# Patient Record
Sex: Male | Born: 1955
Health system: Southern US, Community
[De-identification: ages and names within clinical notes are randomized; demographics above are authoritative.]

## PROBLEM LIST (undated history)

## (undated) DIAGNOSIS — I10 Essential (primary) hypertension: Secondary | ICD-10-CM

## (undated) DIAGNOSIS — R739 Hyperglycemia, unspecified: Secondary | ICD-10-CM

## (undated) DIAGNOSIS — K589 Irritable bowel syndrome without diarrhea: Secondary | ICD-10-CM

## (undated) DIAGNOSIS — N400 Enlarged prostate without lower urinary tract symptoms: Secondary | ICD-10-CM

## (undated) DIAGNOSIS — K219 Gastro-esophageal reflux disease without esophagitis: Secondary | ICD-10-CM

## (undated) DIAGNOSIS — M1990 Unspecified osteoarthritis, unspecified site: Secondary | ICD-10-CM

## (undated) DIAGNOSIS — E785 Hyperlipidemia, unspecified: Secondary | ICD-10-CM

## (undated) DIAGNOSIS — Z87442 Personal history of urinary calculi: Secondary | ICD-10-CM

## (undated) DIAGNOSIS — M549 Dorsalgia, unspecified: Secondary | ICD-10-CM

## (undated) DIAGNOSIS — K5792 Diverticulitis of intestine, part unspecified, without perforation or abscess without bleeding: Secondary | ICD-10-CM

## (undated) DIAGNOSIS — G8929 Other chronic pain: Secondary | ICD-10-CM

## (undated) HISTORY — DX: Benign prostatic hyperplasia without lower urinary tract symptoms: N40.0

## (undated) HISTORY — DX: Other chronic pain: G89.29

## (undated) HISTORY — PX: CHOLECYSTECTOMY: SHX55

## (undated) HISTORY — DX: Hyperglycemia, unspecified: R73.9

## (undated) HISTORY — DX: Essential (primary) hypertension: I10

## (undated) HISTORY — DX: Personal history of urinary calculi: Z87.442

## (undated) HISTORY — DX: Dorsalgia, unspecified: M54.9

## (undated) HISTORY — DX: Irritable bowel syndrome, unspecified: K58.9

## (undated) HISTORY — DX: Diverticulitis of intestine, part unspecified, without perforation or abscess without bleeding: K57.92

## (undated) HISTORY — DX: Hyperlipidemia, unspecified: E78.5

---

## 1988-05-07 HISTORY — PX: VASECTOMY: SHX75

## 1997-05-24 ENCOUNTER — Encounter: Payer: Self-pay | Admitting: Family Medicine

## 1997-05-24 LAB — CONVERTED CEMR LAB: Blood Glucose, Fasting: 100 mg/dL

## 2002-07-13 ENCOUNTER — Encounter: Payer: Self-pay | Admitting: Family Medicine

## 2002-07-13 LAB — CONVERTED CEMR LAB
PSA: 0.6 ng/mL
RBC count: 4.73 10*6/uL
TSH: 1.5 microintl units/mL
WBC, blood: 4.6 10*3/uL

## 2004-08-07 ENCOUNTER — Ambulatory Visit: Payer: Self-pay | Admitting: Family Medicine

## 2004-08-07 LAB — CONVERTED CEMR LAB: TSH: 1.75 microintl units/mL

## 2004-08-09 ENCOUNTER — Ambulatory Visit: Payer: Self-pay | Admitting: Family Medicine

## 2004-08-23 ENCOUNTER — Ambulatory Visit: Payer: Self-pay | Admitting: Gastroenterology

## 2004-09-01 ENCOUNTER — Ambulatory Visit: Payer: Self-pay | Admitting: Gastroenterology

## 2005-02-07 ENCOUNTER — Emergency Department: Payer: Self-pay | Admitting: Emergency Medicine

## 2005-02-07 ENCOUNTER — Encounter: Payer: Self-pay | Admitting: Family Medicine

## 2005-02-07 DIAGNOSIS — Z87442 Personal history of urinary calculi: Secondary | ICD-10-CM

## 2005-02-07 HISTORY — DX: Personal history of urinary calculi: Z87.442

## 2005-02-07 LAB — CONVERTED CEMR LAB: RBC count: 4.64 10*6/uL

## 2005-02-13 ENCOUNTER — Ambulatory Visit: Payer: Self-pay | Admitting: Family Medicine

## 2005-07-09 ENCOUNTER — Ambulatory Visit: Payer: Self-pay | Admitting: Family Medicine

## 2005-07-18 ENCOUNTER — Ambulatory Visit: Payer: Self-pay | Admitting: Family Medicine

## 2005-10-29 ENCOUNTER — Ambulatory Visit: Payer: Self-pay | Admitting: Family Medicine

## 2005-12-20 ENCOUNTER — Ambulatory Visit: Payer: Self-pay | Admitting: Family Medicine

## 2006-04-02 ENCOUNTER — Ambulatory Visit: Payer: Self-pay | Admitting: Family Medicine

## 2006-04-16 ENCOUNTER — Ambulatory Visit: Payer: Self-pay | Admitting: Family Medicine

## 2006-04-16 LAB — CONVERTED CEMR LAB: Blood Glucose, Fasting: 102 mg/dL

## 2006-04-19 ENCOUNTER — Ambulatory Visit: Payer: Self-pay | Admitting: Family Medicine

## 2006-05-09 ENCOUNTER — Ambulatory Visit: Payer: Self-pay | Admitting: Family Medicine

## 2006-12-19 ENCOUNTER — Telehealth (INDEPENDENT_AMBULATORY_CARE_PROVIDER_SITE_OTHER): Payer: Self-pay | Admitting: *Deleted

## 2007-01-01 ENCOUNTER — Ambulatory Visit: Payer: Self-pay | Admitting: Family Medicine

## 2007-01-01 DIAGNOSIS — M545 Low back pain, unspecified: Secondary | ICD-10-CM | POA: Insufficient documentation

## 2007-04-16 ENCOUNTER — Telehealth: Payer: Self-pay | Admitting: Family Medicine

## 2007-04-17 ENCOUNTER — Ambulatory Visit: Payer: Self-pay | Admitting: Family Medicine

## 2007-07-15 ENCOUNTER — Telehealth: Payer: Self-pay | Admitting: Internal Medicine

## 2007-09-09 ENCOUNTER — Telehealth: Payer: Self-pay | Admitting: Family Medicine

## 2007-10-07 ENCOUNTER — Ambulatory Visit: Payer: Self-pay | Admitting: Family Medicine

## 2007-10-07 LAB — CONVERTED CEMR LAB
Bilirubin, Direct: 0.1 mg/dL (ref 0.0–0.3)
Calcium: 9.3 mg/dL (ref 8.4–10.5)
GFR calc Af Amer: 100 mL/min
GFR calc non Af Amer: 83 mL/min
HDL: 35.2 mg/dL — ABNORMAL LOW (ref >39.0)
PSA: 1.02 ng/mL (ref 0.10–4.00)
Sodium: 140 meq/L (ref 135–145)
Total Bilirubin: 0.9 mg/dL (ref 0.3–1.2)
Total CHOL/HDL Ratio: 5.7
Total Protein: 6.8 g/dL (ref 6.0–8.3)
Triglycerides: 64 mg/dL (ref 0–149)
VLDL: 13 mg/dL (ref 0–40)

## 2007-10-08 ENCOUNTER — Encounter: Payer: Self-pay | Admitting: Family Medicine

## 2007-10-08 ENCOUNTER — Ambulatory Visit: Payer: Self-pay | Admitting: Family Medicine

## 2007-10-08 DIAGNOSIS — K5730 Diverticulosis of large intestine without perforation or abscess without bleeding: Secondary | ICD-10-CM | POA: Insufficient documentation

## 2007-10-08 DIAGNOSIS — D126 Benign neoplasm of colon, unspecified: Secondary | ICD-10-CM | POA: Insufficient documentation

## 2007-10-08 DIAGNOSIS — R739 Hyperglycemia, unspecified: Secondary | ICD-10-CM | POA: Insufficient documentation

## 2007-10-24 ENCOUNTER — Ambulatory Visit: Payer: Self-pay | Admitting: Family Medicine

## 2007-11-03 LAB — FECAL OCCULT BLOOD, GUAIAC: Fecal Occult Blood: NEGATIVE

## 2007-11-04 ENCOUNTER — Encounter (INDEPENDENT_AMBULATORY_CARE_PROVIDER_SITE_OTHER): Payer: Self-pay | Admitting: *Deleted

## 2007-11-05 ENCOUNTER — Telehealth: Payer: Self-pay | Admitting: Family Medicine

## 2007-12-17 ENCOUNTER — Telehealth: Payer: Self-pay | Admitting: Family Medicine

## 2008-01-30 ENCOUNTER — Telehealth: Payer: Self-pay | Admitting: Family Medicine

## 2008-02-10 ENCOUNTER — Ambulatory Visit: Payer: Self-pay | Admitting: Family Medicine

## 2008-03-16 ENCOUNTER — Telehealth: Payer: Self-pay | Admitting: Family Medicine

## 2008-05-26 ENCOUNTER — Telehealth: Payer: Self-pay | Admitting: Family Medicine

## 2008-07-28 ENCOUNTER — Telehealth: Payer: Self-pay | Admitting: Family Medicine

## 2008-09-03 ENCOUNTER — Telehealth: Payer: Self-pay | Admitting: Family Medicine

## 2008-10-06 ENCOUNTER — Ambulatory Visit: Payer: Self-pay | Admitting: Family Medicine

## 2008-10-06 DIAGNOSIS — E785 Hyperlipidemia, unspecified: Secondary | ICD-10-CM | POA: Insufficient documentation

## 2008-10-07 ENCOUNTER — Encounter: Payer: Self-pay | Admitting: Family Medicine

## 2008-10-07 LAB — CONVERTED CEMR LAB
ALT: 17 units/L (ref 0–53)
AST: 17 units/L (ref 0–37)
Alkaline Phosphatase: 39 units/L (ref 39–117)
BUN: 16 mg/dL (ref 6–23)
Bilirubin, Direct: 0 mg/dL (ref 0.0–0.3)
Cholesterol: 185 mg/dL (ref 0–200)
Creatinine, Ser: 0.8 mg/dL (ref 0.4–1.5)
Eosinophils Relative: 2 % (ref 0.0–5.0)
GFR calc non Af Amer: 106.64 mL/min (ref >60)
HCT: 42.4 % (ref 39.0–52.0)
LDL Cholesterol: 131 mg/dL — ABNORMAL HIGH (ref 0–99)
Monocytes Relative: 8.2 % (ref 3.0–12.0)
Neutrophils Relative %: 51.2 % (ref 43.0–77.0)
Platelets: 213 10*3/uL (ref 150.0–400.0)
Potassium: 4.1 meq/L (ref 3.5–5.1)
Total Bilirubin: 0.9 mg/dL (ref 0.3–1.2)
VLDL: 14.2 mg/dL (ref 0.0–40.0)
WBC: 5.7 10*3/uL (ref 4.5–10.5)

## 2008-10-14 ENCOUNTER — Ambulatory Visit: Payer: Self-pay | Admitting: Family Medicine

## 2008-10-14 DIAGNOSIS — Z87898 Personal history of other specified conditions: Secondary | ICD-10-CM | POA: Insufficient documentation

## 2008-11-22 ENCOUNTER — Emergency Department: Payer: Self-pay | Admitting: Emergency Medicine

## 2008-11-23 ENCOUNTER — Telehealth: Payer: Self-pay | Admitting: Family Medicine

## 2008-11-23 DIAGNOSIS — N200 Calculus of kidney: Secondary | ICD-10-CM | POA: Insufficient documentation

## 2008-11-24 ENCOUNTER — Encounter: Payer: Self-pay | Admitting: Family Medicine

## 2008-12-01 ENCOUNTER — Ambulatory Visit: Payer: Self-pay | Admitting: Family Medicine

## 2008-12-17 ENCOUNTER — Encounter: Payer: Self-pay | Admitting: Family Medicine

## 2008-12-27 ENCOUNTER — Ambulatory Visit: Payer: Self-pay | Admitting: General Surgery

## 2008-12-27 ENCOUNTER — Encounter: Payer: Self-pay | Admitting: Family Medicine

## 2008-12-27 HISTORY — PX: LAPAROSCOPIC CHOLECYSTECTOMY W/ CHOLANGIOGRAPHY: SUR757

## 2009-01-11 ENCOUNTER — Encounter: Payer: Self-pay | Admitting: Family Medicine

## 2009-02-24 ENCOUNTER — Telehealth: Payer: Self-pay | Admitting: Family Medicine

## 2009-03-28 ENCOUNTER — Ambulatory Visit: Payer: Self-pay | Admitting: Family Medicine

## 2009-04-07 ENCOUNTER — Telehealth: Payer: Self-pay | Admitting: Family Medicine

## 2009-04-27 ENCOUNTER — Telehealth: Payer: Self-pay | Admitting: Family Medicine

## 2009-04-27 ENCOUNTER — Ambulatory Visit: Payer: Self-pay | Admitting: Family Medicine

## 2009-05-30 ENCOUNTER — Telehealth: Payer: Self-pay | Admitting: Family Medicine

## 2009-06-21 ENCOUNTER — Telehealth: Payer: Self-pay | Admitting: Family Medicine

## 2009-07-26 ENCOUNTER — Telehealth: Payer: Self-pay | Admitting: Internal Medicine

## 2009-08-16 ENCOUNTER — Encounter (INDEPENDENT_AMBULATORY_CARE_PROVIDER_SITE_OTHER): Payer: Self-pay | Admitting: *Deleted

## 2009-08-31 ENCOUNTER — Telehealth: Payer: Self-pay | Admitting: Family Medicine

## 2009-09-02 ENCOUNTER — Encounter (INDEPENDENT_AMBULATORY_CARE_PROVIDER_SITE_OTHER): Payer: Self-pay | Admitting: *Deleted

## 2009-09-26 ENCOUNTER — Telehealth: Payer: Self-pay | Admitting: Family Medicine

## 2009-09-28 ENCOUNTER — Encounter: Payer: Self-pay | Admitting: Family Medicine

## 2009-10-10 ENCOUNTER — Ambulatory Visit: Payer: Self-pay | Admitting: Family Medicine

## 2009-10-11 LAB — CONVERTED CEMR LAB
Alkaline Phosphatase: 43 units/L (ref 39–117)
Basophils Relative: 0.9 % (ref 0.0–3.0)
Bilirubin, Direct: 0.1 mg/dL (ref 0.0–0.3)
CO2: 30 meq/L (ref 19–32)
Calcium: 9 mg/dL (ref 8.4–10.5)
Cholesterol: 201 mg/dL — ABNORMAL HIGH (ref 0–200)
Creatinine, Ser: 0.8 mg/dL (ref 0.4–1.5)
Direct LDL: 141.3 mg/dL
Eosinophils Absolute: 0.1 10*3/uL (ref 0.0–0.7)
Lymphocytes Relative: 33.7 % (ref 12.0–46.0)
MCHC: 34.4 g/dL (ref 30.0–36.0)
Neutrophils Relative %: 56.3 % (ref 43.0–77.0)
Platelets: 240 10*3/uL (ref 150.0–400.0)
RBC: 4.76 M/uL (ref 4.22–5.81)
Total Bilirubin: 0.6 mg/dL (ref 0.3–1.2)
Total CHOL/HDL Ratio: 5
Total Protein: 6.7 g/dL (ref 6.0–8.3)
Triglycerides: 73 mg/dL (ref 0.0–149.0)
VLDL: 14.6 mg/dL (ref 0.0–40.0)
WBC: 6.1 10*3/uL (ref 4.5–10.5)

## 2009-10-13 ENCOUNTER — Encounter (INDEPENDENT_AMBULATORY_CARE_PROVIDER_SITE_OTHER): Payer: Self-pay | Admitting: *Deleted

## 2009-10-17 ENCOUNTER — Ambulatory Visit: Payer: Self-pay | Admitting: Family Medicine

## 2009-10-17 ENCOUNTER — Ambulatory Visit: Payer: Self-pay | Admitting: Gastroenterology

## 2009-10-17 LAB — CONVERTED CEMR LAB
Creatinine,U: 88.2 mg/dL
Microalb Creat Ratio: 1.1 mg/g (ref 0.0–30.0)
Microalb, Ur: 1 mg/dL (ref 0.0–1.9)

## 2009-10-24 LAB — HM COLONOSCOPY

## 2009-10-28 ENCOUNTER — Telehealth: Payer: Self-pay | Admitting: Family Medicine

## 2009-10-31 ENCOUNTER — Ambulatory Visit: Payer: Self-pay | Admitting: Gastroenterology

## 2009-11-01 ENCOUNTER — Encounter: Payer: Self-pay | Admitting: Gastroenterology

## 2009-11-28 ENCOUNTER — Telehealth: Payer: Self-pay | Admitting: Family Medicine

## 2009-12-12 ENCOUNTER — Encounter (INDEPENDENT_AMBULATORY_CARE_PROVIDER_SITE_OTHER): Payer: Self-pay | Admitting: *Deleted

## 2009-12-30 ENCOUNTER — Telehealth: Payer: Self-pay | Admitting: Family Medicine

## 2010-01-26 ENCOUNTER — Telehealth: Payer: Self-pay | Admitting: Family Medicine

## 2010-02-27 ENCOUNTER — Telehealth: Payer: Self-pay | Admitting: Family Medicine

## 2010-03-27 ENCOUNTER — Telehealth: Payer: Self-pay | Admitting: Family Medicine

## 2010-04-24 ENCOUNTER — Telehealth: Payer: Self-pay | Admitting: Family Medicine

## 2010-05-24 ENCOUNTER — Ambulatory Visit
Admission: RE | Admit: 2010-05-24 | Discharge: 2010-05-24 | Payer: Self-pay | Source: Home / Self Care | Attending: Family Medicine | Admitting: Family Medicine

## 2010-05-24 DIAGNOSIS — K5732 Diverticulitis of large intestine without perforation or abscess without bleeding: Secondary | ICD-10-CM | POA: Insufficient documentation

## 2010-06-06 NOTE — Progress Notes (Signed)
Summary: OXYCODONE-ACETAMINOPHEN  Phone Note Call from Patient   Caller: Patient Summary of Call: Patient needs his written RX for oxycodone pain med he gets this every month from Dr Hetty Ely, he has 3 pills left right now. Patients cell number #[phone]. Please call him when ready to pick up or questions.  Initial call taken by: Carlton Adam,  July 26, 2009 8:57 AM  Follow-up for Phone Call        Rx written Follow-up by: Cindee Salt MD,  July 26, 2009 9:31 AM  Additional Follow-up for Phone Call Additional follow up Details #1::        Spoke with patient and advised rx ready for pick-up  Additional Follow-up by: Mervin Hack CMA [name]),  July 26, 2009 9:43 AM    New/Updated Medications: OXYCODONE-ACETAMINOPHEN 5-325 MG TABS (OXYCODONE-ACETAMINOPHEN) 1 by mouth every 4-6 hours as needed pain Prescriptions: OXYCODONE-ACETAMINOPHEN 5-325 MG TABS (OXYCODONE-ACETAMINOPHEN) 1 by mouth every 4-6 hours as needed pain  #30 x 0   Entered and Authorized by:   Cindee Salt MD   Signed by:   Cindee Salt MD on 07/26/2009   Method used:   Print then Give to Patient   RxID:   [id number]

## 2010-06-06 NOTE — Progress Notes (Signed)
Summary: oxycodone and soma  Phone Note Refill Request Message from:  Patient on Sep 26, 2009 10:54 AM  Refills Requested: Medication #1:  OXYCODONE-ACETAMINOPHEN 5-325 MG TABS 1 by mouth every 4-6 hours as needed pain  Medication #2:  SOMA 350 MG TABS one tab by mouth three times a day for 1-2 weeks then as needed.   Dosage confirmed as above?Dosage Confirmed  Method Requested: Pick up at Office Initial call taken by: Melody Comas,  Sep 26, 2009 10:54 AM  Follow-up for Phone Call        Patient advised that rx are in front office ready for pick up.  Follow-up by: Melody Comas,  Sep 26, 2009 11:05 AM    Prescriptions: SOMA 350 MG TABS (CARISOPRODOL) one tab by mouth three times a day for 1-2 weeks then as needed  #50 x 0   Entered and Authorized by:   Shaune Leeks MD   Signed by:   Shaune Leeks MD on 09/26/2009   Method used:   Print then Give to Patient   RxID:   [id number] OXYCODONE-ACETAMINOPHEN 5-325 MG TABS (OXYCODONE-ACETAMINOPHEN) 1 by mouth every 4-6 hours as needed pain  #30 x 0   Entered and Authorized by:   Shaune Leeks MD   Signed by:   Shaune Leeks MD on 09/26/2009   Method used:   Print then Give to Patient   RxID:   [id number]

## 2010-06-06 NOTE — Progress Notes (Signed)
Summary: refill request for oxycodone  Phone Note Refill Request Call back at [phone] Message from:  Patient  Refills Requested: Medication #1:  OXYCODONE-ACETAMINOPHEN 5-325 MG TABS 1 by mouth every 4-6 hours as needed pain Please call pt when ready.  Initial call taken by: Lowella Petties CMA,  January 26, 2010 10:04 AM  Follow-up for Phone Call        have patient pick up.  Follow-up by: Crawford Givens MD,  January 26, 2010 10:18 AM  Additional Follow-up for Phone Call Additional follow up Details #1::        Patient Advised. Prescription left at front desk.  Additional Follow-up by: Delilah Shan CMA [name]),  January 26, 2010 10:29 AM    Prescriptions: OXYCODONE-ACETAMINOPHEN 5-325 MG TABS (OXYCODONE-ACETAMINOPHEN) 1 by mouth every 4-6 hours as needed pain  #30 x 0   Entered and Authorized by:   Crawford Givens MD   Signed by:   Crawford Givens MD on 01/26/2010   Method used:   Print then Give to Patient   RxID:   [id number]

## 2010-06-06 NOTE — Letter (Signed)
Summary: Dr.Sigmund Tannenbaum,[facility],Note  Dr.Sigmund Tannenbaum,[facility],Note   Imported By: Beau Fanny 09/29/2009 10:24:26  _____________________________________________________________________  External Attachment:    Type:   Image     Comment:   External Document

## 2010-06-06 NOTE — Letter (Signed)
Summary: Previsit letter  [facility]  [address] [place], [place] [address]   Phone: [phone]  Fax: [phone]       09/02/2009 MRN: [id number]  Stephen Hanna [address] [place], [place]  [address]  Dear [name],  Welcome to the Gastroenterology Division at [facility].    You are scheduled to see a nurse for your pre-procedure visit on 10-17-09 at 11:00a.m. on the 3rd floor at [facility], [address]  We ask that you try to arrive at our office 15 minutes prior to your appointment time to allow for check-in.  Your nurse visit will consist of discussing your medical and surgical history, your immediate family medical history, and your medications.    Please bring a complete list of all your medications or, if you prefer, bring the medication bottles and we will list them.  We will need to be aware of both prescribed and over the counter drugs.  We will need to know exact dosage information as well.  If you are on blood thinners (Coumadin, Plavix, Aggrenox, Ticlid, etc.) please call our office today/prior to your appointment, as we need to consult with your physician about holding your medication.   Please be prepared to read and sign documents such as consent forms, a financial agreement, and acknowledgement forms.  If necessary, and with your consent, a friend or relative is welcome to sit-in on the nurse visit with you.  Please bring your insurance card so that we may make a copy of it.  If your insurance requires a referral to see a specialist, please bring your referral form from your primary care physician.  No co-pay is required for this nurse visit.     If you cannot keep your appointment, please call [phone] to cancel or reschedule prior to your appointment date.  This allows [place] the opportunity to schedule an appointment for another patient in need of care.    Thank you for choosing  Gastroenterology for your medical  needs.  We appreciate the opportunity to care for you.  Please visit [place] at our website  to learn more about our practice.                     Sincerely.                                                                                                                   The Gastroenterology Division

## 2010-06-06 NOTE — Letter (Signed)
Summary: Patient Notice-Hyperplastic Polyps  [facility]  [address] [place], [place] [address]   Phone: [phone]  Fax: [phone]        November 01, 2009 MRN: [id number]    Stephen Hanna [address] [place], [place]  [address]    Dear [name],  I am pleased to inform you that the colon polyp(s) removed during your recent colonoscopy was (were) found to be hyperplastic. These types of polyps are NOT pre-cancerous.  It is my recommendation that you have a repeat colonoscopy examination in 5 years.  Should you develop new or worsening symptoms of abdominal pain, bowel habit changes or bleeding from the rectum or bowels, please schedule an evaluation with either your primary care physician or with me.  Continue treatment plan as outlined the day of your exam.  Please call us if you are having persistent problems or have questions about your condition that have not been fully answered at this time.  Sincerely,  Meryl Dare MD [facility]  This letter has been electronically signed by your physician.  Appended Document: Patient Notice-Hyperplastic Polyps letter mailed 7.6.11

## 2010-06-06 NOTE — Letter (Signed)
Summary: Nadara Eaton letter  [place] [facility]  [address] [place], [place] [address]   Phone: [phone]  Fax: [phone]       12/12/2009 MRN: [id number]  KATO WIECZOREK [address] [place], [place]  [address]  Dear Mr. [facility] [place] [place] announce the retirement of Arta Silence, M.D., from full-time practice at the [facility] office effective November 03, 2009 and his plans of returning part-time.  It is important to Dr. Hetty Ely and to our practice that you understand that [facility] has seven physicians in our office for your health care needs.  We will continue to offer the same exceptional care that you have today.    Dr. Hetty Ely has spoken to many of you about his plans for retirement and returning part-time in the fall.   We will continue to work with you through the transition to schedule appointments for you in the office and meet the high standards that [facility] is committed to.   Again, it is with great pleasure that we share the news that Dr. Hetty Ely will return to [facility] in October of 2011 with a reduced schedule.    If you have any questions, or would like to request an appointment with one of our physicians, please call us at [phone] and press the option for Scheduling an appointment.  We take pleasure in providing you with excellent patient care and look forward to seeing you at your next office visit.  Our [facility] Physicians are:  Tillman Abide, M.D. Laurita Quint, M.D. Roxy Manns, M.D. Kerby Nora, M.D. Hannah Beat, M.D. Ruthe Mannan, M.D. We proudly welcomed Raechel Ache, M.D. and Eustaquio Boyden, M.D. to the practice in July/August 2011.  Sincerely,  [facility]

## 2010-06-06 NOTE — Progress Notes (Signed)
Summary: oxycodone  Phone Note Refill Request Message from:  Patient on May 30, 2009 12:52 PM  Refills Requested: Medication #1:  OXYCODONE-ACETAMINOPHEN 5-325 MG TABS 1 by mouth q 4-6 hours as needed pain   Supply Requested: 1 month patient will pick up when ready [phone]   Method Requested: Pick up at Office Initial call taken by: Benny Lennert CMA [name]),  May 30, 2009 12:53 PM  Follow-up for Phone Call        Patient notified that rx is up front and ready for pickup. Follow-up by: Sydell Axon LPN,  May 30, 2009 4:04 PM    New/Updated Medications: OXYCODONE-ACETAMINOPHEN 5-325 MG TABS (OXYCODONE-ACETAMINOPHEN) 1 by mouth every 4-6 hours as needed pain Prescriptions: OXYCODONE-ACETAMINOPHEN 5-325 MG TABS (OXYCODONE-ACETAMINOPHEN) 1 by mouth q 4-6 hours as needed pain  #30 x 0   Entered and Authorized by:   Shaune Leeks MD   Signed by:   Shaune Leeks MD on 05/30/2009   Method used:   Print then Give to Patient   RxID:   [id number]

## 2010-06-06 NOTE — Progress Notes (Signed)
Summary: refill request for oxycodone  Phone Note Refill Request Call back at [phone] Message from:  Patient  Refills Requested: Medication #1:  OXYCODONE-ACETAMINOPHEN 5-325 MG TABS 1 by mouth every 4-6 hours as needed pain Please call pt when ready.  Initial call taken by: Lowella Petties CMA, AAMA,  February 27, 2010 12:07 PM  Follow-up for Phone Call        signed.  Follow-up by: Crawford Givens MD,  February 27, 2010 1:31 PM  Additional Follow-up for Phone Call Additional follow up Details #1::        Patient Advised. Prescription left at front desk.  Additional Follow-up by: Delilah Shan CMA [name]),  February 27, 2010 2:27 PM    Prescriptions: OXYCODONE-ACETAMINOPHEN 5-325 MG TABS (OXYCODONE-ACETAMINOPHEN) 1 by mouth every 4-6 hours as needed pain  #30 x 0   Entered and Authorized by:   Crawford Givens MD   Signed by:   Crawford Givens MD on 02/27/2010   Method used:   Print then Give to Patient   RxID:   [id number]

## 2010-06-06 NOTE — Assessment & Plan Note (Signed)
Summary: CPX/CLE   Vital Signs:  Patient profile:   55 year old male Weight:      203.25 pounds Temp:     97.9 degrees F oral Pulse rate:   64 / minute Pulse rhythm:   regular BP sitting:   110 / 74  (left arm) Cuff size:   large  Vitals Entered By: Sydell Axon LPN (03-Nov-2009 9:12 AM) CC: 30 Minute checkup, had a colonoscopy 04/06 by Dr. Russella Dar, scheduled for another colonoscopy on 10/24/09    History of Present Illness: Pt here for Comp Exam. He has colonoscopy scheduled for next week.  He has trouble with needing to have BM frequently...he often goes and then needs to repeat, sometimes in , sometimes w/o warning.  He saw Dr Patsi Sears recently and PSA and prostate test nml. Try 4 oz of water with tsp or Citrucel for the diarrhea.  His right knee tends to pop a lot.  He wakes up occas with cramps in the legs. Discussed Jogging in a jug or mustard for cramping.  Preventive Screening-Counseling & Management  Alcohol-Tobacco     Alcohol drinks/day: <1     Alcohol type: beers 2 per week.     Smoking Status: current     Smoking Cessation Counseling: yes     Packs/Day: pack a week     Cans of tobacco/week: no     Passive Smoke Exposure: no  Caffeine-Diet-Exercise     Caffeine use/day: 2     Does Patient Exercise: no     Type of exercise: works hard daily, works on Paediatric nurse.  Problems Prior to Update: 1)  Other Malaise and Fatigue  (ICD-780.79) 2)  Abdominal Pain Right Upper Quadrant  (ICD-789.01) 3)  Renal Calculus, Recurrent  (ICD-592.0) 4)  Benign Prostatic Hypertrophy, Mild, Hx of  (ICD-V13.8) 5)  Other and Unspecified Hyperlipidemia  (ICD-272.4) 6)  Special Screening Malig Neoplasms Other Sites  (ICD-V76.49) 7)  Trochanteric Bursitis, Left  (ICD-726.5) 8)  Hyperglycemia  (ICD-790.29) 9)  Diverticulosis of Colon  (ICD-562.10) 10)  Colonic Polyps  (ICD-211.3) 11)  Special Screening Malignant Neoplasm of Prostate  (ICD-V76.44) 12)  Health Maintenance Exam   (ICD-V70.0) 13)  Back Pain, Lumbar  (ICD-724.2)  Medications Prior to Update: 1)  Proair Hfa 108 (90 Base) Mcg/act  Aers (Albuterol Sulfate) .... 2 Inh Q4h As Needed Shortness of Breath 2)  Oxycodone-Acetaminophen 5-325 Mg Tabs (Oxycodone-Acetaminophen) .Marland Kitchen.. 1 By Mouth Every 4-6 Hours As Needed Pain 3)  Soma 350 Mg Tabs (Carisoprodol) .... One Tab By Mouth Three Times A Day For 1-2 Weeks Then As Needed  Allergies: 1)  ! [name]  Past History:  Past Surgical History: Last updated: 12/27/2008 VASECTOMY 1990 CARDIOLITE NORMAL EF 53%:(08/20/2002) CT ABD  W/O  NEGATIVE STONES +MULTIPLE  AORTIC L.N.:(07/24/2004) COLONOSCOPY POLYPS B-9 DIVERTICUS --REPEAT IN 2011:(09/01/2004) CT  ABD/PELVIS --DIVERTICULITIS SIG LEFT RENAL STONE:(02/07/2005) CT ABD/PELVIS W AND W/O NO GALLSTONES  MILD DILATION ENTIRE RIGHT URETER LAP CHOLEY (DR BYRNETT) (12/27/2008)  Family History: Last updated: 11/03/09 Father: DECEASED AT 38 WITH MI  Mother: dec 4/11 Kyphosis   BIOLOGIC MOTHER STOMACH ANEURSYM LOST WEIGHT //DOESN'T SEE DOCTOR BROTHER 1/2 A  60s Brother 1/2 A 61  SISTER 1/2 A 62 SISTER  A 60 CV: + MI, FATHER DECEASED  HBP: NEGATIVE DM: NEGATIVE GOUT/ARTHRITIS: PROSTATE CANCER: BREAST/OVARIAN/UTERINE CANCER: NEGATIVE COLON CANCER: DEPRESSION: + GM ETOH/DRUG ABUSE: NEGATIVE OTHER: NEGATIVE STROKE  Social History: Last updated: 10/08/2007 Marital Status: MarriedLIVES WITH WIFE  Children:  2 SONS Occupation: RETIRED FROM [employer]// PAINTS HOUSES NOW  Risk Factors: Alcohol Use: <1 (10/17/2009) Caffeine Use: 2 (10/17/2009) Exercise: no (10/17/2009)  Risk Factors: Smoking Status: current (10/17/2009) Packs/Day: pack a week (10/17/2009) Cans of tobacco/wk: no (10/17/2009) Passive Smoke Exposure: no (10/17/2009)  Family History: Father: DECEASED AT 6 WITH MI  Mother: dec 4/11 Kyphosis   BIOLOGIC MOTHER STOMACH ANEURSYM LOST WEIGHT //DOESN'T SEE DOCTOR BROTHER 1/2 A  60s Brother 1/2  A 61  SISTER 1/2 A 62 SISTER  A 60 CV: + MI, FATHER DECEASED  HBP: NEGATIVE DM: NEGATIVE GOUT/ARTHRITIS: PROSTATE CANCER: BREAST/OVARIAN/UTERINE CANCER: NEGATIVE COLON CANCER: DEPRESSION: + GM ETOH/DRUG ABUSE: NEGATIVE OTHER: NEGATIVE STROKE  Review of Systems General:  Complains of sweats; denies chills, fatigue, fever, weakness, and weight loss; occas night . Eyes:  Denies blurring, discharge, and eye pain. ENT:  Denies decreased hearing, ear discharge, earache, and ringing in ears. CV:  Denies chest pain or discomfort, fainting, fatigue, palpitations, shortness of breath with exertion, swelling of feet, and swelling of hands. Resp:  Denies cough, shortness of breath, and wheezing. GI:  Complains of abdominal pain and diarrhea; denies bloody stools, change in bowel habits, constipation, dark tarry stools, indigestion, loss of appetite, nausea, vomiting, vomiting blood, and yellowish skin color; occas abd pain, frequent diarrhea. GU:  Complains of urinary frequency; denies discharge, dysuria, and nocturia; occas with increased urgency. MS:  Complains of joint pain, low back pain, and cramps; see HPI. Derm:  Complains of dryness; denies itching and rash. Neuro:  Denies numbness, poor balance, tingling, and tremors.  Physical Exam  General:  Well-developed,well-nourished,in no acute distress; alert,appropriate and cooperative throughout examination. Head:  Normocephalic and atraumatic without obvious abnormalities. No apparent alopecia or balding. Sinuses NT. Eyes:  Conjunctiva clear bilaterally.  Ears:  External ear exam shows no significant lesions or deformities.  Otoscopic examination reveals clear canals, tympanic membranes are intact bilaterally without bulging, retraction, inflammation or discharge. Hearing is grossly normal bilaterally. Nose:  External nasal examination shows no deformity or inflammation. Nasal mucosa are pink and moist without lesions or exudates. Mouth:   Oral mucosa and oropharynx without lesions or exudates.  Teeth in good repair. Neck:  No deformities, masses, or tenderness noted. Chest Wall:  No deformities, masses, tenderness or gynecomastia noted. Breasts:  No masses or gynecomastia noted Lungs:  Normal respiratory effort, chest expands symmetrically. Lungs are clear to auscultation, no crackles or wheezes. Heart:  Normal rate and regular rhythm. S1 and S2 normal without gallop, murmur, click, rub or other extra sounds. Abdomen:  Bowel sounds positive,abdomen soft and non-tender without masses, organomegaly or hernias noted except for linearity and mild tenderness deep to Cullenn's point. Liver percusses nml size. Rectal:  Not done, due colonoscopy in one week. Genitalia:  Not done, just saw Dr Patsi Sears last week. Prostate:  Not done, just saw Dr Patsi Sears last week. Msk:  No deformity or scoliosis noted of thoracic or lumbar spine.  Chronic pain of R lower back not as bad today as it is often. Minimally tender top palp just above R PSIS. Pulses:  R and L carotid,radial,femoral,dorsalis pedis and posterior tibial pulses are full and equal bilaterally Extremities:  No clubbing, cyanosis, edema, or deformity noted with normal full range of motion of all joints.   Neurologic:  No cranial nerve deficits noted. Station and gait are normal. Sensory, motor and coordinative functions appear intact. Skin:  Intact without suspicious lesions or rashes Cervical Nodes:  No lymphadenopathy noted Inguinal Nodes:  No significant adenopathy Psych:  Cognition and judgment appear intact. Alert and cooperative with normal attention span and concentration. No apparent delusions, illusions, hallucinations   Impression & Recommendations:  Problem # 1:  HEALTH MAINTENANCE EXAM (ICD-V70.0)  Reviewed preventive care protocols, scheduled due services, and updated immunizations. Due colonoscopy next week.  Problem # 2:  RENAL CALCULUS, RECURRENT  (ICD-592.0) Assessment: Unchanged Sxs come and go. Recently passed another stone, seen by Dr Patsi Sears.  Problem # 3:  OTHER AND UNSPECIFIED HYPERLIPIDEMIA (ICD-272.4) Assessment: Unchanged LDL slightly elevated for him. Warned to watch fatty foods. Labs Reviewed: SGOT: 15 (10/10/2009)   SGPT: 17 (10/10/2009)   HDL:42.50 (10/10/2009), 40.00 (10/06/2008)  LDL:131 (10/06/2008), DEL (10/07/2007)  Chol:201 (10/10/2009), 185 (10/06/2008)  Trig:73.0 (10/10/2009), 71.0 (10/06/2008)  Problem # 4:  BENIGN PROSTATIC HYPERTROPHY, MILD, HX OF (ICD-V13.8) Assessment: Unchanged Per Dr Patsi Sears. He is having occas urgency. Allow Dr T to treat.  Problem # 5:  HYPERGLYCEMIA (ICD-790.29) Assessment: Unchanged  107 this year, 100 last. Encouraged to be very careful wiith sweets and carbs. Give up the [facility]!!! Orders: TLB-Microalbumin/Creat Ratio, Urine (82043-MALB)  Labs Reviewed: Creat: 0.8 (10/10/2009)     Problem # 6:  DIVERTICULOSIS OF COLON (ICD-562.10) Assessment: Unchanged  Come in for prolonged LLQ abd pain.  Colonoscopy:  Labs Reviewed: Hgb: 14.4 (10/10/2009)   Hct: 41.8 (10/10/2009)   WBC: 6.1 (10/10/2009)  Problem # 7:  COLONIC POLYPS (ICD-211.3) Assessment: Unchanged To be rechecked next week.  Problem # 8:  SPECIAL SCREENING MALIGNANT NEOPLASM OF PROSTATE (ICD-V76.44) Assessment: Unchanged Stable PSA and exam per Dr Patsi Sears.  Problem # 9:  BACK PAIN, LUMBAR (ICD-724.2) Assessment: Unchanged  Seems stable. Uses Oxycodone with acceleration. Will write script next week when due. His updated medication list for this problem includes:    Oxycodone-acetaminophen 5-325 Mg Tabs (Oxycodone-acetaminophen) .Marland Kitchen... 1 by mouth every 4-6 hours as needed pain    Soma 350 Mg Tabs (Carisoprodol) ..... One tab by mouth three times a day for 1-2 weeks then as needed  Complete Medication List: 1)  Oxycodone-acetaminophen 5-325 Mg Tabs (Oxycodone-acetaminophen) .Marland Kitchen.. 1 by mouth  every 4-6 hours as needed pain 2)  Soma 350 Mg Tabs (Carisoprodol) .... One tab by mouth three times a day for 1-2 weeks then as needed  Patient Instructions: 1)  RTC 6 mos, either Dr Para March or Sharen Hones.   Current Allergies (reviewed today): ! [name]

## 2010-06-06 NOTE — Miscellaneous (Signed)
Summary: [facility]  Clinical Lists Changes  Medications: Added new medication of MOVIPREP 100 GM  SOLR (PEG-KCL-NACL-NASULF-NA ASC-C) As per prep instructions. - Signed Rx of MOVIPREP 100 GM  SOLR (PEG-KCL-NACL-NASULF-NA ASC-C) As per prep instructions.;  #1 x 0;  Signed;  Entered by: Ezra Sites RN;  Authorized by: Meryl Dare MD [facility];  Method used: [facility]  [place] [place], [place]  [address], Ph: [phone] or [phone], Fax: [phone] Observations: Added new observation of ALLERGY REV: Done (10/17/2009 10:38)    Prescriptions: MOVIPREP 100 GM  SOLR (PEG-KCL-NACL-NASULF-NA ASC-C) As per prep instructions.  #1 x 0   Entered by:   Ezra Sites RN   Authorized by:   Meryl Dare MD [facility]   Signed by:   Ezra Sites RN on 10/17/2009   Method used:   [facility]        [facility]  [place] (retail)       [address]       [place], [place]  [address]       Ph: [phone] or [phone]       Fax: [phone]   RxID:   [id number]

## 2010-06-06 NOTE — Procedures (Signed)
Summary: Colonoscopy  Patient: Stephen Hanna Note: All result statuses are Final unless otherwise noted.  Tests: (1) Colonoscopy (COL)   COL Colonoscopy           DONE     [facility]     [address]     [place], [place]  [address]           COLONOSCOPY PROCEDURE REPORT           PATIENT:  Stephen Hanna, Stephen Hanna  MR#:  [id number]     BIRTHDATE:  13-Jun-1955, 54 yrs. old  GENDER:  male     ENDOSCOPIST:  Judie Petit T. Russella Dar, MD, [facility]           PROCEDURE DATE:  10/31/2009     PROCEDURE:  Colonoscopy with snare polypectomy     ASA CLASS:  Class II     INDICATIONS:  1) follow-up of polyp, surveillance and high-risk     screening, adenomatous polyp, 08/2004.     MEDICATIONS:   Fentanyl 100 mcg IV, Versed 10 mg IV     DESCRIPTION OF PROCEDURE:   After the risks benefits and     alternatives of the procedure were thoroughly explained, informed     consent was obtained.  Digital rectal exam was performed and     revealed no abnormalities.   The LB [id number]-[id number] endoscope     was introduced through the anus and advanced to the cecum, which     was identified by both the appendix and ileocecal valve, without     limitations.  The quality of the prep was good, using MoviPrep.     The instrument was then slowly withdrawn as the colon was fully     examined.     <<PROCEDUREIMAGES>>     FINDINGS:  A sessile polyp was found in the ascending colon. It     was 5 mm in size. Polyp was snared without cautery. Retrieval was     successful.   Mild diverticulosis was found at the hepatic     flexure.  Moderate diverticulosis was found in the sigmoid to     descending colon.  This was otherwise a normal examination of the     colon.   Retroflexed views in the rectum revealed no     abnormalities.  The time to cecum =  4  minutes. The scope was     then withdrawn (time =  12.33  min) from the patient and the     procedure completed.           COMPLICATIONS:  None           ENDOSCOPIC  IMPRESSION:     1) 5 mm sessile polyp in the ascending colon     2) Mild diverticulosis at the hepatic flexure     3) Moderate diverticulosis in the sigmoid to descending colon           RECOMMENDATIONS:     1) Await pathology results     2) High fiber diet with liberal fluid intake.     3) Repeat Colonoscopy in 5 years pending pathology review           Mandeep Ferch T. Russella Dar, MD, [facility]           n.     eSIGNED:   Venita Lick. Tim Corriher at 10/31/2009 09:55 AM           Thurmon Fair, [id number]  Note: An exclamation  mark (!) indicates a result that was not dispersed into the flowsheet. Document Creation Date: 10/31/2009 9:57 AM _______________________________________________________________________  (1) Order result status: Final Collection or observation date-time: 10/31/2009 09:51 Requested date-time:  Receipt date-time:  Reported date-time:  Referring Physician:   Ordering Physician: Claudette Head [phone]) Specimen Source:  Source: Launa Grill Order Number: [id number] Lab site:   Appended Document: Colonoscopy     Procedures Next Due Date:    Colonoscopy: 10/2014

## 2010-06-06 NOTE — Progress Notes (Signed)
Summary: oxycodone  Phone Note Refill Request Call back at [phone] Message from:  Patient on December 30, 2009 10:19 AM  Refills Requested: Medication #1:  OXYCODONE-ACETAMINOPHEN 5-325 MG TABS 1 by mouth every 4-6 hours as needed pain  Method Requested: Pick up at Office Initial call taken by: Melody Comas,  December 30, 2009 10:19 AM  Follow-up for Phone Call        Rx signed.  thanks.  Follow-up by: Crawford Givens MD,  December 30, 2009 11:54 AM  Additional Follow-up for Phone Call Additional follow up Details #1::        Patient Advised. Prescription left at front desk.  Additional Follow-up by: Delilah Shan CMA [name]),  December 30, 2009 12:03 PM    Prescriptions: OXYCODONE-ACETAMINOPHEN 5-325 MG TABS (OXYCODONE-ACETAMINOPHEN) 1 by mouth every 4-6 hours as needed pain  #30 x 0   Entered and Authorized by:   Crawford Givens MD   Signed by:   Crawford Givens MD on 12/30/2009   Method used:   Print then Give to Patient   RxID:   [id number]

## 2010-06-06 NOTE — Progress Notes (Signed)
Summary: wants refil on oxycodone  Phone Note Refill Request Call back at [phone] Message from:  Patient  Refills Requested: Medication #1:  OXYCODONE-ACETAMINOPHEN 5-325 MG TABS 1 by mouth every 4-6 hours as needed pain Pt wants to pick this up around 2 today.  Initial call taken by: Lowella Petties CMA,  June 21, 2009 8:18 AM  Follow-up for Phone Call        Script placed up front for pick up. Follow-up by: Lowella Petties CMA,  June 21, 2009 10:45 AM    Prescriptions: OXYCODONE-ACETAMINOPHEN 5-325 MG TABS (OXYCODONE-ACETAMINOPHEN) 1 by mouth every 4-6 hours as needed pain  #30 x 0   Entered and Authorized by:   Shaune Leeks MD   Signed by:   Shaune Leeks MD on 06/21/2009   Method used:   Print then Give to Patient   RxID:   [id number]

## 2010-06-06 NOTE — Progress Notes (Signed)
Summary: refill request for oxycodone  Phone Note Refill Request Call back at [phone] Message from:  Patient  Refills Requested: Medication #1:  OXYCODONE-ACETAMINOPHEN 5-325 MG TABS 1 by mouth every 4-6 hours as needed pain Please call pt when ready.  Initial call taken by: Lowella Petties CMA,  November 28, 2009 8:29 AM  Follow-up for Phone Call        okay to fill, same sig, no rf.  Follow-up by: Crawford Givens MD,  November 28, 2009 1:10 PM  Additional Follow-up for Phone Call Additional follow up Details #1::        Medication phoned to pharmacy.  Additional Follow-up by: Delilah Shan CMA Crecencio Kwiatek Dull),  November 28, 2009 2:40 PM    Prescriptions: OXYCODONE-ACETAMINOPHEN 5-325 MG TABS (OXYCODONE-ACETAMINOPHEN) 1 by mouth every 4-6 hours as needed pain  #30 x 0   Entered by:   Delilah Shan CMA (AAMA)   Authorized by:   Crawford Givens MD   Signed by:   Delilah Shan CMA (AAMA) on 11/28/2009   Method used:   Telephoned to ...       [facility]  [address] [facility]       [address]       [place], [place]  [address]       Ph: [phone] or [phone]       Fax: [phone]   RxID:   [id number]   Appended Document: refill request for oxycodone CORRECTION:  Patient normally picks up this prescription.  Please print to sign.  Appended Document: refill request for oxycodone        Complete Medication List: 1)  Oxycodone-acetaminophen 5-325 Mg Tabs (Oxycodone-acetaminophen) .Marland Kitchen.. 1 by mouth every 4-6 hours as needed pain 2)  Soma 350 Mg Tabs (Carisoprodol) .... One tab by mouth three times a day for 1-2 weeks then as needed  Prescriptions: OXYCODONE-ACETAMINOPHEN 5-325 MG TABS (OXYCODONE-ACETAMINOPHEN) 1 by mouth every 4-6 hours as needed pain  #30 x 0   Entered and Authorized by:   Crawford Givens MD   Signed by:   Crawford Givens MD on 11/28/2009   Method used:   Print then Give to Patient   RxID:   [id number]  Patient Advised. Prescription left at front desk.  Lugene Fuquay CMA Isadore Palecek Dull)  November 28, 2009 3:03 PM

## 2010-06-06 NOTE — Progress Notes (Signed)
Summary: oxycodone  Phone Note Refill Request Call back at [phone] Message from:  Patient on March 27, 2010 1:42 PM  Refills Requested: Medication #1:  OXYCODONE-ACETAMINOPHEN 5-325 MG TABS 1 by mouth every 4-6 hours as needed pain  Method Requested: Pick up at Office Initial call taken by: Melody Comas,  March 27, 2010 1:42 PM  Follow-up for Phone Call        signed.  Follow-up by: Crawford Givens MD,  March 28, 2010 1:41 PM  Additional Follow-up for Phone Call Additional follow up Details #1::        Patient Advised. Prescription left at front desk.  Additional Follow-up by: Delilah Shan CMA [name]),  March 28, 2010 2:21 PM    Prescriptions: OXYCODONE-ACETAMINOPHEN 5-325 MG TABS (OXYCODONE-ACETAMINOPHEN) 1 by mouth every 4-6 hours as needed pain  #30 x 0   Entered and Authorized by:   Crawford Givens MD   Signed by:   Crawford Givens MD on 03/28/2010   Method used:   Print then Give to Patient   RxID:   [id number]

## 2010-06-06 NOTE — Letter (Signed)
Summary: [facility] Instructions  [facility]  [address] [place], [place] [address]   Phone: [phone]  Fax: [phone]       Stephen Hanna    [birth year]    MRN: [id number]        Procedure Day /Date:  Monday 10/31/2009     Arrival Time: 8:30 am      Procedure Time: 9:30 am     Location of Procedure:                    _ x_  [facility] (4th Floor)                        PREPARATION FOR COLONOSCOPY WITH MOVIPREP   Starting 5 days prior to your procedure Wednesday 6/22 do not eat nuts, seeds, popcorn, corn, beans, peas,  salads, or any raw vegetables.  Do not take any fiber supplements (e.g. Metamucil, Citrucel, and Benefiber).  THE DAY BEFORE YOUR PROCEDURE         DATE: Sunday  6/26  1.  Drink clear liquids the entire day-NO SOLID FOOD  2.  Do not drink anything colored red or purple.  Avoid juices with pulp.  No orange juice.  3.  Drink at least 64 oz. (8 glasses) of fluid/clear liquids during the day to prevent dehydration and help the prep work efficiently.  CLEAR LIQUIDS INCLUDE: Water Jello Ice Popsicles Tea (sugar ok, no milk/cream) Powdered fruit flavored drinks Coffee (sugar ok, no milk/cream) Gatorade Juice: apple, white grape, white cranberry  Lemonade Clear bullion, consomm, broth Carbonated beverages (any kind) Strained chicken noodle soup Hard Candy                             4.  In the morning, mix first dose of MoviPrep solution:    Empty 1 Pouch A and 1 Pouch B into the disposable container    Add lukewarm drinking water to the top line of the container. Mix to dissolve    Refrigerate (mixed solution should be used within 24 hrs)  5.  Begin drinking the prep at 5:00 p.m. The MoviPrep container is divided by 4 marks.   Every 15 minutes drink the solution down to the next mark (approximately 8 oz) until the full liter is complete.   6.  Follow completed prep with 16 oz of clear liquid of your choice  (Nothing red or purple).  Continue to drink clear liquids until bedtime.  7.  Before going to bed, mix second dose of MoviPrep solution:    Empty 1 Pouch A and 1 Pouch B into the disposable container    Add lukewarm drinking water to the top line of the container. Mix to dissolve    Refrigerate  THE DAY OF YOUR PROCEDURE      DATE: Monday 6/27  Beginning at 4:30 a.m. (5 hours before procedure):         1. Every 15 minutes, drink the solution down to the next mark (approx 8 oz) until the full liter is complete.  2. Follow completed prep with 16 oz. of clear liquid of your choice.    3. You may drink clear liquids until 7:30 am (2 HOURS BEFORE PROCEDURE).   MEDICATION INSTRUCTIONS  Unless otherwise instructed, you should take regular prescription medications with a small sip of water   as early as possible the morning  of your procedure.           OTHER INSTRUCTIONS  You will need a responsible adult at least 57 years of age to accompany you and drive you home.   This person must remain in the waiting room during your procedure.  Wear loose fitting clothing that is easily removed.  Leave jewelry and other valuables at home.  However, you may wish to bring a book to read or  an iPod/MP3 player to listen to music as you wait for your procedure to start.  Remove all body piercing jewelry and leave at home.  Total time from sign-in until discharge is approximately 2-3 hours.  You should go home directly after your procedure and rest.  You can resume normal activities the  day after your procedure.  The day of your procedure you should not:   Drive   Make legal decisions   Operate machinery   Drink alcohol   Return to work  You will receive specific instructions about eating, activities and medications before you leave.    The above instructions have been reviewed and explained to me by   Ezra Sites RN  October 17, 2009 11:10 AM     I fully understand and  can verbalize these instructions _____________________________ Date _________

## 2010-06-06 NOTE — Progress Notes (Signed)
Summary: refill request for oxycodone  Phone Note Refill Request Call back at [phone] Message from:  Patient  Refills Requested: Medication #1:  OXYCODONE-ACETAMINOPHEN 5-325 MG TABS 1 by mouth every 4-6 hours as needed pain Please call pt when ready.  Initial call taken by: Lowella Petties CMA,  August 31, 2009 2:51 PM  Follow-up for Phone Call        Done. Follow-up by: Shaune Leeks MD,  August 31, 2009 4:20 PM  Additional Follow-up for Phone Call Additional follow up Details #1::        Advised pt ok to pick up script. Additional Follow-up by: Lowella Petties CMA,  August 31, 2009 4:22 PM    Prescriptions: OXYCODONE-ACETAMINOPHEN 5-325 MG TABS (OXYCODONE-ACETAMINOPHEN) 1 by mouth every 4-6 hours as needed pain  #30 x 0   Entered and Authorized by:   Shaune Leeks MD   Signed by:   Shaune Leeks MD on 08/31/2009   Method used:   Print then Give to Patient   RxID:   [id number]

## 2010-06-06 NOTE — Progress Notes (Signed)
Summary: refill request for percocet  Phone Note Refill Request Call back at [phone] Message from:  Patient  Refills Requested: Medication #1:  OXYCODONE-ACETAMINOPHEN 5-325 MG TABS 1 by mouth every 4-6 hours as needed pain Pt will pick up after 10/31/09.  Initial call taken by: Lowella Petties CMA,  October 28, 2009 12:43 PM  Follow-up for Phone Call        Patient notified that rx is up front and ready for pickup. Follow-up by: Sydell Axon LPN,  October 31, 2009 9:47 AM    Prescriptions: OXYCODONE-ACETAMINOPHEN 5-325 MG TABS (OXYCODONE-ACETAMINOPHEN) 1 by mouth every 4-6 hours as needed pain  #30 x 0   Entered and Authorized by:   Shaune Leeks MD   Signed by:   Shaune Leeks MD on 10/31/2009   Method used:   Print then Give to Patient   RxID:   [id number]

## 2010-06-06 NOTE — Letter (Signed)
Summary: Colonoscopy Letter  [facility]  [address] [place], [place] [address]   Phone: [phone]  Fax: [phone]      August 16, 2009 MRN: [id number]   DEACON GADBOIS [address] [place], [place]  [address]   Dear Mr. Devall,   According to your medical record, it is time for you to schedule a Colonoscopy. The American Cancer Society recommends this procedure as a method to detect early colon cancer. Patients with a family history of colon cancer, or a personal history of colon polyps or inflammatory bowel disease are at increased risk.  This letter has beeen generated based on the recommendations made at the time of your procedure. If you feel that in your particular situation this may no longer apply, please contact our office.  Please call our office at [phone] to schedule this appointment or to update your records at your earliest convenience.  Thank you for cooperating with [place] to provide you with the very best care possible.   Sincerely,  Judie Petit T. Russella Dar, M.D.  [facility] [phone]

## 2010-06-08 NOTE — Assessment & Plan Note (Signed)
Summary: [name]   Vital Signs:  Patient profile:   55 year old male Height:      74 inches Weight:      212.50 pounds BMI:     27.38 Temp:     98.2 degrees F oral Pulse rate:   72 / minute Pulse rhythm:   regular BP sitting:   110 / 80  (left arm) Cuff size:   large  Vitals Entered By: Benny Lennert CMA [name]) (May 24, 2010 3:25 PM)  History of Present Illness: Chief complaint ? diverticulitis  LLQ pain also complains of some back pain.  A couple of days.  h/o diverticulitis.  Ate a little soup and pumpkin bread.  Some nausea, no v  no bloody diarrhea.   ate 3 peanuts over the weekend.   h/o gallbladder removal in the past, no other abd surgeries.  Allergies: 1)  ! Stephen Hanna  Past History:  Past medical, surgical, family and social histories (including risk factors) reviewed, and no changes noted (except as noted below).  Past Surgical History: Reviewed history from 12/27/2008 and no changes required. VASECTOMY 1990 CARDIOLITE NORMAL EF 53%:(08/20/2002) CT ABD  W/O  NEGATIVE STONES +MULTIPLE  AORTIC L.N.:(07/24/2004) COLONOSCOPY POLYPS B-9 DIVERTICUS --REPEAT IN 2011:(09/01/2004) CT  ABD/PELVIS --DIVERTICULITIS SIG LEFT RENAL STONE:(02/07/2005) CT ABD/PELVIS W AND W/O NO GALLSTONES  MILD DILATION ENTIRE RIGHT URETER LAP CHOLEY (DR BYRNETT) (12/27/2008)  Family History: Reviewed history from 10/17/2009 and no changes required. Father: DECEASED AT 13 WITH MI  Mother: dec 4/11 Kyphosis   BIOLOGIC MOTHER STOMACH ANEURSYM LOST WEIGHT //DOESN'T SEE DOCTOR BROTHER 1/2 A  60s Brother 1/2 A 61  SISTER 1/2 A 62 SISTER  A 60 CV: + MI, FATHER DECEASED  HBP: NEGATIVE DM: NEGATIVE GOUT/ARTHRITIS: PROSTATE CANCER: BREAST/OVARIAN/UTERINE CANCER: NEGATIVE COLON CANCER: DEPRESSION: + GM ETOH/DRUG ABUSE: NEGATIVE OTHER: NEGATIVE STROKE  Social History: Reviewed history from 10/08/2007 and no changes required. Marital Status: MarriedLIVES WITH  WIFE  Children: 2 SONS Occupation: RETIRED FROM [employer]// PAINTS HOUSES NOW  Review of Systems      See HPI General:  Complains of fatigue and loss of appetite; denies fever. GI:  Complains of abdominal pain, loss of appetite, and nausea; denies bloody stools, change in bowel habits, constipation, dark tarry stools, excessive appetite, and vomiting.  Physical Exam  General:  GEN: Well-developed,well-nourished,in no acute distress; alert,appropriate and cooperative throughout examination HEENT: Normocephalic and atraumatic without obvious abnormalities. No apparent alopecia or balding. Ears, externally no deformities PULM: Breathing comfortably in no respiratory distress EXT: No clubbing, cyanosis, or edema PSYCH: Normally interactive. Cooperative during the interview. Pleasant. Friendly and conversant. Not anxious or depressed appearing. Normal, full affect.  Abdomen:  LLQ abd pain no rebound no HSM + BSno distention and no guarding.   Msk:  LLQ abd pain no rebound no HSM + BS   Impression & Recommendations:  Problem # 1:  DIVERTICULITIS, ACUTE (ICD-562.11) Assessment New Cipro Flagyl  diet modification reviewed.  confirmed ok to refill percocet and soma with Dr. Hetty Ely  Complete Medication List: 1)  Oxycodone-acetaminophen 5-325 Mg Tabs (Oxycodone-acetaminophen) .Marland Kitchen.. 1 by mouth every 4-6 hours as needed pain 2)  Soma 350 Mg Tabs (Carisoprodol) .... One tab by mouth three times a day for 1-2 weeks then as needed 3)  Ciprofloxacin Hcl 750 Mg Tabs (Ciprofloxacin hcl) .Marland Kitchen.. 1 by mouth two times a day 4)  Metronidazole 500 Mg Tabs (Metronidazole) .Marland Kitchen.. 1 by mouth three times a day Prescriptions: OXYCODONE-ACETAMINOPHEN 5-325 MG TABS (  OXYCODONE-ACETAMINOPHEN) 1 by mouth every 4-6 hours as needed pain  #30 x 0   Entered and Authorized by:   [name]   Signed by:   [name] on 05/24/2010   Method used:   Print then Give to Patient   RxID:    [id number] SOMA 350 MG TABS (CARISOPRODOL) one tab by mouth three times a day for 1-2 weeks then as needed  #50 x 0   Entered and Authorized by:   [name]   Signed by:   [name] on 05/24/2010   Method used:   Print then Give to Patient   RxID:   [id number] METRONIDAZOLE 500 MG  TABS (METRONIDAZOLE) 1 by mouth three times a day  #30 x 0   Entered and Authorized by:   [name]   Signed by:   [name] on 05/24/2010   Method used:   [facility]        [facility]  [address]       [address]       [place], [place]  [address]       Ph: [phone] or [phone]       Fax: [phone]   RxID:   [id number] CIPROFLOXACIN HCL 750 MG TABS (CIPROFLOXACIN HCL) 1 by mouth two times a day  #20 x 0   Entered and Authorized by:   [name]   Signed by:   [name] on 05/24/2010   Method used:   [facility]        [facility]  [address]       [address]       [place], [place]  [address]       Ph: [phone] or [phone]       Fax: [phone]   RxID:   [id number]    Orders Added: 1)  Est. Patient Level IV [[address]]    Current Allergies (reviewed today): ! * Stephen Hanna

## 2010-06-08 NOTE — Progress Notes (Signed)
Summary: refill request for oxycodone  Phone Note Refill Request Call back at [phone] Message from:  Patient  Refills Requested: Medication #1:  OXYCODONE-ACETAMINOPHEN 5-325 MG TABS 1 by mouth every 4-6 hours as needed pain Please call pt when ready.  Initial call taken by: Lowella Petties CMA, AAMA,  April 24, 2010 1:06 PM  Follow-up for Phone Call        please set patient up for appointment in 06/2010.  thanks. rx signed.  Follow-up by: Crawford Givens MD,  April 24, 2010 1:40 PM  Additional Follow-up for Phone Call Additional follow up Details #1::        Patient Advised. Prescription left at front desk.  Offered to schedule an appointment in February.  He declined saying he would need to call back after checking his schedule. Additional Follow-up by: Delilah Shan CMA [name]),  April 24, 2010 2:48 PM    Prescriptions: OXYCODONE-ACETAMINOPHEN 5-325 MG TABS (OXYCODONE-ACETAMINOPHEN) 1 by mouth every 4-6 hours as needed pain  #30 x 0   Entered and Authorized by:   Crawford Givens MD   Signed by:   Crawford Givens MD on 04/24/2010   Method used:   Print then Give to Patient   RxID:   [id number]

## 2010-06-22 ENCOUNTER — Encounter: Payer: Self-pay | Admitting: Family Medicine

## 2010-06-22 ENCOUNTER — Ambulatory Visit (INDEPENDENT_AMBULATORY_CARE_PROVIDER_SITE_OTHER): Payer: 59 | Admitting: Family Medicine

## 2010-06-22 DIAGNOSIS — M545 Low back pain, unspecified: Secondary | ICD-10-CM

## 2010-06-22 DIAGNOSIS — K589 Irritable bowel syndrome without diarrhea: Secondary | ICD-10-CM | POA: Insufficient documentation

## 2010-06-28 NOTE — Assessment & Plan Note (Signed)
Summary: FOLLOW UP/[facility]   Vital Signs:  Patient profile:   55 year old male Weight:      209.75 pounds Temp:     97.4 degrees F oral Pulse rate:   64 / minute Pulse rhythm:   regular BP sitting:   112 / 78  (left arm) Cuff size:   large  Vitals Entered By: Sydell Axon LPN (June 22, 2010 8:03 AM) CC: Follow-up on diverticulitis   History of Present Illness: Pt here since diverticulitis. He had 5 BMs yesterday. He ate at [employer] last night and then quickly had to go to [place].  He has had GI irritability since having his gallbladder taken out and has a very robust postprandial reflex. He has learned to try to avoid fried foods and other sources of fat.  He was again treated for divericulitis 2 weeks ago with Cipro and Flagyl. He tolerated that reasonably well.  He also needs a refill on his pain meds...he has a golf tournament this weekend.  Problems Prior to Update: 1)  Diverticulitis, Acute  (ICD-562.11) 2)  Renal Calculus, Recurrent  (ICD-592.0) 3)  Benign Prostatic Hypertrophy, Mild, Hx of  (ICD-V13.8) 4)  Other and Unspecified Hyperlipidemia  (ICD-272.4) 5)  Special Screening Malig Neoplasms Other Sites  (ICD-V76.49) 6)  Hyperglycemia  (ICD-790.29) 7)  Diverticulosis of Colon  (ICD-562.10) 8)  Colonic Polyps  (ICD-211.3) 9)  Special Screening Malignant Neoplasm of Prostate  (ICD-V76.44) 10)  Health Maintenance Exam  (ICD-V70.0) 11)  Back Pain, Lumbar  (ICD-724.2)  Medications Prior to Update: 1)  Oxycodone-Acetaminophen 5-325 Mg Tabs (Oxycodone-Acetaminophen) .Marland Kitchen.. 1 By Mouth Every 4-6 Hours As Needed Pain 2)  Soma 350 Mg Tabs (Carisoprodol) .... One Tab By Mouth Three Times A Day For 1-2 Weeks Then As Needed 3)  Ciprofloxacin Hcl 750 Mg Tabs (Ciprofloxacin Hcl) .Marland Kitchen.. 1 By Mouth Two Times A Day 4)  Metronidazole 500 Mg  Tabs (Metronidazole) .Marland Kitchen.. 1 By Mouth Three Times A Day  Current Medications (verified): 1)  Oxycodone-Acetaminophen 5-325 Mg Tabs  (Oxycodone-Acetaminophen) .Marland Kitchen.. 1 By Mouth Every 4-6 Hours As Needed Pain 2)  Soma 350 Mg Tabs (Carisoprodol) .... One Tab By Mouth Three Times A Day For 1-2 Weeks Then As Needed  Allergies: 1)  ! [name]  Past History:  Past Surgical History: VASECTOMY 1990 CARDIOLITE NORMAL EF 53%:(08/20/2002) CT ABD  W/O  NEGATIVE STONES +MULTIPLE  AORTIC L.N.:(07/24/2004) COLONOSCOPY POLYPS B-9 DIVERTICUS --REPEAT IN 2011:(09/01/2004) CT  ABD/PELVIS --DIVERTICULITIS SIG LEFT RENAL STONE:(02/07/2005) CT ABD/PELVIS W AND W/O NO GALLSTONES  MILD DILATION ENTIRE RIGHT URETER LAP CHOLEY (DR BYRNETT) (12/27/2008) COLONOSCOPY DIVERTICS ONE SM POLYP (DR STARK) (10/2010)      5 YRS  Physical Exam  General:  Well-developed,well-nourished,in no acute distress; alert,appropriate and cooperative throughout examination Head:  Normocephalic and atraumatic without obvious abnormalities. No apparent alopecia or balding. Sinuses NT. Eyes:  Conjunctiva clear bilaterally.  Ears:  External ear exam shows no significant lesions or deformities.  Otoscopic examination reveals clear canals, tympanic membranes are intact bilaterally without bulging, retraction, inflammation or discharge. Hearing is grossly normal bilaterally. Nose:  External nasal examination shows no deformity or inflammation. Nasal mucosa are pink and moist without lesions or exudates. Mouth:  Oral mucosa and oropharynx without lesions or exudates.  Teeth in good repair. Neck:  No deformities, masses, or tenderness noted. Lungs:  Normal respiratory effort, chest expands symmetrically. Lungs are clear to auscultation, no crackles or wheezes. Heart:  Normal rate and regular rhythm. S1 and  S2 normal without gallop, murmur, click, rub or other extra sounds. Abdomen:  Bowel sounds positive,abdomen soft and non-tender without masses, organomegaly or hernias noted. LLQ benign.   Impression & Recommendations:  Problem # 1:  IRRITABLE BOWEL SYNDROME  (ICD-564.1) Assessment New I believe his constellation of sxs constitutes IBS and we discussed trying to stabilize bowel function. Suggest starting with fiber agent like Citrucel and then possibly adding Probiotic in a few weeks.  Problem # 2:  BACK PAIN, LUMBAR (ICD-724.2) Assessment: Unchanged Script for Oxycodone written. His updated medication list for this problem includes:    Oxycodone-acetaminophen 5-325 Mg Tabs (Oxycodone-acetaminophen) .Marland Kitchen... 1 by mouth every 4-6 hours as needed pain    Soma 350 Mg Tabs (Carisoprodol) ..... One tab by mouth three times a day for 1-2 weeks then as needed  Complete Medication List: 1)  Oxycodone-acetaminophen 5-325 Mg Tabs (Oxycodone-acetaminophen) .Marland Kitchen.. 1 by mouth every 4-6 hours as needed pain 2)  Soma 350 Mg Tabs (Carisoprodol) .... One tab by mouth three times a day for 1-2 weeks then as needed  Patient Instructions: 1)  Try Citrucel 1 tsp in 8 oz of water and consume immed. Prescriptions: OXYCODONE-ACETAMINOPHEN 5-325 MG TABS (OXYCODONE-ACETAMINOPHEN) 1 by mouth every 4-6 hours as needed pain  #30 x 0   Entered and Authorized by:   Shaune Leeks MD   Signed by:   Shaune Leeks MD on 06/22/2010   Method used:   Print then Give to Patient   RxID:   [id number]    Orders Added: 1)  Est. Patient Level III [56213]    Current Allergies (reviewed today): ! [name]

## 2010-07-27 ENCOUNTER — Telehealth: Payer: Self-pay | Admitting: *Deleted

## 2010-07-27 MED ORDER — OXYCODONE-ACETAMINOPHEN 5-325 MG PO TABS: 1.0000 | ORAL_TABLET | ORAL | Status: AC | PRN

## 2010-07-28 NOTE — Telephone Encounter (Signed)
Advised pt script is ready to pick up.

## 2010-08-31 ENCOUNTER — Other Ambulatory Visit: Payer: Self-pay | Admitting: Family Medicine

## 2010-08-31 MED ORDER — OXYCODONE-ACETAMINOPHEN 5-325 MG PO TABS: ORAL_TABLET | ORAL | Status: DC

## 2010-08-31 NOTE — Telephone Encounter (Signed)
Please  give to patient

## 2010-08-31 NOTE — Telephone Encounter (Signed)
Patient advised.  Rx. Left at front desk for pick up. 

## 2010-09-29 ENCOUNTER — Other Ambulatory Visit: Payer: Self-pay | Admitting: *Deleted

## 2010-09-29 MED ORDER — OXYCODONE-ACETAMINOPHEN 5-325 MG PO TABS: ORAL_TABLET | ORAL | Status: DC

## 2010-09-29 NOTE — Telephone Encounter (Signed)
Please  give to patient

## 2010-09-29 NOTE — Telephone Encounter (Signed)
Please call pt when ready, he is ok to get this on Tuesday.

## 2010-09-30 NOTE — Telephone Encounter (Signed)
Patient notified that rx is up front and ready for pickup on Tuesday.

## 2010-10-06 DIAGNOSIS — K5792 Diverticulitis of intestine, part unspecified, without perforation or abscess without bleeding: Secondary | ICD-10-CM

## 2010-10-06 HISTORY — DX: Diverticulitis of intestine, part unspecified, without perforation or abscess without bleeding: K57.92

## 2010-10-12 ENCOUNTER — Other Ambulatory Visit: Payer: Self-pay | Admitting: Family Medicine

## 2010-10-12 DIAGNOSIS — E785 Hyperlipidemia, unspecified: Secondary | ICD-10-CM

## 2010-10-12 DIAGNOSIS — R7309 Other abnormal glucose: Secondary | ICD-10-CM

## 2010-10-12 DIAGNOSIS — K5730 Diverticulosis of large intestine without perforation or abscess without bleeding: Secondary | ICD-10-CM

## 2010-10-12 DIAGNOSIS — Z87898 Personal history of other specified conditions: Secondary | ICD-10-CM

## 2010-10-23 ENCOUNTER — Other Ambulatory Visit (INDEPENDENT_AMBULATORY_CARE_PROVIDER_SITE_OTHER): Payer: 59

## 2010-10-23 DIAGNOSIS — E785 Hyperlipidemia, unspecified: Secondary | ICD-10-CM

## 2010-10-23 DIAGNOSIS — R7309 Other abnormal glucose: Secondary | ICD-10-CM

## 2010-10-23 DIAGNOSIS — Z87898 Personal history of other specified conditions: Secondary | ICD-10-CM

## 2010-10-23 DIAGNOSIS — K5730 Diverticulosis of large intestine without perforation or abscess without bleeding: Secondary | ICD-10-CM

## 2010-10-23 LAB — HEPATIC FUNCTION PANEL
ALT: 15 U/L (ref 0–53)
AST: 13 U/L (ref 0–37)
Bilirubin, Direct: 0.1 mg/dL (ref 0.0–0.3)
Total Bilirubin: 0.6 mg/dL (ref 0.3–1.2)

## 2010-10-23 LAB — RENAL FUNCTION PANEL
Albumin: 4.4 g/dL (ref 3.5–5.2)
CO2: 26 mEq/L (ref 19–32)
Calcium: 8.9 mg/dL (ref 8.4–10.5)
Potassium: 4.9 mEq/L (ref 3.5–5.1)
Sodium: 141 mEq/L (ref 135–145)

## 2010-10-23 LAB — CBC WITH DIFFERENTIAL/PLATELET
Basophils Relative: 0.8 % (ref 0.0–3.0)
Eosinophils Absolute: 0.1 10*3/uL (ref 0.0–0.7)
Eosinophils Relative: 1.5 % (ref 0.0–5.0)
Lymphocytes Relative: 35.6 % (ref 12.0–46.0)
Monocytes Relative: 7.7 % (ref 3.0–12.0)
Neutrophils Relative %: 54.4 % (ref 43.0–77.0)
RBC: 4.91 Mil/uL (ref 4.22–5.81)
WBC: 5.9 10*3/uL (ref 4.5–10.5)

## 2010-10-23 LAB — MICROALBUMIN / CREATININE URINE RATIO
Creatinine,U: 84.4 mg/dL
Microalb Creat Ratio: 1.1 mg/g (ref 0.0–30.0)
Microalb, Ur: 0.9 mg/dL (ref 0.0–1.9)

## 2010-10-23 LAB — LIPID PANEL: Triglycerides: 72 mg/dL (ref 0.0–149.0)

## 2010-10-25 ENCOUNTER — Encounter: Payer: Self-pay | Admitting: Family Medicine

## 2010-11-02 ENCOUNTER — Other Ambulatory Visit: Payer: Self-pay | Admitting: *Deleted

## 2010-11-02 NOTE — Telephone Encounter (Signed)
Will print off at the office and then have staff notify pt.

## 2010-11-02 NOTE — Telephone Encounter (Signed)
Please call pt when ready.

## 2010-11-03 MED ORDER — OXYCODONE-ACETAMINOPHEN 5-325 MG PO TABS: ORAL_TABLET | ORAL | Status: DC

## 2010-11-03 NOTE — Telephone Encounter (Signed)
Left message on cell phone voicemail, Rx is ready for pick up will be left at front desk.

## 2010-11-03 NOTE — Telephone Encounter (Signed)
Please give to pt.  

## 2010-11-11 ENCOUNTER — Encounter: Payer: Self-pay | Admitting: Family Medicine

## 2010-11-22 ENCOUNTER — Ambulatory Visit (INDEPENDENT_AMBULATORY_CARE_PROVIDER_SITE_OTHER): Payer: 59 | Admitting: Family Medicine

## 2010-11-22 ENCOUNTER — Encounter: Payer: Self-pay | Admitting: Family Medicine

## 2010-11-22 DIAGNOSIS — E785 Hyperlipidemia, unspecified: Secondary | ICD-10-CM

## 2010-11-22 DIAGNOSIS — N200 Calculus of kidney: Secondary | ICD-10-CM

## 2010-11-22 DIAGNOSIS — K589 Irritable bowel syndrome without diarrhea: Secondary | ICD-10-CM

## 2010-11-22 DIAGNOSIS — D126 Benign neoplasm of colon, unspecified: Secondary | ICD-10-CM

## 2010-11-22 DIAGNOSIS — R7309 Other abnormal glucose: Secondary | ICD-10-CM

## 2010-11-22 DIAGNOSIS — K5730 Diverticulosis of large intestine without perforation or abscess without bleeding: Secondary | ICD-10-CM

## 2010-11-22 NOTE — Assessment & Plan Note (Signed)
Continues but hasn't accelerated. Encouraged to avoid sweets and carbs.

## 2010-11-22 NOTE — Patient Instructions (Signed)
RTC one year, sooner as needed. 

## 2010-11-22 NOTE — Progress Notes (Signed)
  Subjective:    Patient ID: Stephen Hanna, male    DOB: 06-07-1955, 55 y.o.   MRN: [id number]  HPI Pt here for Comp Exam. He has had IBS/Divertic sxs in the last year. His abd has calmed down but he still has sxs on various days. He has no complaints today except more BMs at times, some timea as many as five a day.    Review of Systems  Constitutional: Negative for fever, chills, diaphoresis, appetite change, fatigue and unexpected weight change.  HENT: Negative for hearing loss, ear pain, tinnitus and ear discharge.   Eyes: Negative for pain, discharge and visual disturbance.  Respiratory: Negative for cough, shortness of breath and wheezing.   Cardiovascular: Negative for chest pain and palpitations.       No SOB w/ exertion  Gastrointestinal: Negative for nausea, vomiting, abdominal pain, diarrhea, constipation and blood in stool.       No heartburn or swallowing problems.  Genitourinary: Negative for dysuria, frequency and difficulty urinating.       No nocturia He has some frequency at times.  Musculoskeletal: Negative for myalgias, back pain and arthralgias.  Skin: Negative for rash.       No itching or dryness.  Neurological: Negative for tremors and numbness.       No tingling or balance problems.  Hematological: Negative for adenopathy. Does not bruise/bleed easily.  Psychiatric/Behavioral: Negative for dysphoric mood and agitation.       Objective:   Physical Exam  Constitutional: He is oriented to person, place, and time. He appears well-developed and well-nourished. No distress.  HENT:  Head: Normocephalic and atraumatic.  Right Ear: External ear normal.  Left Ear: External ear normal.  Nose: Nose normal.  Mouth/Throat: Oropharynx is clear and moist.  Eyes: Conjunctivae and EOM are normal. Pupils are equal, round, and reactive to light. Right eye exhibits no discharge. Left eye exhibits no discharge. No scleral icterus.  Neck: Normal range of motion. Neck supple.  No thyromegaly present.  Cardiovascular: Normal rate, regular rhythm, normal heart sounds and intact distal pulses.   No murmur heard. Pulmonary/Chest: Effort normal and breath sounds normal. No respiratory distress. He has no wheezes.  Abdominal: Soft. Bowel sounds are normal. He exhibits no distension and no mass. There is no tenderness. There is no rebound and no guarding.  Genitourinary: Rectum normal, prostate normal and penis normal. Guaiac negative stool.       Prostate 40-60gms. Smooth and firm.  Musculoskeletal: Normal range of motion. He exhibits no edema.  Lymphadenopathy:    He has no cervical adenopathy.  Neurological: He is alert and oriented to person, place, and time. Coordination normal.  Skin: Skin is warm and dry. No rash noted. He is not diaphoretic.  Psychiatric: He has a normal mood and affect. His behavior is normal. Judgment and thought content normal.          Assessment & Plan:  HMPE

## 2010-11-22 NOTE — Assessment & Plan Note (Signed)
Knows to come in for LLQ discomfort of more than a few days.

## 2010-11-22 NOTE — Assessment & Plan Note (Signed)
Avoid dehydration. Drink plenty of fluids, esp in the heat.

## 2010-11-22 NOTE — Assessment & Plan Note (Signed)
IS learning to control with intake. Is getting more fiber.

## 2010-11-22 NOTE — Assessment & Plan Note (Signed)
LDL high. Discussed meds. HE would like to hold off. Avoid fatty foods.

## 2010-11-22 NOTE — Assessment & Plan Note (Signed)
Colonoscopy UTD. 

## 2010-12-01 ENCOUNTER — Other Ambulatory Visit: Payer: Self-pay | Admitting: *Deleted

## 2010-12-01 MED ORDER — OXYCODONE-ACETAMINOPHEN 5-325 MG PO TABS: ORAL_TABLET | ORAL | Status: DC

## 2010-12-01 NOTE — Telephone Encounter (Signed)
Rx left up front for pick up. Patient notified.

## 2011-01-01 ENCOUNTER — Telehealth: Payer: Self-pay | Admitting: *Deleted

## 2011-01-01 MED ORDER — OXYCODONE-ACETAMINOPHEN 5-325 MG PO TABS: ORAL_TABLET | ORAL | Status: DC

## 2011-01-01 NOTE — Telephone Encounter (Signed)
Patient advised.  Rx placed at front desk for pick up.

## 2011-01-01 NOTE — Telephone Encounter (Signed)
Needs a written rx for his Oyxcodone. Call when it is ready for pickup.

## 2011-01-01 NOTE — Telephone Encounter (Signed)
Please give to pt.  

## 2011-01-04 ENCOUNTER — Ambulatory Visit (INDEPENDENT_AMBULATORY_CARE_PROVIDER_SITE_OTHER): Payer: 59 | Admitting: Family Medicine

## 2011-01-04 ENCOUNTER — Encounter: Payer: Self-pay | Admitting: Family Medicine

## 2011-01-04 VITALS — BP 108/68 | HR 64 | Temp 97.5°F | Ht 74.0 in | Wt 202.0 lb

## 2011-01-04 DIAGNOSIS — R5381 Other malaise: Secondary | ICD-10-CM

## 2011-01-04 DIAGNOSIS — R5383 Other fatigue: Secondary | ICD-10-CM | POA: Insufficient documentation

## 2011-01-04 LAB — CBC WITH DIFFERENTIAL/PLATELET
Basophils Relative: 1.4 % (ref 0.0–3.0)
Eosinophils Absolute: 0 10*3/uL (ref 0.0–0.7)
Eosinophils Relative: 1 % (ref 0.0–5.0)
Hemoglobin: 14.7 g/dL (ref 13.0–17.0)
Lymphocytes Relative: 30.9 % (ref 12.0–46.0)
Monocytes Relative: 7.8 % (ref 3.0–12.0)
Neutro Abs: 2.5 10*3/uL (ref 1.4–7.7)
Neutrophils Relative %: 58.9 % (ref 43.0–77.0)
RBC: 4.98 Mil/uL (ref 4.22–5.81)
WBC: 4.3 10*3/uL — ABNORMAL LOW (ref 4.5–10.5)

## 2011-01-04 NOTE — Assessment & Plan Note (Signed)
Pt looks nontoxic and noncongested. Will do labwork to assess no anemia, unknown inflammatory process, tick bites and metabolic and thyroid function.

## 2011-01-04 NOTE — Progress Notes (Signed)
  Subjective:    Patient ID: Stephen Hanna, male    DOB: 1956-01-28, 55 y.o.   MRN: [id number]  HPIPt here as acute appt for headache last two days occas that comes and goes with sore spot in the right back part of his head, chills last night and no energy for the last few days. He was helping his son with bats in the attic of his sons house. He was not bitten but did find a dead bat he removed with pliers. He had put screen over the intakes but the bat had nested between the loovers of the vents. He has had hot flashes come and go.  He has no headache right now, feels ok otherwise except for no energy. He plays a lot of golf and went to the course two days ago but left and went home due to lack of energy. He has some muscle achiness that is transient.     Review of SystemsNo known bites other than one mosquito bite the other day, no known tick bites. 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 He has done no travelling     Objective:   Physical Exam  Constitutional: He is oriented to person, place, and time. He appears well-developed and well-nourished. No distress.       Looks nontoxic.  HENT:  Head: Normocephalic and atraumatic.  Right Ear: External ear normal.  Left Ear: External ear normal.  Nose: Nose normal.  Mouth/Throat: Oropharynx is clear and moist.  Eyes: Conjunctivae and EOM are normal. Pupils are equal, round, and reactive to light. Right eye exhibits no discharge. Left eye exhibits no discharge.  Neck: Normal range of motion. Neck supple.  Cardiovascular: Normal rate and regular rhythm.   Pulmonary/Chest: Effort normal and breath sounds normal. He has no wheezes.  Lymphadenopathy:    He has no cervical adenopathy.  Neurological: He is alert and oriented to person, place, and time.  Skin: Skin is warm and dry. No rash noted. He is not diaphoretic. No erythema.       Has sore spot on head with no visible lesion.  Psychiatric: He has a normal mood and affect. His behavior is  normal. Judgment and thought content normal.          Assessment & Plan:

## 2011-01-04 NOTE — Patient Instructions (Signed)
Will call with lab results. Stay well hydrated.

## 2011-01-05 LAB — B. BURGDORFI ANTIBODIES: B burgdorferi Ab IgG+IgM: 0.14 {ISR}

## 2011-01-05 LAB — ROCKY MTN SPOTTED FVR AB, IGG-BLOOD: RMSF IgG: 0.2 IV

## 2011-01-30 ENCOUNTER — Other Ambulatory Visit: Payer: Self-pay | Admitting: *Deleted

## 2011-01-30 NOTE — Telephone Encounter (Signed)
Patient called requesting a refill on his Oxycodone. Please call when ready.

## 2011-01-31 MED ORDER — OXYCODONE-ACETAMINOPHEN 5-325 MG PO TABS: ORAL_TABLET | ORAL | Status: DC

## 2011-01-31 NOTE — Telephone Encounter (Signed)
Patient notified that rx is up front and ready for pickup. 

## 2011-03-05 ENCOUNTER — Other Ambulatory Visit: Payer: Self-pay | Admitting: *Deleted

## 2011-03-05 MED ORDER — OXYCODONE-ACETAMINOPHEN 5-325 MG PO TABS: ORAL_TABLET | ORAL | Status: DC

## 2011-03-05 NOTE — Telephone Encounter (Signed)
Wife advised that Rx is ready for pick up.  Left at front desk.

## 2011-03-05 NOTE — Telephone Encounter (Signed)
Please give to pt.  Thanks.   

## 2011-03-05 NOTE — Telephone Encounter (Signed)
Pt requested this on Friday, please call him when ready.

## 2011-04-10 ENCOUNTER — Other Ambulatory Visit: Payer: Self-pay | Admitting: *Deleted

## 2011-04-10 NOTE — Telephone Encounter (Signed)
Patient called for a written prescription for his pain medication. Please call when ready for pickup.

## 2011-04-11 ENCOUNTER — Ambulatory Visit (INDEPENDENT_AMBULATORY_CARE_PROVIDER_SITE_OTHER): Payer: 59

## 2011-04-11 DIAGNOSIS — Z23 Encounter for immunization: Secondary | ICD-10-CM

## 2011-04-11 MED ORDER — OXYCODONE-ACETAMINOPHEN 5-325 MG PO TABS: ORAL_TABLET | ORAL | Status: DC

## 2011-04-11 NOTE — Telephone Encounter (Signed)
Patient notified that rx is up front and ready for pickup. 

## 2011-05-07 ENCOUNTER — Encounter: Payer: Self-pay | Admitting: Internal Medicine

## 2011-05-07 ENCOUNTER — Ambulatory Visit (INDEPENDENT_AMBULATORY_CARE_PROVIDER_SITE_OTHER): Payer: 59 | Admitting: Internal Medicine

## 2011-05-07 DIAGNOSIS — M545 Low back pain, unspecified: Secondary | ICD-10-CM

## 2011-05-07 DIAGNOSIS — J209 Acute bronchitis, unspecified: Secondary | ICD-10-CM

## 2011-05-07 MED ORDER — OXYCODONE-ACETAMINOPHEN 5-325 MG PO TABS: 1.0000 | ORAL_TABLET | Freq: Four times a day (QID) | ORAL | Status: DC | PRN

## 2011-05-07 MED ORDER — AMOXICILLIN 500 MG PO TABS: 1000.0000 mg | ORAL_TABLET | Freq: Two times a day (BID) | ORAL | Status: AC

## 2011-05-07 NOTE — Assessment & Plan Note (Signed)
Discussed stopping the few cigarettes he smokes  Sick for over a week with sig productive cough Will treat with amoxicillin analgesics

## 2011-05-07 NOTE — Progress Notes (Signed)
  Subjective:    Patient ID: Stephen Hanna, male    DOB: [birth year], 55 y.o.   MRN: [id number]  HPI Has had chest cold for over a week Chest congestion Cough with green mucus Lots of sneezing  No fever or maybe just low grade at night Slight SOB---hears some wheezing at times No sore throat Not much nasal congestion No ear pain  nyquil has helped him sleep  Only smokes 1 pack of cigarettes per week  Current Outpatient Prescriptions on File Prior to Visit  Medication Sig Dispense Refill  . carisoprodol (SOMA) 350 MG tablet Take 350 mg by mouth 3 (three) times daily as needed.        . psyllium (METAMUCIL) 58.6 % powder Take 1 packet by mouth daily.          Allergies  Allergen Reactions  . Hydrocodone-Acetaminophen     REACTION: edgey    Past Medical History  Diagnosis Date  . Diverticulitis 10/2010    divertics one small polyp (Dr. Russella Dar)  . Personal history of kidney stones 02/07/2005    CT ABD/pelvis --Diverticulitis sig left renal stone // CT ABD w/o negative stones + multiple aortic L.N. 07/24/2004//    Past Surgical History  Procedure Date  . Vasectomy 1990  . Laparoscopic cholecystectomy w/ cholangiography 12/27/2008    Ct abd/pelvis w/o no gallstones mild dilation entire right ureter lap choley (Dr. Lemar Livings)    Family History  Problem Relation Age of Onset  . Heart disease Father 7    MI  . Depression Maternal Grandmother   . Alcohol abuse Neg Hx   . Stroke Neg Hx     History   Social History  . Marital Status: Married    Spouse Name: N/A    Number of Children: N/A  . Years of Education: N/A   Occupational History  . Not on file.   Social History Main Topics  . Smoking status: Current Everyday Smoker -- 0.5 packs/day for 30 years    Types: Cigarettes  . Smokeless tobacco: Current User    Types: Chew  . Alcohol Use: 0.5 oz/week    1 drink(s) per week     2-3 beers a month  . Drug Use: No  . Sexually Active: Yes   Other Topics  Concern  . Not on file   Social History Narrative  . No narrative on file   Review of Systems No rash No vomiting or diarrhea Able to eat Vague aches and pains     Objective:   Physical Exam  Constitutional: He appears well-developed and well-nourished. No distress.  HENT:  Mouth/Throat: Oropharynx is clear and moist. No oropharyngeal exudate.       No sinus tenderness Mild nasal congestion  Neck: Normal range of motion. Neck supple.  Pulmonary/Chest: Effort normal and breath sounds normal. No respiratory distress. He has no wheezes. He has no rales.  Musculoskeletal: He exhibits no edema.  Lymphadenopathy:    He has no cervical adenopathy.          Assessment & Plan:

## 2011-05-07 NOTE — Assessment & Plan Note (Signed)
Will refill his percocet for Dr Hetty Ely

## 2011-06-06 ENCOUNTER — Other Ambulatory Visit: Payer: Self-pay | Admitting: Family Medicine

## 2011-06-06 MED ORDER — OXYCODONE-ACETAMINOPHEN 5-325 MG PO TABS: 1.0000 | ORAL_TABLET | Freq: Four times a day (QID) | ORAL | Status: DC | PRN

## 2011-06-06 NOTE — Telephone Encounter (Signed)
Pt needs refill on his oxycodon.

## 2011-06-06 NOTE — Telephone Encounter (Signed)
Please to patient, needs OV this spring to [facility] new MD.

## 2011-06-06 NOTE — Telephone Encounter (Signed)
Patient notified that Rx was ready and placed up front for pick up. Appointment scheduled for transfer in March as directed.

## 2011-07-05 ENCOUNTER — Other Ambulatory Visit: Payer: Self-pay | Admitting: *Deleted

## 2011-07-06 MED ORDER — OXYCODONE-ACETAMINOPHEN 5-325 MG PO TABS: 1.0000 | ORAL_TABLET | Freq: Four times a day (QID) | ORAL | Status: DC | PRN

## 2011-07-06 NOTE — Telephone Encounter (Signed)
Patient advised.  Rx left at front desk for pick up. 

## 2011-07-06 NOTE — Telephone Encounter (Signed)
Please give to pt.  

## 2011-07-12 ENCOUNTER — Ambulatory Visit: Payer: 59 | Admitting: Family Medicine

## 2011-07-30 ENCOUNTER — Other Ambulatory Visit: Payer: Self-pay

## 2011-07-30 MED ORDER — OXYCODONE-ACETAMINOPHEN 5-325 MG PO TABS: 1.0000 | ORAL_TABLET | Freq: Four times a day (QID) | ORAL | Status: DC | PRN

## 2011-07-30 NOTE — Telephone Encounter (Signed)
Pt request written rx for Percocet. Pt last saw Dr Alphonsus Sias 05/07/11. Pt request call back at [phone] when rx is ready for pick up.

## 2011-07-30 NOTE — Telephone Encounter (Signed)
Printed

## 2011-07-31 NOTE — Telephone Encounter (Signed)
Patient advised.  Rx left at front desk for pick up. 

## 2011-08-29 ENCOUNTER — Telehealth: Payer: Self-pay | Admitting: Family Medicine

## 2011-08-29 NOTE — Telephone Encounter (Signed)
Pt is wanting Oxycodon refilled and please call [phone].

## 2011-08-30 MED ORDER — OXYCODONE-ACETAMINOPHEN 5-325 MG PO TABS: 1.0000 | ORAL_TABLET | Freq: Four times a day (QID) | ORAL | Status: DC | PRN

## 2011-08-30 NOTE — Telephone Encounter (Signed)
It is not clear from Dr Lorenza Chick notes why he is on the percocet. Only possible indication would be kidney stones which would not require regular refills  See what he is taking them for. The regularity of refills is very concerning and there is heightened DEA surveillance of this type of med use so we have to be very careful

## 2011-08-30 NOTE — Telephone Encounter (Signed)
Patient advised.  States he will call back to schedule appt at a later time.

## 2011-08-30 NOTE — Telephone Encounter (Signed)
Discussed with patient when I gave him Rx

## 2011-08-30 NOTE — Telephone Encounter (Signed)
I will refill this but I think he should get an appt ASAP to discuss this. Diverticulitis is an episodic, often severe, infection and not an ongoing painful process.  I will send to Dr Para March for his review

## 2011-08-30 NOTE — Telephone Encounter (Signed)
My understanding of this (based on the chart) was that it was for back pain and had been filled per Dr. Hetty Ely w/o misuse.  Please get patient an OV with me in next 2 weeks to discuss.  Thanks.  30 min visit.

## 2011-08-30 NOTE — Telephone Encounter (Signed)
Dr. Para March pt asking for refill, please advise, pt has never seen Dr. Para March, but has been getting refills every month, had CPE sch in July. Please advise

## 2011-08-30 NOTE — Telephone Encounter (Signed)
Patient is in the lobby, pt states he doesn't have a problem getting this every month from Dr. Para March and would like a refill, pt takes this mostly for diverticulitis, shoulder and knee pain, but Dr. Hetty Ely gave it to him for diverticulitis. Please advise

## 2011-09-11 ENCOUNTER — Ambulatory Visit (INDEPENDENT_AMBULATORY_CARE_PROVIDER_SITE_OTHER)
Admission: RE | Admit: 2011-09-11 | Discharge: 2011-09-11 | Disposition: A | Discharge status: Home / Self Care | Payer: 59 | Source: Ambulatory Visit | Attending: Family Medicine | Admitting: Family Medicine

## 2011-09-11 ENCOUNTER — Ambulatory Visit (INDEPENDENT_AMBULATORY_CARE_PROVIDER_SITE_OTHER): Payer: 59 | Admitting: Family Medicine

## 2011-09-11 ENCOUNTER — Encounter: Payer: Self-pay | Admitting: Family Medicine

## 2011-09-11 VITALS — BP 122/78 | HR 59 | Temp 97.5°F | Wt 208.0 lb

## 2011-09-11 DIAGNOSIS — M549 Dorsalgia, unspecified: Secondary | ICD-10-CM

## 2011-09-11 DIAGNOSIS — M545 Low back pain, unspecified: Secondary | ICD-10-CM

## 2011-09-11 MED ORDER — IBUPROFEN 200 MG PO TABS: 400.0000 mg | ORAL_TABLET | Freq: Three times a day (TID) | ORAL | Status: AC | PRN

## 2011-09-11 MED ORDER — TRAMADOL HCL 50 MG PO TABS: 50.0000 mg | ORAL_TABLET | Freq: Three times a day (TID) | ORAL | Status: DC | PRN

## 2011-09-11 NOTE — Progress Notes (Signed)
Lower back injury in late 1980s. Since then with episodic flares.  Worse in last 6 months.  Sharp, sudden pain that will hit in lower back. Can radiate into the R hip.  Not radiation down the leg.  He doesn't have true weakness, but the pain can be severe enough to limit his strength.  He is s/p PT and continues with exercises.  Prev imaged, not recently.  He is a Surveyor, minerals, isn't sedated from the meds.  Intolerant of vicodin.  Rare use of soma for muscle strain, upper back.  He's has not been on nsaids or tramadol prev.    H/o diverticulosis/diverticulitis.  He can have some pain with dietary indiscretion.  He knows trigger foods, ie foods with seeds.  It can happen about once a month with some discomfort.  Advised not to use pain meds for this, would need f/u if pain persisted.    PMH and SH reviewed  ROS: See HPI, otherwise noncontributory.  Meds, vitals, and allergies reviewed.   nad ncat Mmm rrr ctab abd soft not ttp, normal BS R SI and R lower facet pain on testing SLR neg, distally nv intact No midline back pain on palpation.

## 2011-09-11 NOTE — Patient Instructions (Signed)
Take tramadol as needed for pain, sedation caution.  Take ibuprofen with food as needed for pain. If pain not relieved, then use the percocet.  We'll contact you with your xray report.

## 2011-09-13 ENCOUNTER — Encounter: Payer: Self-pay | Admitting: Family Medicine

## 2011-09-13 NOTE — Assessment & Plan Note (Signed)
Chronic with episodic flares. Continue stretching.  Will tramadol and low dose of ibuprofen.  If not relieved, then restart percocet (he'll often break it in half).  No ADE from meds, no sedation.  No diversion, no illicit use.  He'll notify me if not improved with tramadol.  >25 min spent with face to face with patient, >50% counseling and/or coordinating care .

## 2011-11-01 ENCOUNTER — Other Ambulatory Visit: Payer: Self-pay

## 2011-11-01 MED ORDER — TRAMADOL HCL 50 MG PO TABS: 50.0000 mg | ORAL_TABLET | Freq: Three times a day (TID) | ORAL | Status: AC | PRN

## 2011-11-01 NOTE — Telephone Encounter (Signed)
Pt request refill on Tramadol [facility]. Call back when refilled.

## 2011-11-01 NOTE — Progress Notes (Signed)
Opened in error, refill encounter opened already.

## 2011-11-01 NOTE — Telephone Encounter (Signed)
Sent!

## 2011-11-13 NOTE — Telephone Encounter (Signed)
Can this encounter be closed?

## 2011-11-26 ENCOUNTER — Other Ambulatory Visit: Payer: Self-pay | Admitting: Family Medicine

## 2011-11-26 DIAGNOSIS — Z125 Encounter for screening for malignant neoplasm of prostate: Secondary | ICD-10-CM

## 2011-11-26 DIAGNOSIS — R739 Hyperglycemia, unspecified: Secondary | ICD-10-CM

## 2011-11-29 ENCOUNTER — Other Ambulatory Visit (INDEPENDENT_AMBULATORY_CARE_PROVIDER_SITE_OTHER): Payer: 59

## 2011-11-29 DIAGNOSIS — R5383 Other fatigue: Secondary | ICD-10-CM

## 2011-11-29 DIAGNOSIS — R5381 Other malaise: Secondary | ICD-10-CM

## 2011-11-29 DIAGNOSIS — Z125 Encounter for screening for malignant neoplasm of prostate: Secondary | ICD-10-CM

## 2011-11-29 DIAGNOSIS — R739 Hyperglycemia, unspecified: Secondary | ICD-10-CM

## 2011-11-29 DIAGNOSIS — R7309 Other abnormal glucose: Secondary | ICD-10-CM

## 2011-11-29 LAB — COMPREHENSIVE METABOLIC PANEL
ALT: 16 U/L (ref 0–53)
Albumin: 4 g/dL (ref 3.5–5.2)
BUN: 17 mg/dL (ref 6–23)
CO2: 27 mEq/L (ref 19–32)
Calcium: 9.4 mg/dL (ref 8.4–10.5)
Chloride: 106 mEq/L (ref 96–112)
Creatinine, Ser: 1 mg/dL (ref 0.4–1.5)
GFR: 77.9 mL/min (ref >60.00)
Potassium: 5.2 mEq/L — ABNORMAL HIGH (ref 3.5–5.1)

## 2011-11-29 LAB — BASIC METABOLIC PANEL
Calcium: 9.4 mg/dL (ref 8.4–10.5)
Creatinine, Ser: 1 mg/dL (ref 0.4–1.5)

## 2011-11-29 LAB — LIPID PANEL
Cholesterol: 191 mg/dL (ref 0–200)
Total CHOL/HDL Ratio: 5
Triglycerides: 112 mg/dL (ref 0.0–149.0)

## 2011-11-30 ENCOUNTER — Ambulatory Visit (INDEPENDENT_AMBULATORY_CARE_PROVIDER_SITE_OTHER): Payer: 59 | Admitting: Family Medicine

## 2011-11-30 ENCOUNTER — Encounter: Payer: Self-pay | Admitting: Family Medicine

## 2011-11-30 VITALS — BP 122/76 | HR 54 | Temp 97.7°F | Wt 205.0 lb

## 2011-11-30 DIAGNOSIS — N200 Calculus of kidney: Secondary | ICD-10-CM

## 2011-11-30 DIAGNOSIS — M545 Low back pain, unspecified: Secondary | ICD-10-CM

## 2011-11-30 DIAGNOSIS — R7309 Other abnormal glucose: Secondary | ICD-10-CM

## 2011-11-30 DIAGNOSIS — E785 Hyperlipidemia, unspecified: Secondary | ICD-10-CM

## 2011-11-30 DIAGNOSIS — Z0000 Encounter for general adult medical examination without abnormal findings: Secondary | ICD-10-CM

## 2011-11-30 DIAGNOSIS — Z87898 Personal history of other specified conditions: Secondary | ICD-10-CM

## 2011-11-30 MED ORDER — MELOXICAM 7.5 MG PO TABS: 7.5000 mg | ORAL_TABLET | Freq: Every day | ORAL | Status: DC

## 2011-11-30 NOTE — Assessment & Plan Note (Signed)
Improved, continue as is with diet and exercise.

## 2011-11-30 NOTE — Assessment & Plan Note (Signed)
Mild and he'll continue to work on diet.

## 2011-11-30 NOTE — Patient Instructions (Addendum)
Try taking mobic in the morning with food and taper back on the tramadol dose.  See if that helps with the pain.  Take care.  Glad to see you.

## 2011-11-30 NOTE — Assessment & Plan Note (Signed)
Try to taper back tramadol and add on low dose meloxicam with GI caution.  He'll call back with update.

## 2011-11-30 NOTE — Progress Notes (Signed)
CPE- See plan.  Routine anticipatory guidance given to patient.  See health maintenance. Tetanus 2006 Flu shot done yearly Colonoscopy 2011, done q 5 years.   PSA wnl. We can consider recheck next year. Discussed guidelines.   Advance directive.  Has a living will.  Discussed.   Quit dipping, working on stopping smoking.    Back pain.  Intolerant of vicodin.  Prev was on oxycodone, transitioned to tramadol.  Drowsy with tramadol, worse in the afternoon.  It does help with the pain but not as much as oxycodone.  Taking 1 in AM and 1/2 in afternoon.  Sleeping okay, waking rested.  Not snoring.  Rarely taking ibuprofen.    Elevated Cholesterol: No meds Labs d/w pt, LDL improved.  Diet compliance: avoiding fatty food Exercise: at work, painting houses  L sided muscular chest pain.  Can be sore at rest.  reproduceable pain per patient, worse with certain movement.    PMH and SH reviewed  Meds, vitals, and allergies reviewed.   ROS: See HPI.  Otherwise negative.    GEN: nad, alert and oriented HEENT: mucous membranes moist NECK: supple w/o LA CV: rrr. PULM: ctab, no inc wob ABD: soft, +bs EXT: no edema SKIN: no acute rash L chest wall with reproducable pain on the lateral portion of the pec major w/o rash or bruising.

## 2011-11-30 NOTE — Assessment & Plan Note (Signed)
PSA wnl.  D/w pt about guidelines, consider recheck in the future.  DRE deferred.

## 2011-11-30 NOTE — Assessment & Plan Note (Signed)
Recently passed another stone but doing well now.

## 2012-05-13 ENCOUNTER — Ambulatory Visit (INDEPENDENT_AMBULATORY_CARE_PROVIDER_SITE_OTHER): Payer: 59 | Admitting: Family Medicine

## 2012-05-13 ENCOUNTER — Encounter: Payer: Self-pay | Admitting: Family Medicine

## 2012-05-13 VITALS — BP 130/80 | HR 61 | Temp 98.0°F | Wt 217.8 lb

## 2012-05-13 DIAGNOSIS — J069 Acute upper respiratory infection, unspecified: Secondary | ICD-10-CM

## 2012-05-13 MED ORDER — FLUTICASONE PROPIONATE 50 MCG/ACT NA SUSP: NASAL | Status: DC

## 2012-05-13 MED ORDER — AZITHROMYCIN 250 MG PO TABS: ORAL_TABLET | ORAL | Status: DC

## 2012-05-13 NOTE — Progress Notes (Signed)
URI.  Going on for about 3 days.  L ear pain, crackling and popping.  Stuffy, rhinorrhea.  No fevers.  Mild cough.  <1ppd.  Quit chewing tobacco in 2013.  Some green/yellow nasal discharge.  Not SOB.    Hadn't had a flu shot yet this year. He'll get one when he's well.    Meds, vitals, and allergies reviewed.   ROS: See HPI.  Otherwise, noncontributory.  GEN: nad, alert and oriented HEENT: mucous membranes moist, tm w/o erythema but L ETD on testing noted, nasal exam w/o erythema, clear discharge noted,  OP with cobblestoning Sinuses not ttp NECK: supple w/o LA CV: rrr.   PULM: ctab, no inc wob EXT: no edema SKIN: no acute rash

## 2012-05-13 NOTE — Patient Instructions (Signed)
Use the flonase for a few days.  If not improving, then take the zithromax.  Take care.

## 2012-05-14 DIAGNOSIS — J069 Acute upper respiratory infection, unspecified: Secondary | ICD-10-CM | POA: Insufficient documentation

## 2012-05-14 NOTE — Assessment & Plan Note (Signed)
With L ETD but sinuses not ttp.  Nontoxic.  Would use flonase for a few days and hold abx, start if sx continue ~1 week.  He agrees.  F/u prn.

## 2012-11-17 ENCOUNTER — Other Ambulatory Visit: Payer: Self-pay | Admitting: *Deleted

## 2012-11-17 MED ORDER — MELOXICAM 7.5 MG PO TABS: 7.5000 mg | ORAL_TABLET | Freq: Every day | ORAL | Status: DC

## 2012-11-17 NOTE — Telephone Encounter (Signed)
Sent, due for CPE.

## 2012-11-17 NOTE — Telephone Encounter (Signed)
Received faxed refill request from pharmacy. Please verify directions. Request from pharmacy reads one tablet daily. Last office visit 05/13/12. Is it okay to refill medication?

## 2012-11-26 NOTE — Telephone Encounter (Signed)
Appointments scheduled

## 2013-01-17 ENCOUNTER — Other Ambulatory Visit: Payer: Self-pay | Admitting: Family Medicine

## 2013-01-17 DIAGNOSIS — R7309 Other abnormal glucose: Secondary | ICD-10-CM

## 2013-01-21 ENCOUNTER — Other Ambulatory Visit (INDEPENDENT_AMBULATORY_CARE_PROVIDER_SITE_OTHER): Payer: 59

## 2013-01-21 DIAGNOSIS — R5381 Other malaise: Secondary | ICD-10-CM

## 2013-01-21 DIAGNOSIS — E785 Hyperlipidemia, unspecified: Secondary | ICD-10-CM

## 2013-01-21 DIAGNOSIS — R5383 Other fatigue: Secondary | ICD-10-CM

## 2013-01-21 DIAGNOSIS — R7309 Other abnormal glucose: Secondary | ICD-10-CM

## 2013-01-21 DIAGNOSIS — Z0000 Encounter for general adult medical examination without abnormal findings: Secondary | ICD-10-CM

## 2013-01-21 LAB — LIPID PANEL
HDL: 36.8 mg/dL — ABNORMAL LOW (ref >39.00)
LDL Cholesterol: 103 mg/dL — ABNORMAL HIGH (ref 0–99)
Total CHOL/HDL Ratio: 4
Triglycerides: 102 mg/dL (ref 0.0–149.0)

## 2013-01-21 LAB — BASIC METABOLIC PANEL
CO2: 27 mEq/L (ref 19–32)
Chloride: 109 mEq/L (ref 96–112)
Sodium: 140 mEq/L (ref 135–145)

## 2013-01-26 ENCOUNTER — Ambulatory Visit (INDEPENDENT_AMBULATORY_CARE_PROVIDER_SITE_OTHER): Payer: 59 | Admitting: Family Medicine

## 2013-01-26 ENCOUNTER — Encounter: Payer: Self-pay | Admitting: Family Medicine

## 2013-01-26 VITALS — BP 122/70 | HR 60 | Temp 97.8°F | Ht 75.0 in | Wt 208.8 lb

## 2013-01-26 DIAGNOSIS — Z23 Encounter for immunization: Secondary | ICD-10-CM

## 2013-01-26 DIAGNOSIS — M545 Low back pain, unspecified: Secondary | ICD-10-CM

## 2013-01-26 DIAGNOSIS — Z0000 Encounter for general adult medical examination without abnormal findings: Secondary | ICD-10-CM

## 2013-01-26 DIAGNOSIS — R234 Changes in skin texture: Secondary | ICD-10-CM

## 2013-01-26 MED ORDER — MELOXICAM 7.5 MG PO TABS: 7.5000 mg | ORAL_TABLET | Freq: Every day | ORAL | Status: DC

## 2013-01-26 NOTE — Assessment & Plan Note (Signed)
Routine anticipatory guidance given to patient.  See health maintenance. Tetanus 2006 Flu shot prev done.  colonoscopy 2011 Diet and exercise.  Both encouraged.  Physical job.  Trying to get a healthy diet.   Living will.  Wife designated if incapacitated.   Prostate cancer screening and PSA options (with potential risks and benefits of testing vs not testing) were discussed along with recent recs/guidelines.  He declined testing PSA at this point.

## 2013-01-26 NOTE — Patient Instructions (Addendum)
Check with your insurance to see if they will cover the shingles shot. Take care. Glad to see you.  Let me know if you have concerns.

## 2013-01-26 NOTE — Assessment & Plan Note (Signed)
Continue as is.  D/w pt about GI and renal cautions.  Doing well with med.

## 2013-01-26 NOTE — Assessment & Plan Note (Signed)
Declined topical steroid rx, consider in the future.  Likely a chronic issue from kneeling.

## 2013-01-26 NOTE — Progress Notes (Signed)
CPE- See plan.  Routine anticipatory guidance given to patient.  See health maintenance. Tetanus 2006 Flu shot prev done.  colonoscopy 2011 Diet and exercise.  Both encouraged.  Physical job.  Trying to get a healthy diet.   Living will.  Wife designated if incapacitated.   Prostate cancer screening and PSA options (with potential risks and benefits of testing vs not testing) were discussed along with recent recs/guidelines.  He declined testing PSA at this point.  Back pain controlled with current meds.  No ADE.  D/w pt about renal cautions.  Doing well with med and functional.    Skin changes on knee, from kneeling. Itches more in the summer. Can't tolerate pads.  Discussed.  Wants to avoid meds for now.   PMH and SH reviewed  Meds, vitals, and allergies reviewed.   ROS: See HPI.  Otherwise negative.    GEN: nad, alert and oriented HEENT: mucous membranes moist NECK: supple w/o LA CV: rrr. PULM: ctab, no inc wob ABD: soft, +bs EXT: no edema SKIN: no acute rash but chronic thickening on the R>L knee.

## 2013-01-27 ENCOUNTER — Telehealth: Payer: Self-pay

## 2013-01-27 NOTE — Telephone Encounter (Signed)
Pt saw Dx Hyperglycemia on lab report and wants to know what that means; advised pt Dx is abbreviation for diagnosis and hyperglycemia means elevated blood sugar. Pt said he knew he had elevated BS but did not understand the Dx. Pt voiced understanding.

## 2013-03-09 ENCOUNTER — Encounter: Payer: Self-pay | Admitting: Family Medicine

## 2013-03-09 ENCOUNTER — Ambulatory Visit (INDEPENDENT_AMBULATORY_CARE_PROVIDER_SITE_OTHER)
Admission: RE | Admit: 2013-03-09 | Discharge: 2013-03-09 | Disposition: A | Discharge status: Home / Self Care | Payer: 59 | Source: Ambulatory Visit | Attending: Family Medicine | Admitting: Family Medicine

## 2013-03-09 ENCOUNTER — Ambulatory Visit (INDEPENDENT_AMBULATORY_CARE_PROVIDER_SITE_OTHER): Payer: 59 | Admitting: Family Medicine

## 2013-03-09 VITALS — BP 132/80 | HR 81 | Temp 98.2°F | Wt 206.5 lb

## 2013-03-09 DIAGNOSIS — M25569 Pain in unspecified knee: Secondary | ICD-10-CM

## 2013-03-09 DIAGNOSIS — M25561 Pain in right knee: Secondary | ICD-10-CM | POA: Insufficient documentation

## 2013-03-09 MED ORDER — OXYCODONE HCL 5 MG PO TABS: 2.5000 mg | ORAL_TABLET | Freq: Two times a day (BID) | ORAL | Status: DC | PRN

## 2013-03-09 MED ORDER — NICOTINE POLACRILEX 2 MG MT GUM: 2.0000 mg | CHEWING_GUM | OROMUCOSAL | Status: DC | PRN

## 2013-03-09 NOTE — Progress Notes (Signed)
Knee pain continues, right knee pain.  No popping, clicking, or locking but pain with flexion. Still has full extension.  Aches sitting, more pain walking.  Not red; puffy medially.  Medial pain.  No trauma.  Mobic doesn't help enough.  Tramadol didn't help as much prev.  Was never injected prev.  Use sports cream and a brace, alternating.  No help with either.   Meds, vitals, and allergies reviewed.   ROS: See HPI.  Otherwise, noncontributory.  nad Chronic skin changes from kneeling noted B Crepitus with ROM of the R knee, pain with 50% of full flexion.  Normal extension.  Pain with compression of the medial joint line.  Pain on testing lateral meniscus.  ACL MCL LCL feel solid.  No locking.

## 2013-03-09 NOTE — Patient Instructions (Signed)
Go to the lab on the way out.  We'll contact you with your xray report. Shirlee Limerick will call about your referral. Use the oxycodone for pain.  It can make you drowsy.  Take care.

## 2013-03-09 NOTE — Assessment & Plan Note (Addendum)
Plain films pending, will review.  Concern for meniscal pathology.  Refer to ortho.  Intolerant/lack of effect with mult meds.  maxed out on nsaids.  Use plain oxycodone for now with sedation caution.  He agrees.  Continue meloxicam.

## 2013-11-08 IMAGING — CR DG KNEE COMPLETE 4+V*R*
5 series · 5 of 5 positions shown · non-contrast
Comparison: None

CLINICAL DATA: Right knee pain for 1.5 weeks, no known trauma

EXAM:
RIGHT KNEE - COMPLETE 4+ VIEW

[view not recorded (1 of 5)]
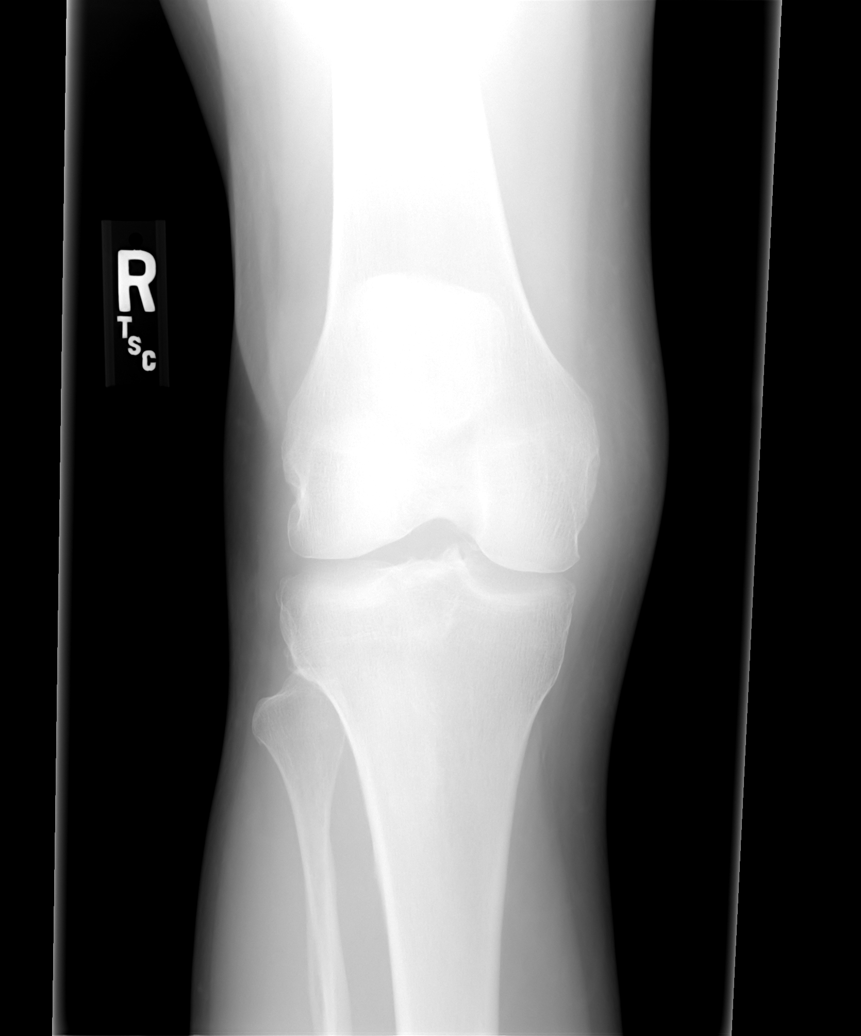

[view not recorded (2 of 5)]
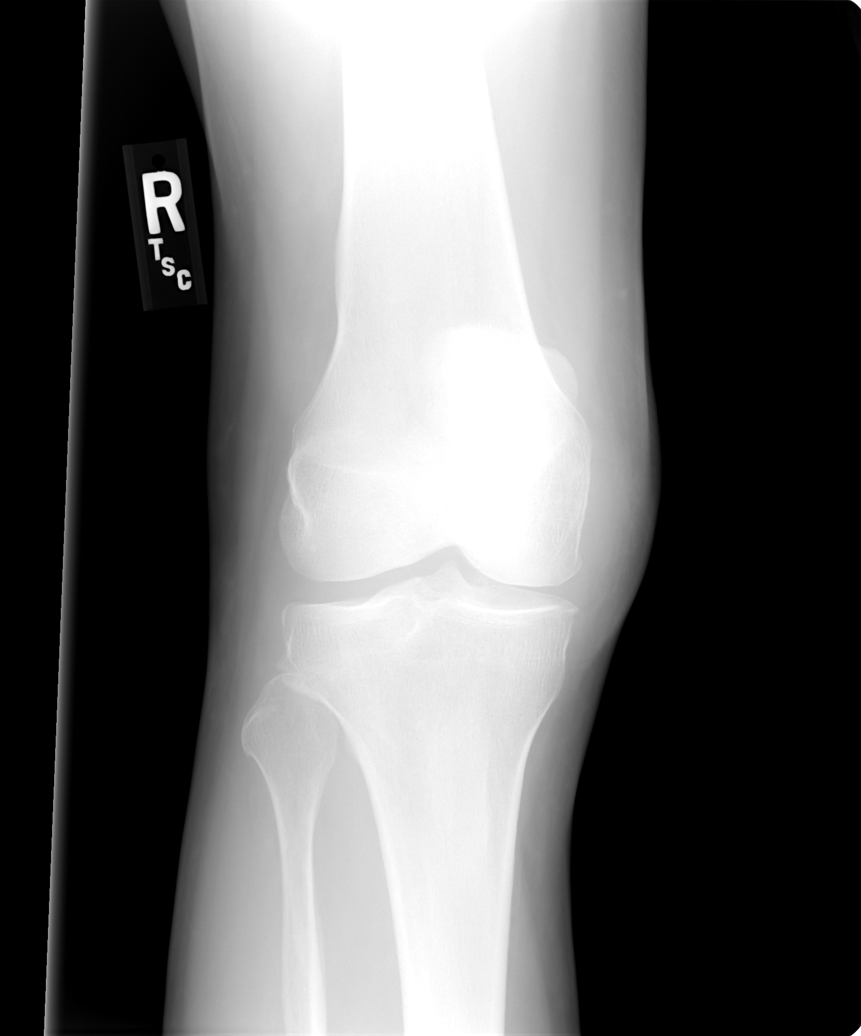

[view not recorded (3 of 5)]
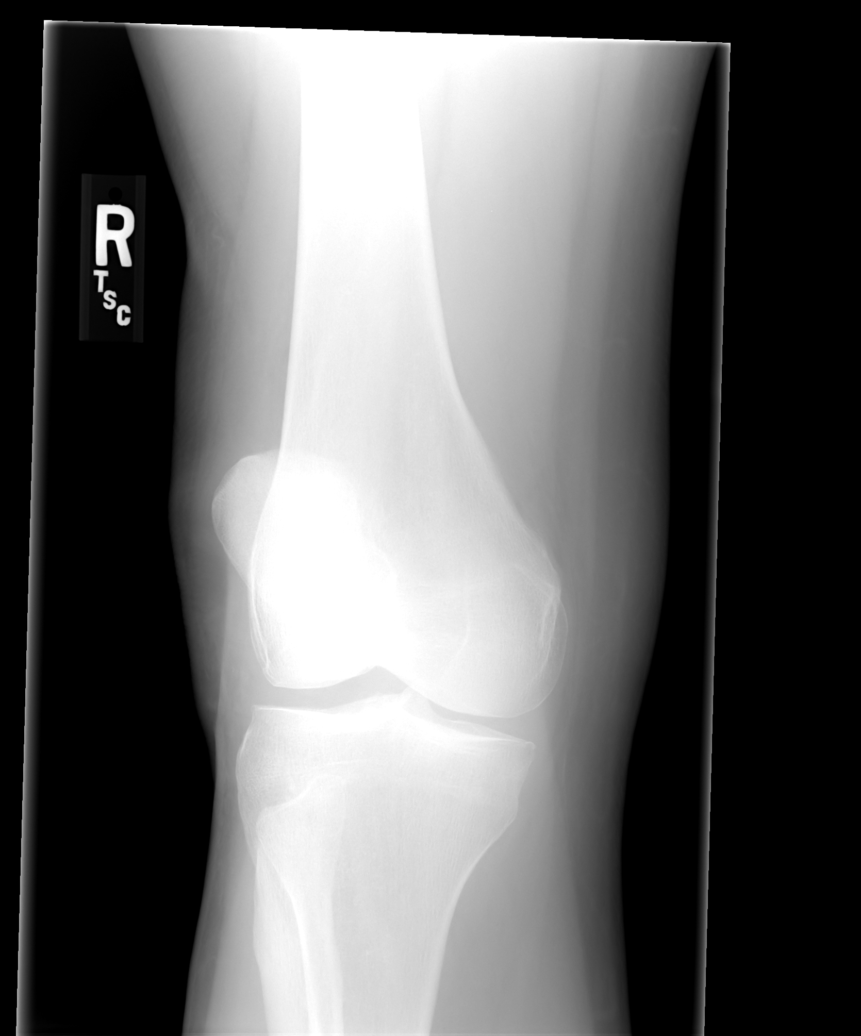

[view not recorded (4 of 5)]
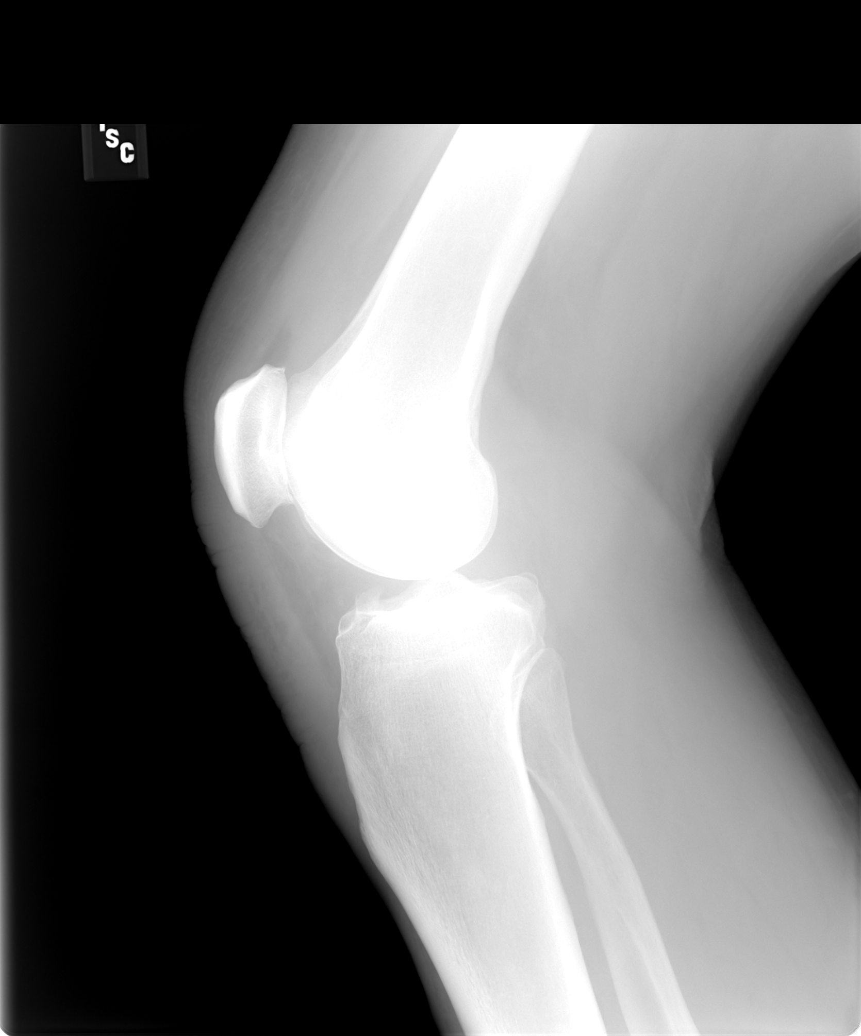

[view not recorded (5 of 5)]
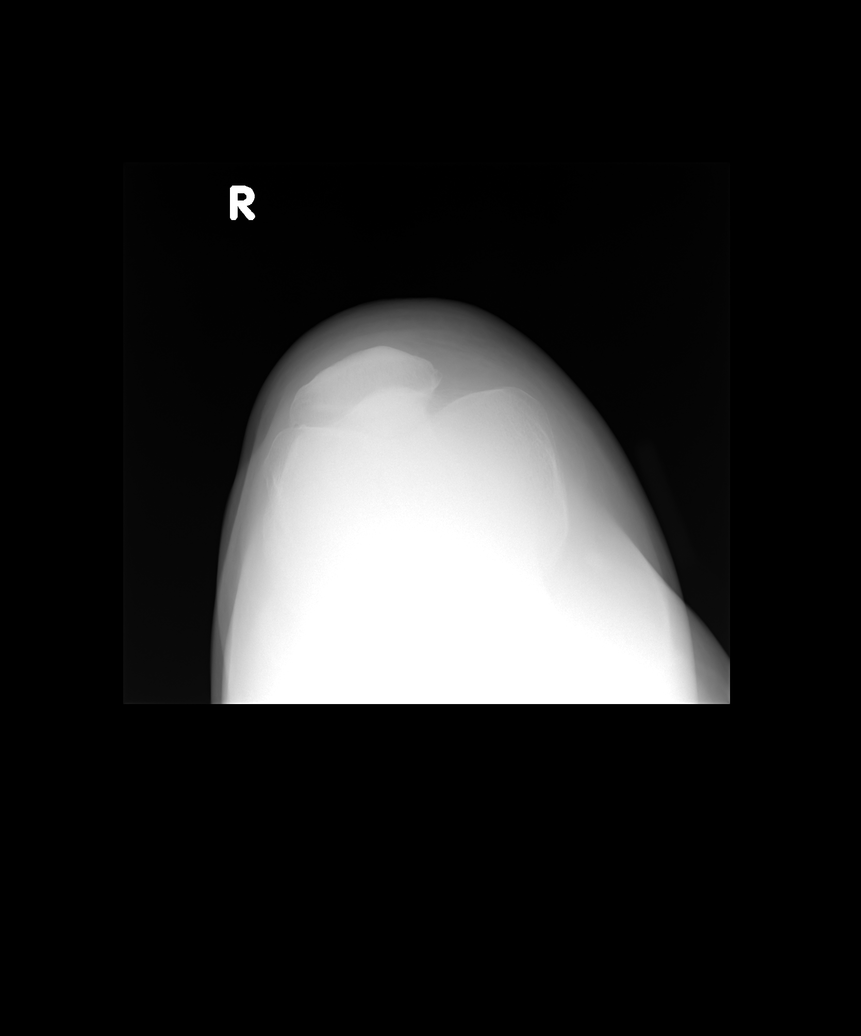

[5 of 5 positions shown; findings below may reference images not displayed]

FINDINGS: Osseous mineralization normal for technique.

Mild patellofemoral joint space narrowing.

Medial lateral compartment joint spaces appear preserved.

No acute fracture, dislocation or bone destruction.

Small knee joint effusion present.
IMPRESSION: Patellofemoral degenerative changes with knee joint effusion.

## 2013-11-19 ENCOUNTER — Other Ambulatory Visit: Payer: Self-pay | Admitting: Family Medicine

## 2013-11-19 NOTE — Telephone Encounter (Signed)
Sent. Thanks.   

## 2013-11-19 NOTE — Telephone Encounter (Signed)
Electronic refill request. Last Filled:   180 tablet 3 RF on   01/26/2013.  Please advise.

## 2014-01-24 ENCOUNTER — Other Ambulatory Visit: Payer: Self-pay | Admitting: Family Medicine

## 2014-01-24 DIAGNOSIS — R7309 Other abnormal glucose: Secondary | ICD-10-CM

## 2014-01-25 ENCOUNTER — Other Ambulatory Visit (INDEPENDENT_AMBULATORY_CARE_PROVIDER_SITE_OTHER): Payer: 59

## 2014-01-25 DIAGNOSIS — R7309 Other abnormal glucose: Secondary | ICD-10-CM

## 2014-01-25 LAB — LIPID PANEL
Cholesterol: 165 mg/dL (ref 0–200)
HDL: 35.6 mg/dL — AB (ref >39.00)
LDL CALC: 111 mg/dL — AB (ref 0–99)
NONHDL: 129.4
Total CHOL/HDL Ratio: 5
Triglycerides: 94 mg/dL (ref 0.0–149.0)
VLDL: 18.8 mg/dL (ref 0.0–40.0)

## 2014-01-25 LAB — BASIC METABOLIC PANEL
BUN: 16 mg/dL (ref 6–23)
CHLORIDE: 108 meq/L (ref 96–112)
CO2: 28 meq/L (ref 19–32)
Calcium: 9 mg/dL (ref 8.4–10.5)
Creatinine, Ser: 0.9 mg/dL (ref 0.4–1.5)
GFR: 93.76 mL/min (ref >60.00)
GLUCOSE: 100 mg/dL — AB (ref 70–99)
POTASSIUM: 4.4 meq/L (ref 3.5–5.1)
Sodium: 140 mEq/L (ref 135–145)

## 2014-01-28 ENCOUNTER — Encounter: Payer: Self-pay | Admitting: Family Medicine

## 2014-01-28 ENCOUNTER — Ambulatory Visit (INDEPENDENT_AMBULATORY_CARE_PROVIDER_SITE_OTHER): Payer: 59 | Admitting: Family Medicine

## 2014-01-28 VITALS — BP 136/82 | HR 69 | Temp 98.2°F | Ht 75.0 in | Wt 208.0 lb

## 2014-01-28 DIAGNOSIS — Z23 Encounter for immunization: Secondary | ICD-10-CM

## 2014-01-28 DIAGNOSIS — Z7189 Other specified counseling: Secondary | ICD-10-CM | POA: Insufficient documentation

## 2014-01-28 DIAGNOSIS — Z2911 Encounter for prophylactic immunotherapy for respiratory syncytial virus (RSV): Secondary | ICD-10-CM

## 2014-01-28 DIAGNOSIS — Z0000 Encounter for general adult medical examination without abnormal findings: Secondary | ICD-10-CM

## 2014-01-28 DIAGNOSIS — M25519 Pain in unspecified shoulder: Secondary | ICD-10-CM

## 2014-01-28 MED ORDER — NICOTINE POLACRILEX 4 MG MT GUM: 4.0000 mg | CHEWING_GUM | OROMUCOSAL | Status: DC | PRN

## 2014-01-28 MED ORDER — MELOXICAM 7.5 MG PO TABS: ORAL_TABLET | ORAL | Status: DC

## 2014-01-28 NOTE — Assessment & Plan Note (Addendum)
Routine anticipatory guidance given to patient. See health maintenance.  Tetanus 2006  Shingles shot d/w pt.  PNA at 63.  Flu shot at clinic today (2015)  Colonoscopy 2011 - h/o hyperplastic polyp.  Prostate cancer screening and PSA options (with potential risks and benefits of testing vs not testing) were discussed along with recent recs/guidelines. He has rare nocturia. H/o BPH noted. PSAs prev noted. He has urinary urgency in the day. He can have frequency with 3-4 voids in an hour, then go for a few hours w/o needing to void. Stream is good. Mult caffeine servings daily, d/w pt. That may be the issue for him. He declined PSA at this point and that is reasonable. We can always recheck it if urinary sx continue.  Living will d/w pt. Wife designated if patient were incapacitated.  Diet and exercise. Physical job, a lot of walking. Diet is fair.  He'll work on smoking cessation.

## 2014-01-28 NOTE — Patient Instructions (Addendum)
Taper off the caffeine and see if your urination improves. If not, then notify me.   Use the shoulder exercises and if not better the get set up with [facility].  Take care.  Glad to see you.

## 2014-01-28 NOTE — Assessment & Plan Note (Signed)
Likely cuff sx.  D/w pt about home exercises to do, d/w pt about anatomy and rationale.  Continue mobic.  If not improved, I want patient to see Dr. Lorelei Pont. He agrees.

## 2014-01-28 NOTE — Progress Notes (Signed)
Pre visit review using our clinic review tool, if applicable. No additional management support is needed unless otherwise documented below in the visit note.  CPE- See plan.  Routine anticipatory guidance given to patient.  See health maintenance. Tetanus 2006 Shingles shot d/w pt.   PNA at 63.  Flu shot at clinic today (2015) Colonoscopy 2011 - h/o hyperplastic polyp.  Prostate cancer screening and PSA options (with potential risks and benefits of testing vs not testing) were discussed along with recent recs/guidelines.  He has rare nocturia.  H/o BPH noted.  PSAs prev noted.  He has urinary urgency in the day.  He can have frequency with 3-4 voids in an hour, then go for a few hours w/o needing to void.  Stream is good.  Mult caffeine servings daily, d/w pt.  That may be the issue for him.  He declined PSA at this point and that is reasonable. We can always recheck it if urinary sx continue.  Living will d/w pt.  Wife designated if patient were incapacitated.   Diet and exercise.  Physical job, a lot of walking.  Diet is fair.    Back pain.  Controlled generally, occ flares. B upper arm lateral pain, worse in the afternoon, not worse with overhead motion. No trauma.  Pain laying on his side at night.  Meloxicam isn't helping the arm/shoulder pain a lot.  Worse over the last year.  Both sides equally affected.    Wants to quit smoking.   D/w pt.  Wants to use the gum.  rx sent.    PMH and SH reviewed  Meds, vitals, and allergies reviewed.   ROS: See HPI.  Otherwise negative.    GEN: nad, alert and oriented HEENT: mucous membranes moist NECK: supple w/o LA CV: rrr. PULM: ctab, no inc wob ABD: soft, +bs EXT: no edema SKIN: no acute rash B shoulder exam noted:  Normal inspection.  No bruising.  R>L pain with ext > int rotation.  + supraspinatus testing with normal AC testing.  R scap assist noted.  No arm drop.

## 2014-05-14 ENCOUNTER — Ambulatory Visit (INDEPENDENT_AMBULATORY_CARE_PROVIDER_SITE_OTHER): Payer: 59 | Admitting: Family Medicine

## 2014-05-14 ENCOUNTER — Encounter: Payer: Self-pay | Admitting: Family Medicine

## 2014-05-14 VITALS — BP 158/86 | HR 74 | Temp 98.5°F | Wt 212.0 lb

## 2014-05-14 DIAGNOSIS — IMO0001 Reserved for inherently not codable concepts without codable children: Secondary | ICD-10-CM

## 2014-05-14 DIAGNOSIS — R030 Elevated blood-pressure reading, without diagnosis of hypertension: Secondary | ICD-10-CM

## 2014-05-14 DIAGNOSIS — I10 Essential (primary) hypertension: Secondary | ICD-10-CM

## 2014-05-14 DIAGNOSIS — J0100 Acute maxillary sinusitis, unspecified: Secondary | ICD-10-CM

## 2014-05-14 LAB — BASIC METABOLIC PANEL
BUN: 14 mg/dL (ref 6–23)
CALCIUM: 9.4 mg/dL (ref 8.4–10.5)
CO2: 30 meq/L (ref 19–32)
Chloride: 107 mEq/L (ref 96–112)
Creatinine, Ser: 1.1 mg/dL (ref 0.4–1.5)
GFR: 72.4 mL/min (ref >60.00)
Glucose, Bld: 92 mg/dL (ref 70–99)
Potassium: 4.4 mEq/L (ref 3.5–5.1)
Sodium: 143 mEq/L (ref 135–145)

## 2014-05-14 MED ORDER — AMOXICILLIN-POT CLAVULANATE 875-125 MG PO TABS: 1.0000 | ORAL_TABLET | Freq: Two times a day (BID) | ORAL | Status: DC

## 2014-05-14 NOTE — Patient Instructions (Signed)
Go to the lab on the way out.  We'll contact you with your lab report. Start the antibiotics and gargle with salt water.  Try to get some rest.  Drink plenty of fluids.  Try to cut out salt (DASH diet).  If you BP stays >140/>90, then let me know.  Take care.

## 2014-05-14 NOTE — Progress Notes (Signed)
Pre visit review using our clinic review tool, if applicable. No additional management support is needed unless otherwise documented below in the visit note.  Sick for about 1 week.  ST initially, then sinus pain.  Not much cough now, more prev.  Recently with chills.  No vomiting. No sputum usually, occ sputum.   R ear pain.  Still with facial pain.  Stuffy.  Some rhinorrhea.  Mult sick contacts.  No fevers known, but felt hot.  Diffuse aches.    Abnormal BP out of clinic, similar to today, before he got sick.  Prev with gradually inc in BP over the years.  No CP, no SOB.  Is on nsaid, but Cr has been stable.  D/w pt that with or without nsaid use, people then to have inc in BP as they age.  No ADE on med.   Meds, vitals, and allergies reviewed.   ROS: See HPI.  Otherwise, noncontributory.  GEN: nad, alert and oriented HEENT: mucous membranes moist, tm w/o erythema, nasal exam w/o erythema, clear discharge noted,  OP with cobblestoning, max sinus ttp B NECK: supple w/o LA CV: rrr.   PULM: ctab, no inc wob EXT: no edema SKIN: no acute rash

## 2014-05-16 DIAGNOSIS — J0100 Acute maxillary sinusitis, unspecified: Secondary | ICD-10-CM | POA: Insufficient documentation

## 2014-05-16 DIAGNOSIS — Z8679 Personal history of other diseases of the circulatory system: Secondary | ICD-10-CM | POA: Insufficient documentation

## 2014-05-16 NOTE — Assessment & Plan Note (Signed)
Augmentin, supportive care, f/u pn.  Nontoxic.  D/w pt.  Agreed.

## 2014-05-16 NOTE — Assessment & Plan Note (Signed)
He'll check BP out of clinic when feeling well.  DASH diet d/w pt.  If persistently up, then he'll notify me.  See AVS. See notes on labs.

## 2014-05-17 ENCOUNTER — Telehealth: Payer: Self-pay | Admitting: Family Medicine

## 2014-05-17 NOTE — Telephone Encounter (Signed)
emmi mailed  °

## 2014-06-03 ENCOUNTER — Telehealth: Payer: Self-pay

## 2014-06-03 NOTE — Telephone Encounter (Signed)
Pt was seen 05/14/2014; pt BP is averaging between 159/94 to latest BP taken last night at pharmacy of 176/94. Pt does not have BP cuff to take BP at home. Today no CP, SOB, H/A or dizziness. Last night pt had some indigestion but after taking Tums indigestion went away. Pt does not take med for elevated BP. Pt request cb. [facility].

## 2014-06-04 MED ORDER — LISINOPRIL 10 MG PO TABS: 10.0000 mg | ORAL_TABLET | Freq: Every day | ORAL | Status: DC

## 2014-06-04 NOTE — Telephone Encounter (Signed)
Noted, if still up then start the med and proceed from that point with the same instructions.  I left the med on the list for now.  Thanks.

## 2014-06-04 NOTE — Telephone Encounter (Signed)
Noted.  Would start lisinopril. 10mg  a day.  Sent. Update [place] on BP in about 1 week.  If lightheaded then cut dose in half. Thanks.

## 2014-06-04 NOTE — Telephone Encounter (Signed)
Patient is resistent to starting medication and wants to try to limit his intake of salty items, nicotine gum, and fried foods for 1 week to see if that makes a difference.

## 2014-07-07 ENCOUNTER — Encounter: Payer: Self-pay | Admitting: Family Medicine

## 2014-07-07 ENCOUNTER — Ambulatory Visit (INDEPENDENT_AMBULATORY_CARE_PROVIDER_SITE_OTHER): Payer: 59 | Admitting: Family Medicine

## 2014-07-07 VITALS — BP 114/80 | HR 59 | Temp 97.6°F | Ht 75.0 in | Wt 210.5 lb

## 2014-07-07 DIAGNOSIS — M7580 Other shoulder lesions, unspecified shoulder: Secondary | ICD-10-CM

## 2014-07-07 DIAGNOSIS — M7550 Bursitis of unspecified shoulder: Secondary | ICD-10-CM

## 2014-07-07 DIAGNOSIS — G2589 Other specified extrapyramidal and movement disorders: Secondary | ICD-10-CM

## 2014-07-07 MED ORDER — METHYLPREDNISOLONE ACETATE 40 MG/ML IJ SUSP
80.0000 mg | Freq: Once | INTRAMUSCULAR | Status: AC
  Administered 2014-07-07: 80 mg via INTRA_ARTICULAR

## 2014-07-07 NOTE — Progress Notes (Signed)
Pre visit review using our clinic review tool, if applicable. No additional management support is needed unless otherwise documented below in the visit note. 

## 2014-07-07 NOTE — Patient Instructions (Signed)
Youtube: Scapular Stabilization Rehab

## 2014-07-07 NOTE — Progress Notes (Signed)
Dr. Frederico Hamman T. Maudean Hoffmann, MD, [facility] [place], [address] Phone: [phone] Fax: [phone]  07/07/2014  Patient: Stephen Hanna, MRN: [id number], DOB: 1955/08/20, 59 y.o.  Primary Physician:  Elsie Stain, MD  Chief Complaint: Shoulder Pain  Subjective:   Stephen Hanna is a 47 y.o. very pleasant male patient who presents with the following:  Both shoulder are hurting - off and on for a couple of months. More so with using his blower. 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  He has been having problems intermittently with his shoulders now for about 2-3 months. He has significant polyp problems with doing up MOTION and with doing some internal range of motion. He also has pain at nighttime in a T-shirt distribution. He has no significant prior history or prior fractures or dislocations of his shoulders.   He has tried some meloxicam without much significant success. He is not having any numbness, tingling, radiculopathy. No significant neck pain.  Meloxicam is different.     Past Medical History, Surgical History, Social History, Family History, Problem List, Medications, and Allergies have been reviewed and updated if relevant.  GEN: No fevers, chills. Nontoxic. Primarily MSK c/o today. MSK: Detailed in the HPI GI: tolerating PO intake without difficulty Neuro: No numbness, parasthesias, or tingling associated. Otherwise the pertinent positives of the ROS are noted above.   Objective:   BP 114/80 mmHg  Pulse 59  Temp(Src) 97.6 F (36.4 C) (Oral)  Ht 6\' 3"  (1.905 m)  Wt 210 lb 8 oz (95.482 kg)  BMI 26.31 kg/m2   GEN: Well-developed,well-nourished,in no acute distress; alert,appropriate and cooperative throughout examination HEENT: Normocephalic and atraumatic without obvious abnormalities. Ears, externally no deformities PULM: Breathing comfortably in no respiratory distress EXT: No clubbing, cyanosis,  or edema PSYCH: Normally interactive. Cooperative during the interview. Pleasant. Friendly and conversant. Not anxious or depressed appearing. Normal, full affect.  Shoulder: B Ecchymosis/edema: neg  AC joint, scapula, clavicle: NT Cervical spine: NT, full ROM Spurling's: neg Abduction: full, 5/5 Flexion: full, 5/5 IR, full, lift-off: 5/5 ER at neutral: full, 5/5 AC crossover: neg Neer: pos Hawkins: pos Drop Test: neg Empty Can: pos Supraspinatus insertion: mild-mod T Bicipital groove: NT Speed's: neg Yergason's: neg Sulcus sign: neg Scapular dyskinesis: MARKEDLY POOR SCAPULAR WINGING WITH PUSH-UPS C5-T1 intact  Neuro: Sensation intact Grip 5/5   Radiology: No results found.  Assessment and Plan:   Scapular dyskinesis - Plan: Ambulatory referral to Physical Therapy, methylPREDNISolone acetate (DEPO-MEDROL) injection 80 mg, methylPREDNISolone acetate (DEPO-MEDROL) injection 80 mg  Rotator cuff tendonitis, unspecified laterality - Plan: Ambulatory referral to Physical Therapy, methylPREDNISolone acetate (DEPO-MEDROL) injection 80 mg, methylPREDNISolone acetate (DEPO-MEDROL) injection 80 mg  Subacromial bursitis, unspecified laterality - Plan: Ambulatory referral to Physical Therapy, methylPREDNISolone acetate (DEPO-MEDROL) injection 80 mg, methylPREDNISolone acetate (DEPO-MEDROL) injection 80 mg  Rotator Cuff Tendinopathy  Shoulder anatomy was reviewed with the patient using and anatomical model.   Retraining shoulder mechanics and function was emphasized to the patient with rehab done at least 5-6 days a week. Markedly poor scapular mechanics. Scapular retraining needed.  The patient could benefit from formal PT to assist with scapular stabilization and RTC strengthening.   SubAC Injection, R Verbal consent was obtained from the patient. Risks (including rare infection), benefits, and alternatives were explained. Patient prepped with Chloraprep and Ethyl Chloride used  for anesthesia. The subacromial space was injected using the posterior approach. The patient tolerated the procedure  well and had decreased pain post injection. No complications. Injection: 8 cc of Lidocaine 1% and Depo-Medrol 80 mg. Needle: 22 gauge   SubAC Injection, L Verbal consent was obtained from the patient. Risks (including rare infection), benefits, and alternatives were explained. Patient prepped with Chloraprep and Ethyl Chloride used for anesthesia. The subacromial space was injected using the posterior approach. The patient tolerated the procedure well and had decreased pain post injection. No complications. Injection: 8 cc of Lidocaine 1% and Depo-Medrol 80 mg. Needle: 22 gauge   Follow-up: 8 weeks if not improved  New Prescriptions   No medications on file   Orders Placed This Encounter  Procedures  . Ambulatory referral to Physical Therapy    Signed,  Frederico Hamman T. Emmalea Treanor, MD   Patient's Medications  New Prescriptions   No medications on file  Previous Medications   LISINOPRIL (PRINIVIL,ZESTRIL) 10 MG TABLET    Take 1 tablet (10 mg total) by mouth daily.   MELOXICAM (MOBIC) 7.5 MG TABLET    TAKE 1-2 TABLETS BY MOUTH DAILY WITH FOOD   NICOTINE POLACRILEX ([facility] NICOTINE POLACRILEX) 4 MG GUM    Take 1 each (4 mg total) by mouth as needed for smoking cessation.   PSYLLIUM (METAMUCIL) 58.6 % POWDER    Take 1 packet by mouth daily.    Modified Medications   No medications on file  Discontinued Medications   AMOXICILLIN-CLAVULANATE (AUGMENTIN) 875-125 MG PER TABLET    Take 1 tablet by mouth 2 (two) times daily.

## 2014-09-10 ENCOUNTER — Encounter: Payer: Self-pay | Admitting: Gastroenterology

## 2014-09-24 ENCOUNTER — Encounter: Payer: Self-pay | Admitting: Gastroenterology

## 2014-10-22 ENCOUNTER — Other Ambulatory Visit: Payer: Self-pay | Admitting: Family Medicine

## 2014-10-22 NOTE — Telephone Encounter (Signed)
Electronic refill request. Last Filled:    100 tablet 2 RF on 01/28/2014  Please advise.

## 2014-10-24 NOTE — Telephone Encounter (Signed)
Sent. Thanks.   

## 2015-01-11 ENCOUNTER — Encounter: Payer: Self-pay | Admitting: Gastroenterology

## 2015-01-24 ENCOUNTER — Other Ambulatory Visit (INDEPENDENT_AMBULATORY_CARE_PROVIDER_SITE_OTHER): Payer: Commercial Managed Care - HMO

## 2015-01-24 ENCOUNTER — Other Ambulatory Visit: Payer: Self-pay | Admitting: Family Medicine

## 2015-01-24 DIAGNOSIS — R739 Hyperglycemia, unspecified: Secondary | ICD-10-CM

## 2015-01-24 LAB — BASIC METABOLIC PANEL
BUN: 15 mg/dL (ref 6–23)
CO2: 27 mEq/L (ref 19–32)
Calcium: 8.9 mg/dL (ref 8.4–10.5)
Chloride: 107 mEq/L (ref 96–112)
Creatinine, Ser: 0.87 mg/dL (ref 0.40–1.50)
GFR: 94.69 mL/min (ref >60.00)
Glucose, Bld: 108 mg/dL — ABNORMAL HIGH (ref 70–99)
Potassium: 4.3 mEq/L (ref 3.5–5.1)
Sodium: 140 mEq/L (ref 135–145)

## 2015-01-24 LAB — LIPID PANEL
Cholesterol: 164 mg/dL (ref 0–200)
HDL: 38 mg/dL — ABNORMAL LOW (ref >39.00)
LDL Cholesterol: 108 mg/dL — ABNORMAL HIGH (ref 0–99)
NonHDL: 126.25
Total CHOL/HDL Ratio: 4
Triglycerides: 89 mg/dL (ref 0.0–149.0)
VLDL: 17.8 mg/dL (ref 0.0–40.0)

## 2015-01-31 ENCOUNTER — Encounter: Payer: 59 | Admitting: Family Medicine

## 2015-02-01 ENCOUNTER — Encounter: Payer: Self-pay | Admitting: Family Medicine

## 2015-02-01 ENCOUNTER — Ambulatory Visit (INDEPENDENT_AMBULATORY_CARE_PROVIDER_SITE_OTHER): Payer: Commercial Managed Care - HMO | Admitting: Family Medicine

## 2015-02-01 ENCOUNTER — Other Ambulatory Visit: Payer: Self-pay | Admitting: Family Medicine

## 2015-02-01 VITALS — BP 102/67 | HR 97 | Temp 98.2°F | Ht 75.0 in | Wt 212.0 lb

## 2015-02-01 DIAGNOSIS — Z125 Encounter for screening for malignant neoplasm of prostate: Secondary | ICD-10-CM

## 2015-02-01 DIAGNOSIS — Z119 Encounter for screening for infectious and parasitic diseases, unspecified: Secondary | ICD-10-CM

## 2015-02-01 DIAGNOSIS — Z23 Encounter for immunization: Secondary | ICD-10-CM | POA: Diagnosis not present

## 2015-02-01 DIAGNOSIS — R030 Elevated blood-pressure reading, without diagnosis of hypertension: Secondary | ICD-10-CM

## 2015-02-01 DIAGNOSIS — IMO0001 Reserved for inherently not codable concepts without codable children: Secondary | ICD-10-CM

## 2015-02-01 DIAGNOSIS — Z0000 Encounter for general adult medical examination without abnormal findings: Secondary | ICD-10-CM

## 2015-02-01 LAB — PSA: PSA: 1.4 ng/mL (ref 0.10–4.00)

## 2015-02-01 MED ORDER — NICOTINE POLACRILEX 4 MG MT GUM: 4.0000 mg | CHEWING_GUM | OROMUCOSAL | Status: DC | PRN

## 2015-02-01 NOTE — Patient Instructions (Addendum)
Stop the lisinopril and see if the cough resolves and your BP stays okay (<140/<90). If troubles, then update me. Go to the lab on the way out.  We'll contact you with your lab report. Take care.  Glad to see you.  Thanks for your effort.

## 2015-02-01 NOTE — Progress Notes (Signed)
Pre visit review using our clinic review tool, if applicable. No additional management support is needed unless otherwise documented below in the visit note.  CPE- See plan.  Routine anticipatory guidance given to patient.  See health maintenance. Tetanus 2016 Shingles shot done 2015 PNA at 63.  Flu shot at clinic today (2016) Colonoscopy 2011 PSA screening d/w pt.  He wanted screening. Risk and benefits of screening d/w pt.   Pt opts in for HCV and HIV screening.  D/w pt re: routine screening.   Living will d/w pt. Wife designated if patient were incapacitated.  Diet and exercise d/w pt. Physical job, a lot of walking. Diet is improved.  D/w pt about CT screening for lung CA screening.  CXR not sufficient, he declined CT scanning.    Hypertension:    Using medication without problems or lightheadedness: rarely lightheaded.  Dry cough noted on the med, improved off ACE.  Chest pain with exertion:no Edema:no Short of breath:no He made diet changes.  His BP isn't high today.   He saw Dr. Lorelei Pont about his shoulder prev, with some improvement.     PMH and SH reviewed  Meds, vitals, and allergies reviewed.   ROS: See HPI.  Otherwise negative.    GEN: nad, alert and oriented HEENT: mucous membranes moist NECK: supple w/o LA CV: rrr. PULM: ctab, no inc wob ABD: soft, +bs EXT: no edema SKIN: no acute rash but he has a small cyst noted on the skin on the L lower side of the neck.  Isn't ttp, isn't draining.  Present for about 1 year per patient.

## 2015-02-02 LAB — HIV ANTIBODY (ROUTINE TESTING W REFLEX): HIV 1&2 Ab, 4th Generation: NONREACTIVE

## 2015-02-02 LAB — HEPATITIS C ANTIBODY: HCV Ab: NEGATIVE

## 2015-02-02 NOTE — Assessment & Plan Note (Signed)
At this point, stop ACE.  Likely causing cough.  He may not have elevated BP/HTN now with diet and lifestyle changes.  Update me if BP up or cough continues.

## 2015-02-02 NOTE — Assessment & Plan Note (Addendum)
Routine anticipatory guidance given to patient.  See health maintenance. Tetanus 2016 Shingles shot done 2015 PNA at 63.  Flu shot at clinic today (2016) Colonoscopy 2011 PSA screening d/w pt.  He wanted screening. Risk and benefits of screening d/w pt.   Pt opts in for HCV and HIV screening.  D/w pt re: routine screening.   Living will d/w pt. Wife designated if patient were incapacitated.  Diet and exercise d/w pt. Physical job, a lot of walking. Diet is improved.  D/w pt about CT screening for lung CA screening.  CXR not sufficient, he declined CT scanning.   I wouldn't intervene on the cyst on his skin now. Likely a benign seb cyst.  Can update me as needed.  He agrees.

## 2015-02-28 ENCOUNTER — Ambulatory Visit (INDEPENDENT_AMBULATORY_CARE_PROVIDER_SITE_OTHER): Payer: Commercial Managed Care - HMO | Admitting: Family Medicine

## 2015-02-28 ENCOUNTER — Encounter: Payer: Self-pay | Admitting: Family Medicine

## 2015-02-28 VITALS — BP 110/68 | HR 64 | Temp 98.4°F | Wt 207.2 lb

## 2015-02-28 DIAGNOSIS — B349 Viral infection, unspecified: Secondary | ICD-10-CM | POA: Diagnosis not present

## 2015-02-28 NOTE — Patient Instructions (Signed)
Likely a self resolving virus.  Plenty of fluids in the meantime and update me as needed.  Take care.  Glad to see you.

## 2015-02-28 NOTE — Progress Notes (Signed)
Pre visit review using our clinic review tool, if applicable. No additional management support is needed unless otherwise documented below in the visit note.  Sx started about 1 week ago.  Vomited, had dry heaves.  He took some ginger ale.  Had a posterior headache.  No true vertigo but no presyncope.  Sx off and on all week.  Could eat a 1/2 can of soup at a time, max, due to dec in appetite.  He stayed on clear liquids most of the week.  No fevers.  He took it easy most of the week.  "I don't feel 100% but some better today."  Still tapering off nicotine gum, but not a dramatic decrease.  Some feeling of water in the L ear.  He had some left over nausea meds that he used last week, unrecalled name.  No BRBPR.  No vomiting in the last ~5 days.    Meds, vitals, and allergies reviewed.   ROS: See HPI.  Otherwise, noncontributory  GEN: nad, alert and oriented HEENT: mucous membranes moist, tml wnl, nasal and OP exam with minimal irritation.  NECK: supple w/o LA CV: rrr.   PULM: ctab, no inc wob ABD: soft, +bs, no rebound, no masses.   EXT: no edema SKIN: no acute rash

## 2015-03-01 DIAGNOSIS — B349 Viral infection, unspecified: Secondary | ICD-10-CM | POA: Insufficient documentation

## 2015-03-01 NOTE — Assessment & Plan Note (Signed)
Improved now, was likely a self resolving virus. Plenty of fluids in the meantime and update me as needed.  He agrees.

## 2015-03-15 ENCOUNTER — Encounter: Payer: Self-pay | Admitting: Gastroenterology

## 2015-03-15 ENCOUNTER — Ambulatory Visit (AMBULATORY_SURGERY_CENTER): Payer: Self-pay

## 2015-03-15 VITALS — Ht 75.0 in | Wt 211.6 lb

## 2015-03-15 DIAGNOSIS — Z860100 Personal history of colon polyps, unspecified: Secondary | ICD-10-CM

## 2015-03-15 MED ORDER — SUPREP BOWEL PREP KIT 17.5-3.13-1.6 GM/177ML PO SOLN: 1.0000 | Freq: Once | ORAL | Status: DC

## 2015-03-15 NOTE — Progress Notes (Signed)
No allergies to eggs or soy No diet/weight loss meds No home oxygen No past problems with anesthesia  No internet 

## 2015-03-17 ENCOUNTER — Other Ambulatory Visit: Payer: Self-pay | Admitting: Family Medicine

## 2015-03-29 ENCOUNTER — Ambulatory Visit (AMBULATORY_SURGERY_CENTER): Payer: Commercial Managed Care - HMO | Admitting: Gastroenterology

## 2015-03-29 ENCOUNTER — Encounter: Payer: Self-pay | Admitting: Gastroenterology

## 2015-03-29 VITALS — BP 124/81 | HR 50 | Temp 98.4°F | Resp 14 | Ht 75.0 in | Wt 211.0 lb

## 2015-03-29 DIAGNOSIS — Z86010 Personal history of colonic polyps: Secondary | ICD-10-CM

## 2015-03-29 MED ORDER — SODIUM CHLORIDE 0.9 % IV SOLN: 500.0000 mL | INTRAVENOUS | Status: DC

## 2015-03-29 NOTE — Progress Notes (Signed)
Report to PACU, RN, vss, BBS= Clear.  

## 2015-03-29 NOTE — Patient Instructions (Signed)
Discharge instructions given. Handouts on diverticulosis and hemorrhoids. Resume previous medications. YOU HAD AN ENDOSCOPIC PROCEDURE TODAY AT THE [facility]:   Refer to the procedure report that was given to you for any specific questions about what was found during the examination.  If the procedure report does not answer your questions, please call your gastroenterologist to clarify.  If you requested that your care partner not be given the details of your procedure findings, then the procedure report has been included in a sealed envelope for you to review at your convenience later.  YOU SHOULD EXPECT: Some feelings of bloating in the abdomen. Passage of more gas than usual.  Walking can help get rid of the air that was put into your GI tract during the procedure and reduce the bloating. If you had a lower endoscopy (such as a colonoscopy or flexible sigmoidoscopy) you may notice spotting of blood in your stool or on the toilet paper. If you underwent a bowel prep for your procedure, you may not have a normal bowel movement for a few days.  Please Note:  You might notice some irritation and congestion in your nose or some drainage.  This is from the oxygen used during your procedure.  There is no need for concern and it should clear up in a day or so.  SYMPTOMS TO REPORT IMMEDIATELY:   Following lower endoscopy (colonoscopy or flexible sigmoidoscopy):  Excessive amounts of blood in the stool  Significant tenderness or worsening of abdominal pains  Swelling of the abdomen that is new, acute  Fever of 100F or higher   For urgent or emergent issues, a gastroenterologist can be reached at any hour by calling [phone].   DIET: Your first meal following the procedure should be a small meal and then it is ok to progress to your normal diet. Heavy or fried foods are harder to digest and may make you feel nauseous or bloated.  Likewise, meals heavy in dairy and vegetables can  increase bloating.  Drink plenty of fluids but you should avoid alcoholic beverages for 24 hours.  ACTIVITY:  You should plan to take it easy for the rest of today and you should NOT DRIVE or use heavy machinery until tomorrow (because of the sedation medicines used during the test).    FOLLOW UP: Our staff will call the number listed on your records the next business day following your procedure to check on you and address any questions or concerns that you may have regarding the information given to you following your procedure. If we do not reach you, we will leave a message.  However, if you are feeling well and you are not experiencing any problems, there is no need to return our call.  We will assume that you have returned to your regular daily activities without incident.  If any biopsies were taken you will be contacted by phone or by letter within the next 1-3 weeks.  Please call us at [phone] if you have not heard about the biopsies in 3 weeks.    SIGNATURES/CONFIDENTIALITY: You and/or your care partner have signed paperwork which will be entered into your electronic medical record.  These signatures attest to the fact that that the information above on your After Visit Summary has been reviewed and is understood.  Full responsibility of the confidentiality of this discharge information lies with you and/or your care-partner. 

## 2015-03-29 NOTE — Op Note (Signed)
[facility]  [address] [place], [id number]   COLONOSCOPY PROCEDURE REPORT  PATIENT: Stephen, Hanna  MR#: [id number] BIRTHDATE: [birth year] , 59  yrs. old GENDER: male ENDOSCOPIST: Ladene Artist, MD, [facility] PROCEDURE DATE:  03/29/2015 PROCEDURE:   Colonoscopy, surveillance First Screening Colonoscopy - Avg.  risk and is 50 yrs.  old or older - No.  Prior Negative Screening - Now for repeat screening. N/A  History of Adenoma - Now for follow-up colonoscopy & has been > or = to 3 yrs.  Yes hx of adenoma.  Has been 3 or more years since last colonoscopy.  Polyps removed today? No Recommend repeat exam, <10 yrs? No ASA CLASS:   Class II INDICATIONS:Surveillance due to prior colonic neoplasia and PH Colon Adenoma. MEDICATIONS: Monitored anesthesia care and Propofol 200 mg IV DESCRIPTION OF PROCEDURE:   After the risks benefits and alternatives of the procedure were thoroughly explained, informed consent was obtained.  The digital rectal exam revealed no abnormalities of the rectum.   The LB [id number]-H190 [id number]  endoscope was introduced through the anus and advanced to the cecum, which was identified by both the appendix and ileocecal valve. No adverse events experienced.   The quality of the prep was good.  (Suprep was used)  The instrument was then slowly withdrawn as the colon was fully examined. Estimated blood loss is zero unless otherwise noted in this procedure report.    COLON FINDINGS: There was moderate diverticulosis noted in the sigmoid colon and descending colon with associated muscular hypertrophy.   There was mild diverticulosis noted in the transverse colon.   The examination was otherwise normal. Retroflexed views revealed internal Grade I hemorrhoids. The time to cecum = 2.6 Withdrawal time = 9.5   The scope was withdrawn and the procedure completed. COMPLICATIONS: There were no immediate complications.  ENDOSCOPIC IMPRESSION: 1.   Moderate  diverticulosis in the sigmoid colon and descending colon 2.   Mild diverticulosis in the transverse colon 3.   Grade l internal hemorrhoids  RECOMMENDATIONS: 1.  High fiber diet with liberal fluid intake. 2.  Continue current colorectal screening with a repeat colonoscopy in 10 years.  eSigned:  Ladene Artist, MD, [facility] 03/29/2015 9:51 AM

## 2015-03-30 ENCOUNTER — Telehealth: Payer: Self-pay | Admitting: *Deleted

## 2015-03-30 NOTE — Telephone Encounter (Signed)
  Follow up Call-  Call back number 03/29/2015  Post procedure Call Back phone  # [phone]  Permission to leave phone message Yes     Patient questions:  Do you have a fever, pain , or abdominal swelling? No. Pain Score  0 *  Have you tolerated food without any problems? Yes.    Have you been able to return to your normal activities? Yes.    Do you have any questions about your discharge instructions: Diet   No. Medications  No. Follow up visit  No.  Do you have questions or concerns about your Care? No.  Actions: * If pain score is 4 or above: No action needed, pain <4.

## 2016-01-16 ENCOUNTER — Other Ambulatory Visit: Payer: Self-pay | Admitting: Family Medicine

## 2016-01-16 NOTE — Telephone Encounter (Signed)
Pt came off BP med sometime last year. Pt BP has continued to go up and pt started the medication back again on his own. He is now out of refills and request 5mg  refill to [facility]. Please advise  Pt request call back

## 2016-01-17 MED ORDER — LISINOPRIL 10 MG PO TABS: 5.0000 mg | ORAL_TABLET | Freq: Every day | ORAL | 1 refills | Status: DC

## 2016-01-17 NOTE — Telephone Encounter (Signed)
Patient notified as instructed by telephone and verbalized understanding. 

## 2016-01-17 NOTE — Telephone Encounter (Signed)
Sent. If blood pressure is not controlled, then needs follow-up.  I see that he has routine follow-up schedule. Thanks.

## 2016-01-29 ENCOUNTER — Other Ambulatory Visit: Payer: Self-pay | Admitting: Family Medicine

## 2016-01-29 DIAGNOSIS — R030 Elevated blood-pressure reading, without diagnosis of hypertension: Principal | ICD-10-CM

## 2016-01-29 DIAGNOSIS — IMO0001 Reserved for inherently not codable concepts without codable children: Secondary | ICD-10-CM

## 2016-02-02 ENCOUNTER — Other Ambulatory Visit: Payer: Self-pay | Admitting: Family Medicine

## 2016-02-02 ENCOUNTER — Other Ambulatory Visit (INDEPENDENT_AMBULATORY_CARE_PROVIDER_SITE_OTHER): Payer: Commercial Managed Care - HMO

## 2016-02-02 DIAGNOSIS — Z125 Encounter for screening for malignant neoplasm of prostate: Secondary | ICD-10-CM

## 2016-02-02 DIAGNOSIS — IMO0001 Reserved for inherently not codable concepts without codable children: Secondary | ICD-10-CM

## 2016-02-02 DIAGNOSIS — R030 Elevated blood-pressure reading, without diagnosis of hypertension: Secondary | ICD-10-CM | POA: Diagnosis not present

## 2016-02-02 LAB — PSA: PSA: 1.7 ng/mL (ref 0.10–4.00)

## 2016-02-02 LAB — COMPREHENSIVE METABOLIC PANEL
ALK PHOS: 44 U/L (ref 39–117)
ALT: 16 U/L (ref 0–53)
AST: 13 U/L (ref 0–37)
Albumin: 4 g/dL (ref 3.5–5.2)
BUN: 20 mg/dL (ref 6–23)
CO2: 29 meq/L (ref 19–32)
Calcium: 8.9 mg/dL (ref 8.4–10.5)
Chloride: 106 mEq/L (ref 96–112)
Creatinine, Ser: 0.92 mg/dL (ref 0.40–1.50)
GFR: 88.48 mL/min (ref >60.00)
GLUCOSE: 105 mg/dL — AB (ref 70–99)
POTASSIUM: 5.2 meq/L — AB (ref 3.5–5.1)
SODIUM: 141 meq/L (ref 135–145)
TOTAL PROTEIN: 6.3 g/dL (ref 6.0–8.3)
Total Bilirubin: 0.4 mg/dL (ref 0.2–1.2)

## 2016-02-02 LAB — LIPID PANEL
CHOL/HDL RATIO: 4
Cholesterol: 181 mg/dL (ref 0–200)
HDL: 44.2 mg/dL (ref >39.00)
LDL CALC: 121 mg/dL — AB (ref 0–99)
NONHDL: 137.28
Triglycerides: 82 mg/dL (ref 0.0–149.0)
VLDL: 16.4 mg/dL (ref 0.0–40.0)

## 2016-02-02 NOTE — Addendum Note (Signed)
Addended by: Marchia Bond on: 02/02/2016 09:10 AM   Modules accepted: Orders

## 2016-02-09 ENCOUNTER — Encounter: Payer: Self-pay | Admitting: Family Medicine

## 2016-02-09 ENCOUNTER — Ambulatory Visit (INDEPENDENT_AMBULATORY_CARE_PROVIDER_SITE_OTHER): Payer: Commercial Managed Care - HMO | Admitting: Family Medicine

## 2016-02-09 VITALS — BP 128/84 | HR 62 | Temp 97.6°F | Ht 75.0 in | Wt 215.0 lb

## 2016-02-09 DIAGNOSIS — M25579 Pain in unspecified ankle and joints of unspecified foot: Secondary | ICD-10-CM

## 2016-02-09 DIAGNOSIS — Z0000 Encounter for general adult medical examination without abnormal findings: Secondary | ICD-10-CM

## 2016-02-09 DIAGNOSIS — M545 Low back pain: Secondary | ICD-10-CM | POA: Diagnosis not present

## 2016-02-09 DIAGNOSIS — I10 Essential (primary) hypertension: Secondary | ICD-10-CM

## 2016-02-09 DIAGNOSIS — M25571 Pain in right ankle and joints of right foot: Secondary | ICD-10-CM | POA: Diagnosis not present

## 2016-02-09 DIAGNOSIS — Z87891 Personal history of nicotine dependence: Secondary | ICD-10-CM

## 2016-02-09 MED ORDER — LOSARTAN POTASSIUM 25 MG PO TABS: 12.5000 mg | ORAL_TABLET | Freq: Every day | ORAL | 3 refills | Status: DC

## 2016-02-09 MED ORDER — BETAMETHASONE DIPROPIONATE 0.05 % EX OINT: TOPICAL_OINTMENT | Freq: Every day | CUTANEOUS | Status: DC

## 2016-02-09 NOTE — Progress Notes (Signed)
CPE- See plan.  Routine anticipatory guidance given to patient.  See health maintenance. Tetanus 2016 Shingles shot done 2015 PNA at 63.  Flu shot at clinic today Colonoscopy 2016 PSA screening d/w pt.  He wanted screening. Risk and benefits of screening d/w pt.  PSA wnl.  HCV and HIV screening prev done.  Living will d/w pt. Wife designated if patient were incapacitated.  Diet and exercise d/w pt. Physical job, a lot of walking. Diet is fair, d/w pt.   D/w pt about CT screening for lung CA screening.  CXR not sufficient, d/w pt re: CT scanning. ~40 year hx smoking.  He has stopped smoking.    Hypertension:    Using medication without problems or lightheadedness: see below.   Chest pain with exertion:no Edema:no Short of breath:no He had a cough, while on ACE.  Cough better off ACE.  D/w pt.  See AVS and med list.   Back pain.  Taking aleve once a day.  No ADE on med.  He is getting by.    Occ bruising on the extensor side of arms, no other bleeding or bruising.  D/w pt, ie likely benign process.    R foot pain, plantar side near distal MT heads.  Intermittently painful, better if not weight bearing.  Had been using biofreeze.  No tingling.  No trauma.  No weakness.    PMH and SH reviewed  Meds, vitals, and allergies reviewed.   ROS: Per HPI.  Unless specifically indicated otherwise in HPI, the patient denies:  General: fever. Eyes: acute vision changes ENT: sore throat Cardiovascular: chest pain Respiratory: SOB GI: vomiting GU: dysuria Musculoskeletal: acute back pain Derm: acute rash Neuro: acute motor dysfunction Psych: worsening mood Endocrine: polydipsia Heme: bleeding Allergy: hayfever  GEN: nad, alert and oriented HEENT: mucous membranes moist NECK: supple w/o LA CV: rrr. PULM: ctab, no inc wob ABD: soft, +bs EXT: no edema SKIN: no acute rash, knee callous resolved in the meantime.   Small bruising on the R forearm, extensor side.  No other  bruising.  B foot exam with loss of transverse arch R>L foot w/o ulceration. Normal DP pulses B.

## 2016-02-09 NOTE — Progress Notes (Signed)
Pre visit review using our clinic review tool, if applicable. No additional management support is needed unless otherwise documented below in the visit note. 

## 2016-02-09 NOTE — Patient Instructions (Addendum)
Stop lisinopril, change to losartan 12.5mg  a day, up to 25mg  if needed.  Update me as needed.   Rosaria Ferries will call about your referral. Get soft full length arch support inserts and if not better, then see Dr. Lorelei Pont.  Take care.  Glad to see you.

## 2016-02-10 DIAGNOSIS — Z87891 Personal history of nicotine dependence: Secondary | ICD-10-CM | POA: Insufficient documentation

## 2016-02-10 DIAGNOSIS — M25579 Pain in unspecified ankle and joints of unspecified foot: Secondary | ICD-10-CM | POA: Insufficient documentation

## 2016-02-10 NOTE — Assessment & Plan Note (Signed)
Continue daily Aleve. He takes a low dose. He does have some relief. Creatinine is still normal.

## 2016-02-10 NOTE — Assessment & Plan Note (Signed)
Tetanus 2016 Shingles shot done 2015 PNA at 63.  Flu shot at clinic today Colonoscopy 2016 PSA screening d/w pt.  He wanted screening. Risk and benefits of screening d/w pt.  PSA wnl.  HCV and HIV screening prev done.  Living will d/w pt. Wife designated if patient were incapacitated.  Diet and exercise d/w pt. Physical job, a lot of walking. Diet is fair, d/w pt.   D/w pt about CT screening for lung CA screening.  CXR not sufficient, d/w pt re: CT scanning. ~40 year hx smoking.  He has stopped smoking.

## 2016-02-10 NOTE — Assessment & Plan Note (Signed)
Refer for lung cancer screening program. Discussed with patient.

## 2016-02-10 NOTE — Assessment & Plan Note (Signed)
Stop ACE inhibitor. Change to ARB. See after visit summary. He had cough on ACE inhibitor. Okay for outpatient follow-up. He'll update me as needed.

## 2016-02-10 NOTE — Assessment & Plan Note (Signed)
Likely has pain from dropped metatarsal heads causing distal metatarsalgia. Discussed with patient. I want him to get full-length soft or support inserts and use those. If not better I want him to see Dr. Edilia Bo. He agrees. No need for imaging.

## 2016-02-14 ENCOUNTER — Telehealth: Payer: Self-pay | Admitting: *Deleted

## 2016-02-14 NOTE — Telephone Encounter (Signed)
Received referral for initial lung cancer screening scan. Contacted patient and obtained smoking history,(former, quit 01/28/15, 30 pack year) as well as answering questions related to screening process. Patient denies signs of lung cancer such as weight loss or hemoptysis. Patient denies comorbidity that would prevent curative treatment if lung cancer were found. Patient is tentatively scheduled for shared decision making visit and CT scan on 02/21/16 at 1:30pm, pending insurance approval from business office.

## 2016-02-20 ENCOUNTER — Other Ambulatory Visit: Payer: Self-pay | Admitting: *Deleted

## 2016-02-20 DIAGNOSIS — Z87891 Personal history of nicotine dependence: Secondary | ICD-10-CM

## 2016-02-21 ENCOUNTER — Ambulatory Visit
Admission: RE | Admit: 2016-02-21 | Discharge: 2016-02-21 | Disposition: A | Discharge status: Home / Self Care | Payer: Commercial Managed Care - HMO | Source: Ambulatory Visit | Attending: Oncology | Admitting: Oncology

## 2016-02-21 ENCOUNTER — Inpatient Hospital Stay: Payer: Commercial Managed Care - HMO | Attending: Oncology | Admitting: Oncology

## 2016-02-21 DIAGNOSIS — Z87891 Personal history of nicotine dependence: Secondary | ICD-10-CM | POA: Diagnosis present

## 2016-02-21 DIAGNOSIS — Z122 Encounter for screening for malignant neoplasm of respiratory organs: Secondary | ICD-10-CM

## 2016-02-23 ENCOUNTER — Telehealth: Payer: Self-pay | Admitting: *Deleted

## 2016-02-23 NOTE — Telephone Encounter (Signed)
Noted. Thanks.

## 2016-02-23 NOTE — Telephone Encounter (Signed)
Voicemail left at # in EMR in attempt to notify patient of LDCT lung cancer screening results with recommendation for 12 month follow up imaging. This note will be forwarded to PCP via Epic.  IMPRESSION: No suspicious pulmonary nodules. Lung-RADS Category 1, negative. Continue annual screening with low-dose chest CT without contrast in 12 months.

## 2016-02-24 DIAGNOSIS — Z87891 Personal history of nicotine dependence: Secondary | ICD-10-CM | POA: Insufficient documentation

## 2016-02-24 NOTE — Progress Notes (Signed)
In accordance with CMS guidelines, patient has met eligibility criteria including age, absence of signs or symptoms of lung cancer.  Social History  Substance Use Topics  . Smoking status: Former Smoker    Packs/day: 0.50    Years: 30.00    Types: Cigarettes    Quit date: 01/28/2015  . Smokeless tobacco: Former Systems developer    Types: Chew  . Alcohol use 0.6 oz/week    1 Standard drinks or equivalent per week     Comment: 2-3 beers a month     A shared decision-making session was conducted prior to the performance of CT scan. This includes one or more decision aids, includes benefits and harms of screening, follow-up diagnostic testing, over-diagnosis, false positive rate, and total radiation exposure.  Counseling on the importance of adherence to annual lung cancer LDCT screening, impact of co-morbidities, and ability or willingness to undergo diagnosis and treatment is imperative for compliance of the program.  Counseling on the importance of continued smoking cessation for former smokers; the importance of smoking cessation for current smokers, and information about tobacco cessation interventions have been given to patient including [place] and [facility] quit [place] programs.  Written order for lung cancer screening with LDCT has been given to the patient and any and all questions have been answered to the best of my abilities.   Yearly follow up will be coordinated by Burgess Estelle, Thoracic Navigator.

## 2016-10-12 ENCOUNTER — Ambulatory Visit (INDEPENDENT_AMBULATORY_CARE_PROVIDER_SITE_OTHER): Payer: 59 | Admitting: Family Medicine

## 2016-10-12 ENCOUNTER — Encounter: Payer: Self-pay | Admitting: Family Medicine

## 2016-10-12 DIAGNOSIS — J069 Acute upper respiratory infection, unspecified: Secondary | ICD-10-CM

## 2016-10-12 MED ORDER — FLUTICASONE PROPIONATE 50 MCG/ACT NA SUSP: 2.0000 | Freq: Every day | NASAL | 1 refills | Status: DC

## 2016-10-12 MED ORDER — AMOXICILLIN-POT CLAVULANATE 875-125 MG PO TABS: 1.0000 | ORAL_TABLET | Freq: Two times a day (BID) | ORAL | 0 refills | Status: DC

## 2016-10-12 NOTE — Patient Instructions (Signed)
Start augmentin.  Use flonase and gently try to pop your ears.   Rest and fluids in the meantime.   Take care.  Glad to see you.

## 2016-10-12 NOTE — Progress Notes (Signed)
duration of symptoms: about 1 week. First noted congestion, then sx listed below Rhinorrhea: yes congestion:yes ear pain: yes sore throat:yes, esp at night.   Cough: yes, some sputum, discolored.   Myalgias: no No fevers.   other concerns: taking dayquil w/o relief.   At this point, he isn't getting any better.   Fatigued.   Voice is altered.  No vomiting, no diarrhea, no rash.   Mother in law and father in law recently hospitalized; mother in law recently died.  Condolences offered.    Per HPI unless specifically indicated in ROS section   Meds, vitals, and allergies reviewed.   GEN: nad, alert and oriented HEENT: mucous membranes moist, TM w/o erythema, nasal epithelium injected, OP with cobblestoning, sinuses not ttp NECK: supple w/o LA CV: rrr. PULM: ctab, no inc wob ABD: soft, +bs EXT: no edema Benign senile purpura noted on the B arms.

## 2016-10-13 NOTE — Assessment & Plan Note (Signed)
Given the duration would treat. Discussed with patient. Nontoxic. Okay for outpatient follow-up. Start augmentin.  Use flonase and gently try to pop ears with Valsalva maneuver.   Rest and fluids in the meantime.

## 2016-10-19 ENCOUNTER — Encounter: Payer: Self-pay | Admitting: Family Medicine

## 2016-10-19 ENCOUNTER — Ambulatory Visit (INDEPENDENT_AMBULATORY_CARE_PROVIDER_SITE_OTHER): Payer: 59 | Admitting: Family Medicine

## 2016-10-19 DIAGNOSIS — H6980 Other specified disorders of Eustachian tube, unspecified ear: Secondary | ICD-10-CM | POA: Diagnosis not present

## 2016-10-19 MED ORDER — PREDNISONE 20 MG PO TABS: ORAL_TABLET | ORAL | 0 refills | Status: DC

## 2016-10-19 NOTE — Progress Notes (Signed)
Since last week, started on augmentin.  He isn't as hoarse, some AM sputum but some better later in the day.  L ear isn't painful but he can't hear well from that side.  R ear isn't as stuffy.  No vomiting, no diarrhea.  No ADE on abx.  He can't get the L ear to pop.    Taking 1 naproxen a day, at baseline.  Already using flonase.    Meds, vitals, and allergies reviewed.   ROS: Per HPI unless specifically indicated in ROS section   GEN: nad, alert and oriented HEENT: mucous membranes moist, tm w/o erythema, nasal exam w/o erythema, scant clear discharge noted,  OP with cobblestoning, no movement of the left eardrum with Valsalva NECK: supple w/o LA CV: rrr.   PULM: ctab, no inc wob

## 2016-10-19 NOTE — Patient Instructions (Signed)
Prednisone with food, skip naproxen for now, keep trying to pop your ears gently.  Finish the antibiotics.  If not better by the end of next week, then call and we'll help set up an ENT appointment.  Take care.  Glad to see you.

## 2016-10-21 DIAGNOSIS — J3489 Other specified disorders of nose and nasal sinuses: Secondary | ICD-10-CM | POA: Insufficient documentation

## 2016-10-21 NOTE — Assessment & Plan Note (Signed)
Anatomy discussed with patient.Prednisone with food, skip naproxen for now, keep trying to pop ears gently.  Finish the antibiotics as planned.   If not better by the end of next week, then call and we'll help set up an ENT appointment.  He agrees.  Noted that Weber testing is louder on the left side, as expected, meaning he does not have true acute sensorineural hearing loss.

## 2016-10-29 ENCOUNTER — Ambulatory Visit (INDEPENDENT_AMBULATORY_CARE_PROVIDER_SITE_OTHER)
Admission: RE | Admit: 2016-10-29 | Discharge: 2016-10-29 | Disposition: A | Discharge status: Home / Self Care | Payer: 59 | Source: Ambulatory Visit | Attending: Family Medicine | Admitting: Family Medicine

## 2016-10-29 ENCOUNTER — Ambulatory Visit (INDEPENDENT_AMBULATORY_CARE_PROVIDER_SITE_OTHER): Payer: 59 | Admitting: Family Medicine

## 2016-10-29 ENCOUNTER — Encounter: Payer: Self-pay | Admitting: Family Medicine

## 2016-10-29 VITALS — BP 122/82 | HR 72 | Temp 97.9°F | Wt 210.2 lb

## 2016-10-29 DIAGNOSIS — R05 Cough: Secondary | ICD-10-CM | POA: Diagnosis not present

## 2016-10-29 DIAGNOSIS — R062 Wheezing: Secondary | ICD-10-CM | POA: Diagnosis not present

## 2016-10-29 MED ORDER — DOXYCYCLINE HYCLATE 100 MG PO TABS: 100.0000 mg | ORAL_TABLET | Freq: Two times a day (BID) | ORAL | 0 refills | Status: DC

## 2016-10-29 MED ORDER — ALBUTEROL SULFATE HFA 108 (90 BASE) MCG/ACT IN AERS: 1.0000 | INHALATION_SPRAY | Freq: Three times a day (TID) | RESPIRATORY_TRACT | 0 refills | Status: DC | PRN

## 2016-10-29 NOTE — Progress Notes (Signed)
He wasn't wheezing as much at the last OV, but more in the meantime and now, worse at night.  Some green sputum in the AM and late evening.  If he gets hot, then the cough gets worse.  No fevers.  Not SOB.  H/o known smoking in the past, but this is clearly different from the past and from recent OVs  L ear is some better.  He felt jittery on the prednisone, but is done with med now.    No vomiting, no diarrhea.  Still having some green rhinorrhea.  No ST.  Voice is a little hoarse.    Meds, vitals, and allergies reviewed.   ROS: Per HPI unless specifically indicated in ROS section   GEN: nad, alert and oriented HEENT: mucous membranes moist, tm w/o erythema, nasal exam w/o erythema, clear discharge noted,  OP with cobblestoning, sinuses not ttp NECK: supple w/o LA CV: rrr.   PULM: Exp wheeze noted but o/w ctab, no inc wob, some cough noted, speaking in complete sentences.  EXT: no edema SKIN: no acute rash Weber testing equal now.

## 2016-10-29 NOTE — Patient Instructions (Signed)
Change to doxycycline.  Sunburn caution.  Go to the lab on the way out.  We'll contact you with your xray report. Update me if getting worse as the week goes on.  Use the inhaler in the meantime.

## 2016-10-30 DIAGNOSIS — R062 Wheezing: Secondary | ICD-10-CM | POA: Insufficient documentation

## 2016-10-30 NOTE — Assessment & Plan Note (Addendum)
Start albuterol, routine cautions given. Discussed with patient. He agrees. Given the green sputum and the persistence of symptoms and the wheeze and his smoking history, start doxycycline. Routine cautions given. Check chest x-ray. See notes on imaging. Nontoxic. Okay for outpatient follow-up. He agrees.

## 2016-11-08 ENCOUNTER — Telehealth: Payer: Self-pay

## 2016-11-08 DIAGNOSIS — H6980 Other specified disorders of Eustachian tube, unspecified ear: Secondary | ICD-10-CM

## 2016-11-08 NOTE — Telephone Encounter (Signed)
Pt left v/m; pt was seen 10/12/16, 10/19/16 and 10/29/16; pt has taken 3 different meds, still has infection and blowing nose alot and request if needs different med or what to do next. Pt request cb.

## 2016-11-08 NOTE — Telephone Encounter (Signed)
Patient advised and asks if it can be scheduled in [place].

## 2016-11-08 NOTE — Telephone Encounter (Signed)
We need to get him to ENT.  I put in the referral.  I asked to get him in asap.  Thanks.

## 2016-11-09 DIAGNOSIS — J018 Other acute sinusitis: Secondary | ICD-10-CM | POA: Diagnosis not present

## 2016-11-16 DIAGNOSIS — L400 Psoriasis vulgaris: Secondary | ICD-10-CM | POA: Diagnosis not present

## 2016-11-16 DIAGNOSIS — L570 Actinic keratosis: Secondary | ICD-10-CM | POA: Diagnosis not present

## 2016-11-19 ENCOUNTER — Ambulatory Visit (INDEPENDENT_AMBULATORY_CARE_PROVIDER_SITE_OTHER): Payer: 59 | Admitting: Family Medicine

## 2016-11-19 ENCOUNTER — Encounter: Payer: Self-pay | Admitting: Family Medicine

## 2016-11-19 DIAGNOSIS — J3489 Other specified disorders of nose and nasal sinuses: Secondary | ICD-10-CM

## 2016-11-19 MED ORDER — LEVOFLOXACIN 500 MG PO TABS: 500.0000 mg | ORAL_TABLET | Freq: Every day | ORAL | 0 refills | Status: DC

## 2016-11-19 NOTE — Progress Notes (Signed)
Prev CXR unremarkable.  Seen by ENT 11/09/16, started on augmentin.  Done with that as of today.  Minimal improvement in the meantime.  Still with voice changes. Still coughing and wheezing but slightly less than prev.  Still with sig nasal green discharge.  No fevers.  No facial pain.  Prev on prednisone.    Meds, vitals, and allergies reviewed.   ROS: Per HPI unless specifically indicated in ROS section   GEN: nad, alert and oriented HEENT: mucous membranes moist, tm w/o erythema, nasal exam w/o erythema, clear discharge noted,  OP with cobblestoning NECK: supple w/o LA CV: rrr.   PULM: ctab, no inc wob EXT: no edema

## 2016-11-19 NOTE — Patient Instructions (Signed)
Start the levaquin in about two days and if not better then we'll either need to get you back to ENT or the allergy clinic.  Take care.  Glad to see you.

## 2016-11-20 NOTE — Assessment & Plan Note (Signed)
Continues, purulent, nontoxic. Discussed with patient about options. At this point reasonable to trial of Levaquin if he does not improve in another day or 2. Reasonable to start probiotic in the meantime given his recent antibiotic use. If not improved we need to get him back to ENT or over the allergy clinic. He agrees. Okay for outpatient follow-up.

## 2017-02-03 ENCOUNTER — Other Ambulatory Visit: Payer: Self-pay | Admitting: Family Medicine

## 2017-02-03 DIAGNOSIS — I10 Essential (primary) hypertension: Secondary | ICD-10-CM

## 2017-02-03 DIAGNOSIS — Z125 Encounter for screening for malignant neoplasm of prostate: Secondary | ICD-10-CM

## 2017-02-07 ENCOUNTER — Other Ambulatory Visit (INDEPENDENT_AMBULATORY_CARE_PROVIDER_SITE_OTHER): Payer: 59

## 2017-02-07 DIAGNOSIS — I10 Essential (primary) hypertension: Secondary | ICD-10-CM

## 2017-02-07 DIAGNOSIS — Z125 Encounter for screening for malignant neoplasm of prostate: Secondary | ICD-10-CM | POA: Diagnosis not present

## 2017-02-07 LAB — COMPREHENSIVE METABOLIC PANEL
ALT: 12 U/L (ref 0–53)
AST: 9 U/L (ref 0–37)
Albumin: 4.3 g/dL (ref 3.5–5.2)
Alkaline Phosphatase: 49 U/L (ref 39–117)
BILIRUBIN TOTAL: 0.4 mg/dL (ref 0.2–1.2)
BUN: 20 mg/dL (ref 6–23)
CO2: 28 meq/L (ref 19–32)
CREATININE: 0.89 mg/dL (ref 0.40–1.50)
Calcium: 9.5 mg/dL (ref 8.4–10.5)
Chloride: 106 mEq/L (ref 96–112)
GFR: 91.63 mL/min (ref >60.00)
GLUCOSE: 111 mg/dL — AB (ref 70–99)
Potassium: 4.8 mEq/L (ref 3.5–5.1)
SODIUM: 141 meq/L (ref 135–145)
Total Protein: 6.6 g/dL (ref 6.0–8.3)

## 2017-02-07 LAB — PSA: PSA: 1.89 ng/mL (ref 0.10–4.00)

## 2017-02-07 LAB — LIPID PANEL
CHOL/HDL RATIO: 4
Cholesterol: 175 mg/dL (ref 0–200)
HDL: 42.3 mg/dL (ref >39.00)
LDL Cholesterol: 115 mg/dL — ABNORMAL HIGH (ref 0–99)
NONHDL: 132.93
Triglycerides: 88 mg/dL (ref 0.0–149.0)
VLDL: 17.6 mg/dL (ref 0.0–40.0)

## 2017-02-11 ENCOUNTER — Ambulatory Visit (INDEPENDENT_AMBULATORY_CARE_PROVIDER_SITE_OTHER): Payer: 59 | Admitting: Family Medicine

## 2017-02-11 ENCOUNTER — Encounter: Payer: Self-pay | Admitting: Family Medicine

## 2017-02-11 VITALS — BP 136/90 | HR 63 | Temp 97.7°F | Ht 75.0 in | Wt 213.0 lb

## 2017-02-11 DIAGNOSIS — Z23 Encounter for immunization: Secondary | ICD-10-CM | POA: Diagnosis not present

## 2017-02-11 DIAGNOSIS — Z0000 Encounter for general adult medical examination without abnormal findings: Secondary | ICD-10-CM

## 2017-02-11 DIAGNOSIS — I10 Essential (primary) hypertension: Secondary | ICD-10-CM

## 2017-02-11 DIAGNOSIS — Z7189 Other specified counseling: Secondary | ICD-10-CM

## 2017-02-11 DIAGNOSIS — M545 Low back pain: Secondary | ICD-10-CM

## 2017-02-11 MED ORDER — LOSARTAN POTASSIUM 25 MG PO TABS: 12.5000 mg | ORAL_TABLET | Freq: Every day | ORAL | 3 refills | Status: DC

## 2017-02-11 NOTE — Progress Notes (Signed)
CPE- See plan.  Routine anticipatory guidance given to patient.  See health maintenance.  The possibility exists that previously documented standard health maintenance information may have been brought forward from a previous encounter into this note.  If needed, that same information has been updated to reflect the current situation based on today's encounter.    Tetanus 2016 Shingles shot done 2015 PNA at 63.  Flu shot at clinic today Colonoscopy 2016 PSA screening d/w pt. He wanted screening. Risk and benefits of screening d/w pt. PSA wnl.  HCV and HIV screening prev done.  Living will d/w pt. Wife designated if patient were incapacitated.  Diet and exercise d/w pt. Physical job, a lot of walking at work. Diet is "about the same, fair", d/w pt.   D/w pt about CT screening for lung CA screening. ~40 year hx smoking.  He has stopped smoking.  He should get a call about the CT and I'll await the notes/scan.   AAA screening not yet due, d/w pt.    Hypertension:    Using medication without problems or lightheadedness: yes Chest pain with exertion:no Edema:no Short of breath:no Average home BPs: checked episodically, usually lower than our reading today when checked at home.    Back pain.  Taking naproxen and he keeps moving.  No new sx in his back.    PMH and SH reviewed  Meds, vitals, and allergies reviewed.   ROS: Per HPI.  Unless specifically indicated otherwise in HPI, the patient denies:  General: fever. Eyes: acute vision changes ENT: sore throat Cardiovascular: chest pain Respiratory: SOB GI: vomiting GU: dysuria Musculoskeletal: acute back pain Derm: acute rash Neuro: acute motor dysfunction Psych: worsening mood Endocrine: polydipsia Heme: bleeding Allergy: hayfever  GEN: nad, alert and oriented HEENT: mucous membranes moist NECK: supple w/o LA CV: rrr. PULM: ctab, no inc wob ABD: soft, +bs EXT: no edema SKIN: no acute rash

## 2017-02-11 NOTE — Patient Instructions (Addendum)
If you don't get a call about the CT, then let me know.   Check with your insurance to see if they will cover the shingrix shot. If BP is consistently >140/>90, then go up to a whole tab of losartan and update me.   Take care.  Glad to see you.  Update me as needed.

## 2017-02-12 NOTE — Assessment & Plan Note (Signed)
Taking naproxen and he keeps moving.  No new sx in his back.   Continue as is.  He agrees.

## 2017-02-12 NOTE — Assessment & Plan Note (Signed)
Living will d/w pt.  Wife designated if patient were incapacitated.   ?

## 2017-02-12 NOTE — Assessment & Plan Note (Signed)
BPs checked episodically, usually lower than our reading today when checked at home.  Continue as is.  If consistently elevated, then inc losartan to 25mg  a day and update me.  He agrees.

## 2017-02-12 NOTE — Assessment & Plan Note (Signed)
Tetanus 2016 Shingles shot done 2015 PNA at 63.  Flu shot at clinic today Colonoscopy 2016 PSA screening d/w pt. He wanted screening. Risk and benefits of screening d/w pt. PSA wnl.  HCV and HIV screening prev done.  Living will d/w pt. Wife designated if patient were incapacitated.  Diet and exercise d/w pt. Physical job, a lot of walking at work. Diet is "about the same, fair", d/w pt.   D/w pt about CT screening for lung CA screening. ~40 year hx smoking.  He has stopped smoking.  He should get a call about the CT and I'll await the notes/scan.   AAA screening not yet due, d/w pt.

## 2017-02-14 ENCOUNTER — Telehealth: Payer: Self-pay | Admitting: *Deleted

## 2017-02-14 DIAGNOSIS — Z87891 Personal history of nicotine dependence: Secondary | ICD-10-CM

## 2017-02-14 DIAGNOSIS — Z122 Encounter for screening for malignant neoplasm of respiratory organs: Secondary | ICD-10-CM

## 2017-02-14 NOTE — Telephone Encounter (Signed)
Notified patient that annual lung cancer screening low dose CT scan is due currently or will be in near future. Confirmed that patient is within the age range of 55-77, and asymptomatic, (no signs or symptoms of lung cancer). Patient denies illness that would prevent curative treatment for lung cancer if found. Verified smoking history, (former, quit 01/28/15, 30 pack year). The shared decision making visit was done 02/21/16. Patient is agreeable for CT scan being scheduled.

## 2017-02-22 ENCOUNTER — Ambulatory Visit
Admission: RE | Admit: 2017-02-22 | Discharge: 2017-02-22 | Disposition: A | Discharge status: Home / Self Care | Payer: 59 | Source: Ambulatory Visit | Attending: Nurse Practitioner | Admitting: Nurse Practitioner

## 2017-02-22 DIAGNOSIS — Z87891 Personal history of nicotine dependence: Secondary | ICD-10-CM | POA: Diagnosis not present

## 2017-02-22 DIAGNOSIS — J432 Centrilobular emphysema: Secondary | ICD-10-CM | POA: Insufficient documentation

## 2017-02-22 DIAGNOSIS — Z122 Encounter for screening for malignant neoplasm of respiratory organs: Secondary | ICD-10-CM | POA: Diagnosis not present

## 2017-02-25 ENCOUNTER — Encounter: Payer: Self-pay | Admitting: *Deleted

## 2017-03-16 ENCOUNTER — Other Ambulatory Visit: Payer: Self-pay | Admitting: Family Medicine

## 2017-03-25 ENCOUNTER — Other Ambulatory Visit: Payer: Self-pay | Admitting: *Deleted

## 2017-03-25 MED ORDER — LOSARTAN POTASSIUM 25 MG PO TABS: 12.5000 mg | ORAL_TABLET | Freq: Every day | ORAL | 3 refills | Status: DC

## 2017-05-06 ENCOUNTER — Ambulatory Visit: Payer: 59 | Admitting: Family Medicine

## 2017-05-06 ENCOUNTER — Encounter: Payer: Self-pay | Admitting: Family Medicine

## 2017-05-06 DIAGNOSIS — M25561 Pain in right knee: Secondary | ICD-10-CM | POA: Diagnosis not present

## 2017-05-06 MED ORDER — PREDNISONE 20 MG PO TABS
20.0000 mg | ORAL_TABLET | Freq: Every day | ORAL | 0 refills | Status: DC
Start: 2017-05-06 — End: 2018-02-17

## 2017-05-06 NOTE — Progress Notes (Signed)
R knee pain.  Flared up 3 days ago, was painting trim/baseboard, on his knees a lot.  He can get up but with sig pain.  Iced in the meantime.  Taking nsaid.  No other trauma.  L knee is okay.    BP has generally been controlled on 12.5mg  losartan.  D/w pt.    He is supposed to work tomorrow, d/w pt; he may be able to lay off tomorrow.    No fevers.    Meds, vitals, and allergies reviewed.   ROS: Per HPI unless specifically indicated in ROS section   nad R knee full extension but pressure with flex >90 deg.   Can bear weight.   Warm and pink and tender locally near the right tibial tubercle but smaller area than yesterday per patient report, 5x5cm today.

## 2017-05-06 NOTE — Patient Instructions (Signed)
Stop aleve for now.  Prednisone with food.  Use knee pads when back to work.  Avoid local pressure and limit movement today (stairs, squats, etc). If any fever or more pain or spreading redness, then get rechecked.  Take care.  Glad to see you.

## 2017-05-08 NOTE — Assessment & Plan Note (Signed)
Is some better from yesterday.  This looks to be inflamed locally, likely with bursitis, but does not appear to be infected.  No fevers.  No sign of infection in the joint.  The joint itself does not have acute findings.  Discussed with patient about options.  Reasonable for short course of prednisone, avoid NSAIDs in the meantime, use ice as needed.  Limit pressure on the area, limit squatting, avoid painting baseboards, etc.  He agrees.  No need for imaging at this point.

## 2017-05-17 ENCOUNTER — Telehealth: Payer: Self-pay | Admitting: Family Medicine

## 2017-05-17 MED ORDER — AMOXICILLIN-POT CLAVULANATE 875-125 MG PO TABS: 1.0000 | ORAL_TABLET | Freq: Two times a day (BID) | ORAL | 0 refills | Status: DC

## 2017-05-17 NOTE — Telephone Encounter (Signed)
Patient advised.

## 2017-05-17 NOTE — Telephone Encounter (Signed)
I spoke with pt and offered an appt to be seen this morning. Pt said he has never asked for med without being seen but wants request sent to Dr Damita Dunnings; pt has no fever , when blows nose has green mucus. S/t pain at 5. No cough. Pt has been gargling with warm salt water. [facility].

## 2017-05-17 NOTE — Telephone Encounter (Signed)
Start augmentin and recheck at the end of the abx if not sig better.  Get checked in the meantime if worse. Thanks.

## 2017-05-17 NOTE — Telephone Encounter (Signed)
Copied from [place] [phone]. Topic: Quick Communication - See Telephone Encounter >> May 17, 2017  8:07 AM Ether Griffins B wrote: CRM for notification. See Telephone encounter for:  Pt calling stating he has a sinus infection and sore throat. He is wanting to know if something can be called in for him. I offered him the same day with Carlean Purl. But he would rather see if something can be called in first. [facility]  05/17/17.

## 2017-05-28 DIAGNOSIS — M7041 Prepatellar bursitis, right knee: Secondary | ICD-10-CM | POA: Diagnosis not present

## 2017-11-14 DIAGNOSIS — D2261 Melanocytic nevi of right upper limb, including shoulder: Secondary | ICD-10-CM | POA: Diagnosis not present

## 2017-11-14 DIAGNOSIS — D2262 Melanocytic nevi of left upper limb, including shoulder: Secondary | ICD-10-CM | POA: Diagnosis not present

## 2017-11-14 DIAGNOSIS — D225 Melanocytic nevi of trunk: Secondary | ICD-10-CM | POA: Diagnosis not present

## 2018-02-08 ENCOUNTER — Telehealth: Payer: Self-pay

## 2018-02-08 NOTE — Telephone Encounter (Signed)
Call pt regarding lung screening. Spoke with pt wife will call back on Monday. Pt is a former smoker.

## 2018-02-11 ENCOUNTER — Telehealth: Payer: Self-pay | Admitting: *Deleted

## 2018-02-11 ENCOUNTER — Encounter: Payer: Self-pay | Admitting: *Deleted

## 2018-02-11 DIAGNOSIS — Z122 Encounter for screening for malignant neoplasm of respiratory organs: Secondary | ICD-10-CM

## 2018-02-11 NOTE — Telephone Encounter (Signed)
Patient has been notified that the annual lung cancer screening low dose CT scan is due currently or will be in the near future.  Confirmed that the patient is within the age range of 60-80, and asymptomatic, and currently exhibits no signs or symptoms of lung cancer.  Patient denies illness that would prevent curative treatment for lung cancer if found.  Verified smoking history, former smoker with 30 pkyr history .  The shared decision making visit was completed on 02-21-16.  Patient is agreeable for the CT scan to be scheduled.  Will call patient back with date and time of appointment.

## 2018-02-11 NOTE — Telephone Encounter (Signed)
Attempted to contact patient r/t LDCT Screening follow up due at this time.  No answer received, message left for patient to call [phone] to schedule appointment.    

## 2018-02-13 ENCOUNTER — Other Ambulatory Visit: Payer: Self-pay | Admitting: Family Medicine

## 2018-02-13 DIAGNOSIS — I10 Essential (primary) hypertension: Secondary | ICD-10-CM

## 2018-02-13 DIAGNOSIS — Z125 Encounter for screening for malignant neoplasm of prostate: Secondary | ICD-10-CM

## 2018-02-14 ENCOUNTER — Other Ambulatory Visit (INDEPENDENT_AMBULATORY_CARE_PROVIDER_SITE_OTHER): Payer: 59

## 2018-02-14 DIAGNOSIS — I10 Essential (primary) hypertension: Secondary | ICD-10-CM

## 2018-02-14 DIAGNOSIS — Z125 Encounter for screening for malignant neoplasm of prostate: Secondary | ICD-10-CM

## 2018-02-14 LAB — LIPID PANEL
Cholesterol: 156 mg/dL (ref 0–200)
HDL: 40.6 mg/dL (ref >39.00)
LDL Cholesterol: 104 mg/dL — ABNORMAL HIGH (ref 0–99)
NONHDL: 115.12
Total CHOL/HDL Ratio: 4
Triglycerides: 57 mg/dL (ref 0.0–149.0)
VLDL: 11.4 mg/dL (ref 0.0–40.0)

## 2018-02-14 LAB — COMPREHENSIVE METABOLIC PANEL
ALBUMIN: 4.1 g/dL (ref 3.5–5.2)
ALT: 16 U/L (ref 0–53)
AST: 14 U/L (ref 0–37)
Alkaline Phosphatase: 43 U/L (ref 39–117)
BILIRUBIN TOTAL: 0.5 mg/dL (ref 0.2–1.2)
BUN: 15 mg/dL (ref 6–23)
CO2: 29 mEq/L (ref 19–32)
CREATININE: 0.96 mg/dL (ref 0.40–1.50)
Calcium: 9.1 mg/dL (ref 8.4–10.5)
Chloride: 107 mEq/L (ref 96–112)
GFR: 83.69 mL/min (ref >60.00)
Glucose, Bld: 113 mg/dL — ABNORMAL HIGH (ref 70–99)
Potassium: 4.4 mEq/L (ref 3.5–5.1)
SODIUM: 142 meq/L (ref 135–145)
TOTAL PROTEIN: 6.1 g/dL (ref 6.0–8.3)

## 2018-02-14 LAB — PSA: PSA: 1.7 ng/mL (ref 0.10–4.00)

## 2018-02-17 ENCOUNTER — Telehealth: Payer: Self-pay | Admitting: *Deleted

## 2018-02-17 ENCOUNTER — Encounter: Payer: Self-pay | Admitting: Family Medicine

## 2018-02-17 ENCOUNTER — Ambulatory Visit (INDEPENDENT_AMBULATORY_CARE_PROVIDER_SITE_OTHER): Payer: 59 | Admitting: Family Medicine

## 2018-02-17 VITALS — BP 126/84 | HR 54 | Temp 98.3°F | Ht 75.0 in | Wt 206.5 lb

## 2018-02-17 DIAGNOSIS — Z23 Encounter for immunization: Secondary | ICD-10-CM | POA: Diagnosis not present

## 2018-02-17 DIAGNOSIS — R739 Hyperglycemia, unspecified: Secondary | ICD-10-CM

## 2018-02-17 DIAGNOSIS — Z0000 Encounter for general adult medical examination without abnormal findings: Secondary | ICD-10-CM | POA: Diagnosis not present

## 2018-02-17 DIAGNOSIS — M545 Low back pain, unspecified: Secondary | ICD-10-CM

## 2018-02-17 DIAGNOSIS — Z8679 Personal history of other diseases of the circulatory system: Secondary | ICD-10-CM

## 2018-02-17 DIAGNOSIS — Z7189 Other specified counseling: Secondary | ICD-10-CM

## 2018-02-17 NOTE — Progress Notes (Signed)
CPE- See plan.  Routine anticipatory guidance given to patient.  See health maintenance.  The possibility exists that previously documented standard health maintenance information may have been brought forward from a previous encounter into this note.  If needed, that same information has been updated to reflect the current situation based on today's encounter.    Tetanus 2016 Shingles shot done 2015 PNA at 63.  Flu shot at clinic today Colonoscopy 2016 PSA screening d/w pt. He wanted screening. Risk and benefits of screening d/w pt. PSA wnl.  HCV and HIV screening prev done.  Living will d/w pt. Wife designated if patient were incapacitated.  Diet and exercise d/w pt. D/w pt about CT screening for lung CA screening. ~40 year hx smoking. He has stopped smoking. He should get a call about the CT and I'll await the notes/scan.   AAA screening not yet due, d/w pt.    Hypertension hx: Using medication without problems or lightheadedness:  See below Chest pain with exertion:no Edema:no Short of breath:no Average home BPs: 117-150/70s He was lightheaded on med prev.  Med stopped and felt better,  Feels fine now. Intentional weight loss noted and he likely isn't hypertensive now.    He had R knee pain after working on [employer].  He is using new knee pads and saw ortho.  He is doing well now.   Back pain is job dependent- if he is working a lot and lifting a lot, then he has more pain after the fact.  No ADE on prn nsaid.  D/w pt about cautions.    Mild hyperglycemia d/w pt.  Discussed options re: low carb foods.  Labs d/w pt.    PMH and SH reviewed  Meds, vitals, and allergies reviewed.   ROS: Per HPI.  Unless specifically indicated otherwise in HPI, the patient denies:  General: fever. Eyes: acute vision changes ENT: sore throat Cardiovascular: chest pain Respiratory: SOB GI: vomiting GU: dysuria Musculoskeletal: acute back pain Derm: acute rash Neuro: acute  motor dysfunction Psych: worsening mood Endocrine: polydipsia Heme: bleeding Allergy: hayfever  GEN: nad, alert and oriented HEENT: mucous membranes moist NECK: supple w/o LA CV: rrr. PULM: ctab, no inc wob ABD: soft, +bs EXT: no edema SKIN: no acute rash

## 2018-02-17 NOTE — Telephone Encounter (Signed)
Called patient to inform him of his appt for ldct screening 03-03-18 @ [facility] for 8:15am, voiced understanding.

## 2018-02-17 NOTE — Patient Instructions (Addendum)
Stay off BP med for now.  If BP is consistently >140/>90 at rest then let me know.  I would spot check your BP at home a few times a month.   Thanks for your effort.  Update me as needed.  Take care.  Glad to see you.

## 2018-02-17 NOTE — Telephone Encounter (Signed)
Patient had another question after his CPE when I went in to give flu vaccine.  Patient asks if his BMI is ok?

## 2018-02-18 NOTE — Telephone Encounter (Signed)
Left detailed message on voicemail.  

## 2018-02-18 NOTE — Telephone Encounter (Signed)
BMI 25.81 is fine.  Thanks.

## 2018-02-19 NOTE — Assessment & Plan Note (Signed)
  Tetanus 2016 Shingles shot done 2015 PNA at 63.  Flu shot at clinic today Colonoscopy 2016 PSA screening d/w pt. He wanted screening. Risk and benefits of screening d/w pt. PSA wnl.  HCV and HIV screening prev done.  Living will d/w pt. Wife designated if patient were incapacitated.  Diet and exercise d/w pt. D/w pt about CT screening for lung CA screening. ~40 year hx smoking. He has stopped smoking. He should get a call about the CT and I'll await the notes/scan.   AAA screening not yet due, d/w pt.

## 2018-02-19 NOTE — Assessment & Plan Note (Signed)
Variable, tends to be job dependent.  If he is working a lot that he has more pain after the fact.  Continue as needed NSAID use.  Routine cautions given.  Update me as needed.  He agrees.

## 2018-02-19 NOTE — Assessment & Plan Note (Signed)
Labs discussed with patient.  Discussed low-carb foods.  We can recheck periodically.

## 2018-02-19 NOTE — Assessment & Plan Note (Signed)
He was lightheaded on rx prev.  Med stopped and felt better,  Feels fine now. Intentional weight loss noted and he likely isn't hypertensive now.  Discussed with patient.  He can check his blood pressure episodically and update me as needed.  See after visit summary.

## 2018-02-19 NOTE — Assessment & Plan Note (Signed)
Living will d/w pt.  Wife designated if patient were incapacitated.   ?

## 2018-03-03 ENCOUNTER — Ambulatory Visit
Admission: RE | Admit: 2018-03-03 | Discharge: 2018-03-03 | Disposition: A | Discharge status: Home / Self Care | Payer: 59 | Source: Ambulatory Visit | Attending: Oncology | Admitting: Oncology

## 2018-03-03 ENCOUNTER — Encounter: Payer: Self-pay | Admitting: *Deleted

## 2018-03-03 DIAGNOSIS — Z87891 Personal history of nicotine dependence: Secondary | ICD-10-CM | POA: Diagnosis not present

## 2018-03-03 DIAGNOSIS — I2510 Atherosclerotic heart disease of native coronary artery without angina pectoris: Secondary | ICD-10-CM | POA: Diagnosis not present

## 2018-03-03 DIAGNOSIS — J439 Emphysema, unspecified: Secondary | ICD-10-CM | POA: Insufficient documentation

## 2018-03-03 DIAGNOSIS — I700 Atherosclerosis of aorta: Secondary | ICD-10-CM | POA: Diagnosis not present

## 2018-03-03 DIAGNOSIS — Z136 Encounter for screening for cardiovascular disorders: Secondary | ICD-10-CM | POA: Diagnosis not present

## 2018-03-03 DIAGNOSIS — Z122 Encounter for screening for malignant neoplasm of respiratory organs: Secondary | ICD-10-CM | POA: Diagnosis not present

## 2018-03-05 ENCOUNTER — Telehealth: Payer: Self-pay | Admitting: Family Medicine

## 2018-03-05 DIAGNOSIS — Z9189 Other specified personal risk factors, not elsewhere classified: Secondary | ICD-10-CM

## 2018-03-05 NOTE — Telephone Encounter (Signed)
Notify patient.  Lung CT report and images reviewed.  He does appear to have some mild calcifications in the coronary arteries.  We cannot quantify the exact extent of the calcification on this imaging.  The most important things to prevent progression would probably be continued smoking cessation and controlling his blood pressure.  He has already been working on both of those.  If he is going to do anything else it would be to consider starting a statin.  His cholesterol was not that bad and his previous smoking likely contributed more to the calcifications.  That said, if he wanted to be more aggressive in preventing the worsening of these calcifications it may be reasonable to consider trying a statin.  If he wants to proceed then let me know.  If he is having any chest pain otherwise then let me know.  Thanks.

## 2018-03-06 NOTE — Telephone Encounter (Signed)
Patient advised.   Patient would like to get the coronary CT scan to try to quantify the amount of narrowing.

## 2018-03-06 NOTE — Telephone Encounter (Signed)
I had looked back at the scans previously. They scans can't quantify the exact amount so it is difficult to judge the severity thereof.  These scans are designed to eval the lungs, not the small vessels in the heart.  He may only have mild narrowing.  I am hesitant to state that he doesn't have significant narrowing- he theoretically could have some other narrowing that was in a plane not imaged on the CT.    I see several options.   1. Since he does have calcification noted, consider statin start.  2. Observe off statin.  3. The last option is get a coronary CT done to try to quantify the amount of narrowing.  I thought about this after the initial call.  It would be an extra scan but we could get a better idea of the amount of calcium deposits.    Have him consider.  If he doesn't want to start a med (and I respect that), then option 2 or 3 would be reasonable.    Thanks.

## 2018-03-06 NOTE — Telephone Encounter (Signed)
Patient advised.  Patient asks if this CT and imaging was compared to previous reports?  Patient asks on a scale of 1 to 10, how bad is his calcifications?  Patient is not interested in starting a statin medication, especially when his cholesterol is not bad and states he would like to talk to Dr. Damita Dunnings about the situation.

## 2018-03-09 NOTE — Telephone Encounter (Signed)
I put in the order.  He may want to check with insurance about coverage so he isn't surprised by a bill after the fact. Thanks.

## 2018-03-09 NOTE — Addendum Note (Signed)
Addended by: Tonia Ghent on: 03/09/2018 02:43 PM   Modules accepted: Orders

## 2018-03-10 NOTE — Telephone Encounter (Signed)
Left detailed message on voicemail.  

## 2018-03-11 ENCOUNTER — Encounter: Payer: Self-pay | Admitting: Family Medicine

## 2018-03-25 ENCOUNTER — Ambulatory Visit: Payer: 59 | Admitting: Family Medicine

## 2018-03-25 ENCOUNTER — Encounter: Payer: Self-pay | Admitting: Family Medicine

## 2018-03-25 DIAGNOSIS — I2510 Atherosclerotic heart disease of native coronary artery without angina pectoris: Secondary | ICD-10-CM | POA: Diagnosis not present

## 2018-03-25 DIAGNOSIS — J0110 Acute frontal sinusitis, unspecified: Secondary | ICD-10-CM

## 2018-03-25 DIAGNOSIS — I2584 Coronary atherosclerosis due to calcified coronary lesion: Secondary | ICD-10-CM | POA: Diagnosis not present

## 2018-03-25 MED ORDER — AMOXICILLIN-POT CLAVULANATE 875-125 MG PO TABS: 1.0000 | ORAL_TABLET | Freq: Two times a day (BID) | ORAL | 0 refills | Status: DC

## 2018-03-25 NOTE — Progress Notes (Signed)
duration of symptoms: about 5 days, no relief with OTC meds.   rhinorrhea:yes congestion:yes ear pain: no sore throat: at night Cough: throat clearing, not from sputum in the chest except for some chest congestion in the AM.   Myalgias: no Fevers: no No vomiting or diarrhea.  Some frontal pain, less maxillary pain.  He feels a little better in the day, then worse again by nightfall.   He may be some better today, d/w pt.  His ENT hx is noted.  We talked about that with his previous episode that required multiple rounds of antibiotics.  We talked about the rationale of reserving quinolones for recalcitrant cases.  We talked extensively about his pending cardiology work-up.  He did have some coronary artery calcifications noted on the pulmonary CT scan.  We talked about the rationale for just proceeding with statin treatment as primary prevention of cardiac event versus proceeding with coronary CT scoring.  There are advantages to either option.  He wanted to get more information with the scan.  This is reasonable.  We talked about all this in detail.  Per HPI unless specifically indicated in ROS section  Meds, vitals, and allergies reviewed.   GEN: nad, alert and oriented HEENT: mucous membranes moist, TM w/o erythema, nasal epithelium injected, OP with cobblestoning NECK: supple w/o LA CV: rrr. PULM: ctab, no inc wob ABD: soft, +bs EXT: no edema Sinuses not ttp.

## 2018-03-25 NOTE — Patient Instructions (Addendum)
Hold augmentin for now.  Start if worse, if not better then let me know.  I'll await the cardiac testing.  Update me as needed otherwise.  Take care.  Glad to see you.

## 2018-03-26 DIAGNOSIS — I2584 Coronary atherosclerosis due to calcified coronary lesion: Principal | ICD-10-CM

## 2018-03-26 DIAGNOSIS — J0110 Acute frontal sinusitis, unspecified: Secondary | ICD-10-CM | POA: Insufficient documentation

## 2018-03-26 DIAGNOSIS — I2510 Atherosclerotic heart disease of native coronary artery without angina pectoris: Secondary | ICD-10-CM | POA: Insufficient documentation

## 2018-03-26 NOTE — Assessment & Plan Note (Signed)
We talked about that with his previous episode that required multiple rounds of antibiotics.  We talked about the rationale of reserving quinolones for recalcitrant cases.  He may be some better today.  Hold Augmentin for now.  Supportive treatment.  If worse in the meantime then start Augmentin.  If he fails treatment with that then let me know.  He agrees with plan.

## 2018-03-26 NOTE — Assessment & Plan Note (Signed)
We talked extensively about his pending cardiology work-up.  He did have some coronary artery calcifications noted on the pulmonary CT scan.  We talked about the rationale for just proceeding with statin treatment as primary prevention of cardiac event versus proceeding with coronary CT scoring.  There are advantages to either option.  He wanted to get more information with the scan.  This is reasonable.  We talked about all this in detail. >25 minutes spent in face to face time with patient, >50% spent in counselling or coordination of care.

## 2018-03-27 ENCOUNTER — Other Ambulatory Visit: Payer: 59

## 2018-03-31 ENCOUNTER — Ambulatory Visit (INDEPENDENT_AMBULATORY_CARE_PROVIDER_SITE_OTHER)
Admission: RE | Admit: 2018-03-31 | Discharge: 2018-03-31 | Disposition: A | Discharge status: Home / Self Care | Payer: Self-pay | Source: Ambulatory Visit | Attending: Family Medicine | Admitting: Family Medicine

## 2018-03-31 DIAGNOSIS — Z9189 Other specified personal risk factors, not elsewhere classified: Secondary | ICD-10-CM

## 2018-04-08 ENCOUNTER — Encounter

## 2018-04-08 ENCOUNTER — Encounter: Payer: Self-pay | Admitting: Family Medicine

## 2018-04-08 ENCOUNTER — Ambulatory Visit: Payer: 59 | Admitting: Family Medicine

## 2018-04-08 VITALS — BP 142/86 | HR 65 | Temp 98.4°F | Ht 75.0 in | Wt 210.5 lb

## 2018-04-08 DIAGNOSIS — I2510 Atherosclerotic heart disease of native coronary artery without angina pectoris: Secondary | ICD-10-CM | POA: Diagnosis not present

## 2018-04-08 DIAGNOSIS — J0110 Acute frontal sinusitis, unspecified: Secondary | ICD-10-CM | POA: Diagnosis not present

## 2018-04-08 DIAGNOSIS — I2584 Coronary atherosclerosis due to calcified coronary lesion: Secondary | ICD-10-CM

## 2018-04-08 MED ORDER — AMOXICILLIN-POT CLAVULANATE 875-125 MG PO TABS: 1.0000 | ORAL_TABLET | Freq: Two times a day (BID) | ORAL | 0 refills | Status: DC

## 2018-04-08 MED ORDER — ATORVASTATIN CALCIUM 20 MG PO TABS: 20.0000 mg | ORAL_TABLET | Freq: Every day | ORAL | 3 refills | Status: DC

## 2018-04-08 NOTE — Progress Notes (Signed)
We have previously talked extensively about his cardiac work-up with previous coronary artery calcifications noted on pulmonary CT scan.  I had previously offered statin start.  He wanted to get extra information with cardiac CT.  This was done.  He was called about the results promptly.  He did not have/does not have emergent symptoms but given his percentile rank and his total calcium score is reasonable to consider statin start.  I encouraged this especially since he has known coronary disease and no known contraindication to statin use.  We talked about this all again today at the office visit.  We talked about the potential issues with side effects for statins.  The most common side effect is muscle aches.  If he has that then he can stop the medication and let me know.  We can either adjust the dose or change statins to see if he can find 1 that he could tolerate.  Muscle aches are the main issue that I have seen in terms of statin intolerance.  Other issues with statin tolerance are much less common.  He does not have exertional chest pain otherwise.  He had occ chest discomfort that wasn't exertional and got better with TUMS.  D/w pt. we also talked about potential cardiology evaluation, he deferred this for now.  I would expect them not to proceed with catheterization in the absence of typical cardiac symptoms and I would expect them to offer him the same statin therapy.  We talked about follow-up labs.  He is better from URI sx but not resolved.  No fevers.  Given his history we discussed extending his Augmentin course.  Meds, vitals, and allergies reviewed.   ROS: Per HPI unless specifically indicated in ROS section   GEN: nad, alert and oriented HEENT: mucous membranes moist, nasal exam w/o erythema, scant clear discharge noted, OP with minimal cobblestoning NECK: supple w/o LA CV: rrr.   PULM: ctab, no inc wob EXT: no edema SKIN: no acute rash Sinuses not tender to palpation

## 2018-04-08 NOTE — Patient Instructions (Signed)
Finish the extra augmentin.  Start lipitor.  If you have side effects, then let me know.  If tolerated, recheck fasting labs in 2 months.  Let me now if you want to go to cardiology.  Take care.  Glad to see you.

## 2018-04-09 NOTE — Assessment & Plan Note (Signed)
He is some better but given his history with the incomplete resolution of symptoms I would extend his Augmentin by another 5 days.  Update me as needed.  He agrees.  Nontoxic okay for outpatient follow-up.

## 2018-04-09 NOTE — Assessment & Plan Note (Signed)
We already knew that he had coronary artery disease from the previous pulmonary CT with incidental finding of coronary calcification.  Follow-up cardiac CT confirms this.  Reasonable to start statin.  Long and detailed conversation as above.  See above.  Reasonable to try Lipitor 20 mg a day.  Goal LDL less than 70.  Recheck LFTs and lipids in about 2 months, assuming he can tolerate statin.  If he has any untoward side effects on the medication then I want him to let me know in the meantime.  It is reasonable to consider statin start and I encourage this.  I cannot guarantee any future events.  It may be that he has a relative intolerance to the medication.  It also may be that by taking a statin he decreases his risk of worsening coronary disease.  All questions answered to the best my ability. >25 minutes spent in face to face time with patient, >50% spent in counselling or coordination of care.

## 2018-06-09 ENCOUNTER — Other Ambulatory Visit (INDEPENDENT_AMBULATORY_CARE_PROVIDER_SITE_OTHER): Payer: BLUE CROSS/BLUE SHIELD

## 2018-06-09 DIAGNOSIS — I2584 Coronary atherosclerosis due to calcified coronary lesion: Secondary | ICD-10-CM | POA: Diagnosis not present

## 2018-06-09 DIAGNOSIS — I2510 Atherosclerotic heart disease of native coronary artery without angina pectoris: Secondary | ICD-10-CM

## 2018-06-09 LAB — HEPATIC FUNCTION PANEL
ALBUMIN: 4.2 g/dL (ref 3.5–5.2)
ALK PHOS: 51 U/L (ref 39–117)
ALT: 14 U/L (ref 0–53)
AST: 11 U/L (ref 0–37)
Bilirubin, Direct: 0.1 mg/dL (ref 0.0–0.3)
TOTAL PROTEIN: 6.6 g/dL (ref 6.0–8.3)
Total Bilirubin: 0.5 mg/dL (ref 0.2–1.2)

## 2018-06-09 LAB — LIPID PANEL
Cholesterol: 130 mg/dL (ref 0–200)
HDL: 38.1 mg/dL — AB (ref >39.00)
LDL Cholesterol: 77 mg/dL (ref 0–99)
NONHDL: 91.67
TRIGLYCERIDES: 75 mg/dL (ref 0.0–149.0)
Total CHOL/HDL Ratio: 3
VLDL: 15 mg/dL (ref 0.0–40.0)

## 2018-06-11 ENCOUNTER — Other Ambulatory Visit: Payer: Self-pay | Admitting: Family Medicine

## 2018-06-11 DIAGNOSIS — I2510 Atherosclerotic heart disease of native coronary artery without angina pectoris: Secondary | ICD-10-CM

## 2018-06-11 DIAGNOSIS — I2584 Coronary atherosclerosis due to calcified coronary lesion: Principal | ICD-10-CM

## 2018-06-11 MED ORDER — ATORVASTATIN CALCIUM 20 MG PO TABS: 30.0000 mg | ORAL_TABLET | Freq: Every day | ORAL | Status: DC

## 2018-06-17 ENCOUNTER — Other Ambulatory Visit: Payer: Self-pay | Admitting: Family Medicine

## 2018-06-17 MED ORDER — ATORVASTATIN CALCIUM 20 MG PO TABS
30.0000 mg | ORAL_TABLET | Freq: Every day | ORAL | 3 refills | Status: DC
Start: 2018-06-17 — End: 2019-03-02

## 2018-08-11 ENCOUNTER — Other Ambulatory Visit: Payer: Self-pay

## 2018-08-11 ENCOUNTER — Other Ambulatory Visit: Payer: BLUE CROSS/BLUE SHIELD

## 2018-08-11 ENCOUNTER — Other Ambulatory Visit (INDEPENDENT_AMBULATORY_CARE_PROVIDER_SITE_OTHER): Payer: Medicare Other

## 2018-08-11 DIAGNOSIS — I2584 Coronary atherosclerosis due to calcified coronary lesion: Secondary | ICD-10-CM

## 2018-08-11 DIAGNOSIS — I2510 Atherosclerotic heart disease of native coronary artery without angina pectoris: Secondary | ICD-10-CM | POA: Diagnosis not present

## 2018-08-11 LAB — HEPATIC FUNCTION PANEL
ALT: 16 U/L (ref 0–53)
AST: 14 U/L (ref 0–37)
Albumin: 4.1 g/dL (ref 3.5–5.2)
Alkaline Phosphatase: 57 U/L (ref 39–117)
Bilirubin, Direct: 0.1 mg/dL (ref 0.0–0.3)
Total Bilirubin: 0.5 mg/dL (ref 0.2–1.2)
Total Protein: 6 g/dL (ref 6.0–8.3)

## 2018-08-11 LAB — LIPID PANEL
Cholesterol: 110 mg/dL (ref 0–200)
HDL: 36 mg/dL — ABNORMAL LOW (ref >39.00)
LDL Cholesterol: 63 mg/dL (ref 0–99)
NonHDL: 73.54
Total CHOL/HDL Ratio: 3
Triglycerides: 55 mg/dL (ref 0.0–149.0)
VLDL: 11 mg/dL (ref 0.0–40.0)

## 2018-10-06 ENCOUNTER — Telehealth: Payer: Self-pay | Admitting: Family Medicine

## 2018-10-06 NOTE — Telephone Encounter (Signed)
Called the number provided.  The Atorvastatin needed a PA because of a quantity limit.  PA completed over the phone and a response will be received via Fax per the agent.

## 2018-10-06 NOTE — Telephone Encounter (Signed)
Patient stated that Stephen Hanna is requesting a call from our office to verify his medications. Since he is a new member on this plan they filled a 42 day supply but in order to continue to fill his medications they needed more information from our office. [phone]    Patient PHONE- [phone]

## 2019-02-23 ENCOUNTER — Other Ambulatory Visit: Payer: Self-pay | Admitting: Family Medicine

## 2019-02-23 ENCOUNTER — Other Ambulatory Visit (INDEPENDENT_AMBULATORY_CARE_PROVIDER_SITE_OTHER): Payer: Medicare Other

## 2019-02-23 DIAGNOSIS — Z125 Encounter for screening for malignant neoplasm of prostate: Secondary | ICD-10-CM | POA: Diagnosis not present

## 2019-02-23 DIAGNOSIS — I2584 Coronary atherosclerosis due to calcified coronary lesion: Secondary | ICD-10-CM

## 2019-02-23 DIAGNOSIS — I2510 Atherosclerotic heart disease of native coronary artery without angina pectoris: Secondary | ICD-10-CM

## 2019-02-23 LAB — LIPID PANEL
Cholesterol: 117 mg/dL (ref 0–200)
HDL: 37.3 mg/dL — ABNORMAL LOW (ref >39.00)
LDL Cholesterol: 67 mg/dL (ref 0–99)
NonHDL: 80.13
Total CHOL/HDL Ratio: 3
Triglycerides: 67 mg/dL (ref 0.0–149.0)
VLDL: 13.4 mg/dL (ref 0.0–40.0)

## 2019-02-23 LAB — COMPREHENSIVE METABOLIC PANEL
ALT: 12 U/L (ref 0–53)
AST: 11 U/L (ref 0–37)
Albumin: 4.1 g/dL (ref 3.5–5.2)
Alkaline Phosphatase: 63 U/L (ref 39–117)
BUN: 20 mg/dL (ref 6–23)
CO2: 27 mEq/L (ref 19–32)
Calcium: 8.8 mg/dL (ref 8.4–10.5)
Chloride: 107 mEq/L (ref 96–112)
Creatinine, Ser: 1.03 mg/dL (ref 0.40–1.50)
GFR: 72.37 mL/min (ref >60.00)
Glucose, Bld: 108 mg/dL — ABNORMAL HIGH (ref 70–99)
Potassium: 4.4 mEq/L (ref 3.5–5.1)
Sodium: 141 mEq/L (ref 135–145)
Total Bilirubin: 0.4 mg/dL (ref 0.2–1.2)
Total Protein: 6 g/dL (ref 6.0–8.3)

## 2019-02-23 LAB — PSA, MEDICARE: PSA: 1.69 ng/ml (ref 0.10–4.00)

## 2019-03-02 ENCOUNTER — Ambulatory Visit (INDEPENDENT_AMBULATORY_CARE_PROVIDER_SITE_OTHER): Payer: Medicare Other | Admitting: Family Medicine

## 2019-03-02 ENCOUNTER — Other Ambulatory Visit: Payer: Self-pay

## 2019-03-02 ENCOUNTER — Encounter: Payer: Self-pay | Admitting: Family Medicine

## 2019-03-02 VITALS — BP 140/82 | HR 60 | Temp 97.9°F | Ht 75.0 in | Wt 214.4 lb

## 2019-03-02 DIAGNOSIS — M545 Low back pain, unspecified: Secondary | ICD-10-CM

## 2019-03-02 DIAGNOSIS — R202 Paresthesia of skin: Secondary | ICD-10-CM | POA: Diagnosis not present

## 2019-03-02 DIAGNOSIS — I2584 Coronary atherosclerosis due to calcified coronary lesion: Secondary | ICD-10-CM

## 2019-03-02 DIAGNOSIS — Z23 Encounter for immunization: Secondary | ICD-10-CM

## 2019-03-02 DIAGNOSIS — Z0000 Encounter for general adult medical examination without abnormal findings: Secondary | ICD-10-CM

## 2019-03-02 DIAGNOSIS — I2510 Atherosclerotic heart disease of native coronary artery without angina pectoris: Secondary | ICD-10-CM

## 2019-03-02 DIAGNOSIS — Z7189 Other specified counseling: Secondary | ICD-10-CM

## 2019-03-02 MED ORDER — ATORVASTATIN CALCIUM 20 MG PO TABS: 20.0000 mg | ORAL_TABLET | Freq: Every day | ORAL | Status: DC

## 2019-03-02 NOTE — Progress Notes (Signed)
I have personally reviewed the Medicare Annual Wellness questionnaire and have noted 1. The patient's medical and social history 2. Their use of alcohol, tobacco or illicit drugs 3. Their current medications and supplements 4. The patient's functional ability including ADL's, fall risks, home safety risks and hearing or visual             impairment. 5. Diet and physical activities 6. Evidence for depression or mood disorders  The patients weight, height, BMI have been recorded in the chart and visual acuity is per eye clinic.  I have made referrals, counseling and provided education to the patient based review of the above and I have provided the pt with a written personalized care plan for preventive services.  Provider list updated- see scanned forms.  Routine anticipatory guidance given to patient.  See health maintenance. The possibility exists that previously documented standard health maintenance information may have been brought forward from a previous encounter into this note.  If needed, that same information has been updated to reflect the current situation based on today's encounter.    Flu 2012 Shingles discussed with patient PNA 2020 Tetanus 2016 Colon cancer screening 2016 Prostate cancer screening 2020 Advance directive-wife designated if patient were incapacitated. Cognitive function addressed- see scanned forms- and if abnormal then additional documentation follows.  AAA screening d/w pt.   See avs.   Prev lung cancer screening protocol d/w pt.  Due for f/u this fall.    Passed whisper hearing test.  See vision screening.  EKG on chart.  Elevated Cholesterol/CAD.  Using medications without problems:yes Muscle aches:  He had more aches at 30mg  lipitor but tolerates 20mg  a day, d/w pt.  Diet compliance: yes Exercise: walking at work.  Labs d/w pt.    D/w pt about back pain and nsaid use, he was taking meloxicam and aleve, see avs.  No ADE on meds prev.  He has some  aches after working, L leg but tolerable.     He has recurrent L facial tingling w/o absence of sensation in the AMs, early AM after getting up, resolved after 30 min.  Noted most mornings.  Going on for months.  No rash.  No weakness.  No other neuro sx in the face or ext.    PMH and SH reviewed  Meds, vitals, and allergies reviewed.   ROS: Per HPI.  Unless specifically indicated otherwise in HPI, the patient denies:  General: fever. Eyes: acute vision changes ENT: sore throat Cardiovascular: chest pain Respiratory: SOB GI: vomiting GU: dysuria Musculoskeletal: acute back pain Derm: acute rash Neuro: acute motor dysfunction Psych: worsening mood Endocrine: polydipsia Heme: bleeding Allergy: hayfever  GEN: nad, alert and oriented HEENT: ncat NECK: supple w/o LA CV: rrr. PULM: ctab, no inc wob ABD: soft, +bs EXT: no edema SKIN: no acute rash CN 2-12 wnl B, S/S/DTR wnl x4  Health Maintenance  Topic Date Due  . PNA vac Low Risk Adult (2 of 2 - PCV13) 03/01/2020  . TETANUS/TDAP  01/31/2025  . COLONOSCOPY  03/28/2025  . INFLUENZA VACCINE  Completed  . Hepatitis C Screening  Completed  . HIV Screening  Completed

## 2019-03-02 NOTE — Patient Instructions (Addendum)
Pick either meloxicam once a day or aleve up to twice a day with food.  Use whichever one works better.  Update me as needed.    Check to see if your insurance will cover AAA screening.  Let me know and we can set it up.   Call about an eye exam when possible.  If you need a referral, let me know.   Check with your insurance to see if they will cover the shingrix shot.  You should get a call about the lung cancer screening program.  If you don't get a call this fall, then let me know.    Take care.  Glad to see you.  When you need refills, call the pharmacy.

## 2019-03-04 ENCOUNTER — Telehealth: Payer: Self-pay | Admitting: Family Medicine

## 2019-03-04 DIAGNOSIS — Z136 Encounter for screening for cardiovascular disorders: Secondary | ICD-10-CM

## 2019-03-04 NOTE — Telephone Encounter (Signed)
Received a call from patient that the AAA screening will be covered by his insurance and he would like you to order it and he would like to go to [place]. He is calling his insurance about the Shingles shot and where he can go to have it done and he will get back to you.

## 2019-03-04 NOTE — Telephone Encounter (Signed)
Ordered. Thanks

## 2019-03-05 DIAGNOSIS — R202 Paresthesia of skin: Secondary | ICD-10-CM | POA: Insufficient documentation

## 2019-03-05 DIAGNOSIS — Z0000 Encounter for general adult medical examination without abnormal findings: Secondary | ICD-10-CM | POA: Insufficient documentation

## 2019-03-05 NOTE — Assessment & Plan Note (Signed)
He had more aches at 30mg  lipitor but tolerates 20mg  a day, d/w pt. continue at 20 mg.  No chest pain.  Okay for outpatient follow-up.

## 2019-03-05 NOTE — Assessment & Plan Note (Signed)
Only noted in the morning after he gets up.  It resolves soon thereafter.  It is always on the left side.  Never on the right side.  He has no facial weakness.  He has a normal exam today.  I want to consider this and will be in touch with the patient.  He agrees.

## 2019-03-05 NOTE — Assessment & Plan Note (Signed)
D/w pt about back pain and nsaid use, he was taking meloxicam and aleve, see avs.  No ADE on meds prev.  He has some aches after working, L leg but tolerable.

## 2019-03-05 NOTE — Telephone Encounter (Signed)
Pt said he checked with his ins co and pt can get shingrix at either the pharmacy or doctors office. Pt first wanted to know if he had a shingles vaccine in 2015 why does he need another shingles vaccine. I advised pt per manufacturer that the 2 part shingrix injections are 97 % efficacy and even though he had zostavax it is recommended by the providers to get the shingrix injection. Pt said that he will decide where he is going to get the shingles vaccine at. Nothing further needed.

## 2019-03-05 NOTE — Assessment & Plan Note (Signed)
Advance directive- wife designated if patient were incapacitated.  

## 2019-03-05 NOTE — Assessment & Plan Note (Signed)
Flu 2012 Shingles discussed with patient PNA 2020 Tetanus 2016 Colon cancer screening 2016 Prostate cancer screening 2020 Advance directive-wife designated if patient were incapacitated. Cognitive function addressed- see scanned forms- and if abnormal then additional documentation follows.  AAA screening d/w pt.   See avs.   Prev lung cancer screening protocol d/w pt.  Due for f/u this fall.

## 2019-03-09 ENCOUNTER — Telehealth: Payer: Self-pay | Admitting: *Deleted

## 2019-03-09 DIAGNOSIS — Z122 Encounter for screening for malignant neoplasm of respiratory organs: Secondary | ICD-10-CM

## 2019-03-09 DIAGNOSIS — Z87891 Personal history of nicotine dependence: Secondary | ICD-10-CM

## 2019-03-09 NOTE — Telephone Encounter (Signed)
Contacted patient about scheduling lung screening scan. Patient is going to contact his insurance company first and then call me back to schedule.

## 2019-03-09 NOTE — Telephone Encounter (Signed)
Patient has been notified that annual lung cancer screening low dose CT scan is due currently or will be in near future. Confirmed that patient is within the age range of 55-77, and asymptomatic, (no signs or symptoms of lung cancer). Patient denies illness that would prevent curative treatment for lung cancer if found. Verified smoking history, (former, quit 01/28/15, 30 pack year). The shared decision making visit was done 02/21/16. Patient is agreeable for CT scan being scheduled.

## 2019-03-12 ENCOUNTER — Other Ambulatory Visit: Payer: Self-pay

## 2019-03-12 ENCOUNTER — Ambulatory Visit
Admission: RE | Admit: 2019-03-12 | Discharge: 2019-03-12 | Disposition: A | Discharge status: Home / Self Care | Payer: Medicare Other | Source: Ambulatory Visit | Attending: Nurse Practitioner | Admitting: Nurse Practitioner

## 2019-03-12 DIAGNOSIS — Z87891 Personal history of nicotine dependence: Secondary | ICD-10-CM

## 2019-03-12 DIAGNOSIS — Z122 Encounter for screening for malignant neoplasm of respiratory organs: Secondary | ICD-10-CM | POA: Insufficient documentation

## 2019-03-15 ENCOUNTER — Telehealth: Payer: Self-pay | Admitting: Family Medicine

## 2019-03-15 DIAGNOSIS — R202 Paresthesia of skin: Secondary | ICD-10-CM

## 2019-03-15 NOTE — Telephone Encounter (Signed)
Please call patient.  He has been having some left-sided facial numbness only in the mornings.  It has been going on for months.  He told me about it at the last office visit.  I have been giving this some consideration in the meantime.  I still do not suspect any ominous process but I think it would make sense to talk to neurology, just to make sure nothing else needs to be done.  I can put in referral if needed.  Let me know.  Thanks.

## 2019-03-16 ENCOUNTER — Encounter: Payer: Self-pay | Admitting: *Deleted

## 2019-03-16 NOTE — Telephone Encounter (Signed)
Patient stated that he does want to be referred to a neurologist but wanted to see if we knew what type of test they would be doing so he can make sure it is covered by his insurance before being seen ?

## 2019-03-16 NOTE — Telephone Encounter (Signed)
Patient advised and states he would like to talk to his wife and get back with [place] if he wants the referral to Neurology.

## 2019-03-17 ENCOUNTER — Encounter: Payer: Self-pay | Admitting: Neurology

## 2019-03-17 NOTE — Telephone Encounter (Signed)
I want him to go talk to them first.  I do not know if they are going to order any imaging or not.  If they do consider that then he can check on the coverage then.  I put in the referral.  Thanks.

## 2019-03-17 NOTE — Addendum Note (Signed)
Addended by: Tonia Ghent on: 03/17/2019 07:05 AM   Modules accepted: Orders

## 2019-03-17 NOTE — Telephone Encounter (Signed)
Patient advised.

## 2019-04-09 ENCOUNTER — Ambulatory Visit (INDEPENDENT_AMBULATORY_CARE_PROVIDER_SITE_OTHER): Payer: Medicare Other

## 2019-04-09 ENCOUNTER — Other Ambulatory Visit: Payer: Self-pay

## 2019-04-09 DIAGNOSIS — Z136 Encounter for screening for cardiovascular disorders: Secondary | ICD-10-CM

## 2019-04-23 ENCOUNTER — Other Ambulatory Visit: Payer: Self-pay

## 2019-04-23 NOTE — Progress Notes (Signed)
[facility] Clinic Note - Initial Visit   Date: 04/24/19  Stephen Hanna MRN: [id number] DOB: [birth year]   Dear Dr. Damita Dunnings:  Thank you for your kind referral of Stephen Hanna for consultation of left facial paresthesias. Although his history is well known to you, please allow [place] to reiterate it for the purpose of our medical record. The patient was accompanied to the clinic by self.    History of Present Illness: Stephen Hanna is a 63 y.o. left-handed male with BPH, IBS, and hyperlipidemia, and prior tobacco use presenting for evaluation of left facial numbness.   Starting several months ago, he began noticing mild numbness/pressure over the left side of the cheek and forehead.  It occurs about 4-days of the week and tends only to occur when he wakes up in the morning.  Symptoms last 30-min and self-resolve.  He denies morning headaches, associated vision changes, facial weakness, difficulty swallowing/talking, or pain.  Nothing alleviates or triggers this.  No left arm or leg symptoms.   Out-side paper records, electronic medical record, and images have been reviewed where available and summarized as:  Lab Results  Component Value Date   TSH 1.85 01/04/2011   Lab Results  Component Value Date   ESRSEDRATE 10 01/04/2011    Past Medical History:  Diagnosis Date  . Back pain, chronic    after fall from ladder in 1980s with episodic flares  . BPH (benign prostatic hyperplasia)   . Diverticulitis 10/2010   divertics one small polyp (Dr. Fuller Plan)  . Hyperglycemia   . Hyperlipidemia   . Hypertension    hx of  . IBS (irritable bowel syndrome)    diarrhea predominant  . Personal history of kidney stones 02/07/2005   CT ABD/pelvis --Diverticulitis sig left renal stone // CT ABD w/o negative stones + multiple aortic L.N. 07/24/2004//    Past Surgical History:  Procedure Laterality Date  . CHOLECYSTECTOMY    . LAPAROSCOPIC CHOLECYSTECTOMY W/  CHOLANGIOGRAPHY  12/27/2008   Ct abd/pelvis w/o no gallstones mild dilation entire right ureter lap choley (Dr. Bary Castilla)  . VASECTOMY  1990     Medications:  Outpatient Encounter Medications as of 04/24/2019  Medication Sig  . atorvastatin (LIPITOR) 20 MG tablet Take 1 tablet (20 mg total) by mouth daily.  . naproxen sodium (ANAPROX) 220 MG tablet Take 220 mg by mouth daily.   . psyllium (METAMUCIL) 58.6 % powder Take 1 packet by mouth daily.    . vitamin B-12 (CYANOCOBALAMIN) 1000 MCG tablet Take 1,000 mcg by mouth daily.   No facility-administered encounter medications on file as of 04/24/2019.    Allergies:  Allergies  Allergen Reactions  . Atorvastatin     Aches at 47m but not 220ma day.   . Chantix [Varenicline]     Abnormal dreams  . Hydrocodone-Acetaminophen     REACTION: edgy, intolerant  . Lisinopril Other (See Comments)    cough    Family History: Family History  Problem Relation Age of Onset  . Heart disease Father [id number]     MI  . Kidney disease Father   . Depression Maternal Grandmother   . Alcohol abuse Neg Hx   . Stroke Neg Hx   . Prostate cancer Neg Hx   . Colon cancer Neg Hx     Social History: Social History   Tobacco Use  . Smoking status: Former Smoker    Packs/day: 1.00    Years: 30.00  Pack years: 30.00    Types: Cigarettes    Quit date: 01/28/2015    Years since quitting: 4.2  . Smokeless tobacco: Former Systems developer    Types: Chew  Substance Use Topics  . Alcohol use: Yes    Alcohol/week: 1.0 standard drinks    Types: 1 Standard drinks or equivalent per week    Comment: 1 drink a month  . 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   EYES: No visual changes or eye pain ENT: No  hearing changes.  No history of nose bleeds.   RESPIRATORY: No cough, wheezing and shortness of breath.   CARDIOVASCULAR: Negative for chest pain, and palpitations.   GI: Negative for abdominal discomfort, blood in stools or black stools.  No recent change in bowel habits.   GU:  No history of incontinence.   MUSCLOSKELETAL: No history of joint pain or swelling.  No myalgias.   SKIN: Negative for lesions, rash, and itching.   HEMATOLOGY/ONCOLOGY: Negative for prolonged bleeding, bruising easily, and swollen nodes.  No history of cancer.   ENDOCRINE: Negative for cold or heat intolerance, polydipsia or goiter.   PSYCH:  No depression or anxiety symptoms.   NEURO: As Above.   Vital Signs:  BP (!) 159/96   Pulse 61   Ht _0  (1.905 m)   Wt 217 lb (98.4 kg)   SpO2 99%   BMI 27.12 kg/m    General Medical Exam:   General:  Well appearing, comfortable.   Eyes/ENT: see cranial nerve examination.   Neck:   No carotid bruits. Respiratory:  Clear to auscultation, good air entry bilaterally.   Cardiac:  Regular rate and rhythm, no murmur.   Extremities:  No deformities, edema, or skin discoloration.  Skin:  No rashes or lesions.  Neurological Exam: MENTAL STATUS including orientation to time, place, person, recent and remote memory, attention span and concentration, language, and fund of knowledge is normal.  Speech is not dysarthric.  CRANIAL NERVES: II:  No visual field defects.   III-IV-VI: Pupils equal round and reactive to light.  Normal conjugate, extra-ocular eye movements in all directions of gaze.  No nystagmus.  No ptosis.   V:  Normal facial sensation.    VII:  Normal facial symmetry and movements are 5/5 VIII:  Normal hearing and vestibular function.   IX-X:  Normal palatal movement.   XI:  Normal shoulder shrug and head rotation.   XII:  Normal tongue is midline.  MOTOR:  No atrophy, fasciculations or abnormal movements.  No pronator drift.   Upper Extremity:  Right   Left  Deltoid  5/5   5/5   Biceps  5/5   5/5   Triceps  5/5   5/5   Infraspinatus 5/5  5/5  Medial pectoralis 5/5  5/5  Wrist extensors  5/5   5/5   Wrist flexors  5/5   5/5   Finger extensors  5/5   5/5   Finger flexors  5/5   5/5   Dorsal interossei  5/5   5/5   Abductor pollicis  5/5   5/5   Tone (Ashworth scale)  0  0  Lower Extremity:  Right  Left  Hip flexors  5/5   5/5   Hip extensors  5/5   5/5   Adductor 5/5  5/5  Abductor 5/5  5/5  Knee flexors  5/5   5/5   Knee extensors  5/5   5/5   Dorsiflexors  5/5   5/5   Plantarflexors  5/5   5/5   Toe extensors  5/5   5/5   Toe flexors  5/5   5/5   Tone (Ashworth scale)  0  0   MSRs:  Right        Left                  brachioradialis 2+  2+  biceps 2+  2+  triceps 2+  2+  patellar 2+  2+  ankle jerk 2+  2+  Hoffman no  no  plantar response down  down   SENSORY:  Normal and symmetric perception of light touch, pinprick, vibration, and proprioception.  Romberg's sign absent.   COORDINATION/GAIT: Normal finger-to- nose-finger and heel-to-shin.  Intact rapid alternating movements bilaterally.  Able to rise from a chair without using arms.  Gait narrow based and stable. Tandem and stressed gait intact.    IMPRESSION: Left facial numbness over the V1-2 dermatome, occurring about 4 times per week, lasting 30-min in the morning only. There are no neurological deficits on his exam, which is reassuring.  Discussed that we can screen labs for inflammatory, thyroid, and vitamin B12 deficiency.  I also offered MRI brain, but as symptoms are very mild, it was mutually decided not to proceed and consider only if symptoms get worse.   PLAN/RECOMMENDATIONS:  1.  Check ESR, CRP, TSH, vitamin B12 2.  Discussed MRI brain wwo contrast, if symptoms get worse or become constant  Further recommendations pending results.   Thank you for allowing me to participate in patient's care.  If I can answer any additional questions, I would be  pleased to do so.    Sincerely,    Floriene Jeschke K. Posey Pronto, DO

## 2019-04-24 ENCOUNTER — Other Ambulatory Visit (INDEPENDENT_AMBULATORY_CARE_PROVIDER_SITE_OTHER): Payer: Medicare Other

## 2019-04-24 ENCOUNTER — Ambulatory Visit: Payer: Medicare Other | Admitting: Neurology

## 2019-04-24 ENCOUNTER — Other Ambulatory Visit: Payer: Self-pay

## 2019-04-24 ENCOUNTER — Encounter: Payer: Self-pay | Admitting: Neurology

## 2019-04-24 VITALS — BP 159/96 | HR 61 | Ht 75.0 in | Wt 217.0 lb

## 2019-04-24 DIAGNOSIS — R200 Anesthesia of skin: Secondary | ICD-10-CM

## 2019-04-24 LAB — VITAMIN B12: Vitamin B-12: 808 pg/mL (ref 211–911)

## 2019-04-24 LAB — C-REACTIVE PROTEIN: CRP: <1 mg/dL (ref 0.5–20.0)

## 2019-04-24 LAB — T3, FREE: T3, Free: 3 pg/mL (ref 2.3–4.2)

## 2019-04-24 LAB — SEDIMENTATION RATE: Sed Rate: 5 mm/hr (ref 0–20)

## 2019-04-24 LAB — T4, FREE: Free T4: 0.68 ng/dL (ref 0.60–1.60)

## 2019-04-24 LAB — TSH: TSH: 4.94 u[IU]/mL — ABNORMAL HIGH (ref 0.35–4.50)

## 2019-04-24 NOTE — Patient Outreach (Signed)
[facility] [facility]) Care Management  04/24/2019  MANIK WESTGATE [birth year] [id number]   Medication Adherence call to Mr. Dirk Tessmann HIPPA Compliant Voice message left with a call back number. Mr. Mapstone is showing past due on Atorvastatin 20 mg under [place].  [facility] Direct Dial [phone]  Fax [phone] [email or web address]

## 2019-04-24 NOTE — Patient Instructions (Addendum)
Check labs Your provider has requested that you have labwork completed today. Please go to [facility] ([address]) on the second floor of this building before leaving the office today. You do not need to check in. If you are not called within 15 minutes please check with the front desk.    If your numbness gets worse, please contact my office and we can order additional imaging

## 2019-04-27 ENCOUNTER — Telehealth: Payer: Self-pay

## 2019-04-27 NOTE — Telephone Encounter (Signed)
Informed patient of lab results

## 2019-04-27 NOTE — Telephone Encounter (Signed)
-----   Message from Alda Berthold, DO sent at 04/27/2019  9:28 AM EST ----- Please notify patient lab are within normal limits.  Thank you.

## 2019-05-11 ENCOUNTER — Other Ambulatory Visit: Payer: Self-pay

## 2019-05-11 NOTE — Patient Outreach (Signed)
Stephen Hanna Stephen Hanna) Care Management  05/11/2019  Stephen Hanna [birth year] Stephen Hanna   Medication Adherence call to Stephen Hanna [facility] Identifiers Verify spoke with patient he is past due on Atorvastatin 20 mg,patient explain he is only taking 1 tablet daily,not 1& 1/2 tablet doctor has decreased to 1 tablet daily,patient has medication at this time and will order it when due. Stephen Hanna is showing past due under Stephen Hanna.  [facility] Management Direct Dial [phone]  Fax [phone] [email or web address]

## 2019-06-26 ENCOUNTER — Ambulatory Visit (INDEPENDENT_AMBULATORY_CARE_PROVIDER_SITE_OTHER): Payer: Medicare Other | Admitting: Family Medicine

## 2019-06-26 ENCOUNTER — Other Ambulatory Visit: Payer: Self-pay

## 2019-06-26 ENCOUNTER — Telehealth: Payer: Self-pay

## 2019-06-26 ENCOUNTER — Encounter: Payer: Self-pay | Admitting: Family Medicine

## 2019-06-26 DIAGNOSIS — J069 Acute upper respiratory infection, unspecified: Secondary | ICD-10-CM | POA: Diagnosis not present

## 2019-06-26 MED ORDER — AMOXICILLIN-POT CLAVULANATE 875-125 MG PO TABS: 1.0000 | ORAL_TABLET | Freq: Two times a day (BID) | ORAL | 0 refills | Status: DC

## 2019-06-26 NOTE — Telephone Encounter (Signed)
noted 

## 2019-06-26 NOTE — Telephone Encounter (Signed)
If he has recent URI sx then phone visit makes sense.  Thanks.

## 2019-06-26 NOTE — Progress Notes (Signed)
Interactive audio and video telecommunications were attempted between this provider and patient, however failed, due to patient having technical difficulties OR patient did not have access to video capability.  We continued and completed visit with audio only.   Virtual Visit via Telephone Note  I connected with patient on 06/26/19  at 2:17 PM  by telephone and verified that I am speaking with the correct person using two identifiers.  Location of patient: home  Location of MD: [employer] Name of referring provider (if blank then none associated): Names per persons and role in encounter:  MD: Earlyne Iba, Patient: name listed above.    I discussed the limitations, risks, security and privacy concerns of performing an evaluation and management service by telephone and the availability of in person appointments. I also discussed with the patient that there may be a patient responsible charge related to this service. The patient expressed understanding and agreed to proceed.  CC: URI sx  History of Present Illness:  He tested positive for covid prev (about 1 month ago, had fatigue and body aches), with f/u testing neg thereafter.  He got better in the meantime.   Sx started a few days ago.  Sneezing and cough with some sputum.  Sinus pressure.  Taking OTC cough medicine.  No fevers.  ST.  Some rhinorrhea.  No SOB, not CP.      Observations/Objective: No apparent distress.  Speech normal.  Assessment and Plan:  URI.  Discussed options.  At this point, hold augmentin, start if not better soon, supportive care in the meantime.   Still okay for outpatient f/u.  He agrees to plan.  Follow Up Instructions: See above.  Update me as needed.   I discussed the assessment and treatment plan with the patient. The patient was provided an opportunity to ask questions and all were answered. The patient agreed with the plan and demonstrated an understanding of the instructions.   The patient  was advised to call back or seek an in-person evaluation if the symptoms worsen or if the condition fails to improve as anticipated.  I provided 15 minutes of non-face-to-face time during this encounter.  Elsie Stain, MD

## 2019-06-26 NOTE — Telephone Encounter (Signed)
Patient called stating that he is having sinus issues/problems, post nasal drip, sore throat, started yesterday. No fever. Patient did not want to come in for an office visit and did not have ability to do virtual visit. Set patient up for phone visit. Patient stated Dr Damita Dunnings called medication in for him before. Explained to patient that we needed to set up an appointment to discuss symptoms and proper treatment.  He was tested at [facility] about a month ago for COVID and he was re tested about 1 1/2 weeks ago there again and it was negative.   I did tell patient that I would send Dr Damita Dunnings a message about this and if Dr Damita Dunnings wanted to proceed differently with this we would call him back otherwise he is set up for phone visit today at 2 pm.

## 2019-06-28 NOTE — Assessment & Plan Note (Signed)
Discussed options.  At this point, hold augmentin, start if not better soon, supportive care in the meantime.   Still okay for outpatient f/u.  He agrees to plan.

## 2019-07-02 ENCOUNTER — Other Ambulatory Visit: Payer: Self-pay | Admitting: Family Medicine

## 2019-08-08 ENCOUNTER — Other Ambulatory Visit: Payer: Self-pay

## 2019-08-08 ENCOUNTER — Emergency Department
Admission: EM | Admit: 2019-08-08 | Discharge: 2019-08-08 | Disposition: A | Discharge status: Home / Self Care | Payer: Medicare Other | Attending: Emergency Medicine | Admitting: Emergency Medicine

## 2019-08-08 ENCOUNTER — Encounter: Payer: Self-pay | Admitting: Emergency Medicine

## 2019-08-08 ENCOUNTER — Emergency Department: Payer: Medicare Other

## 2019-08-08 DIAGNOSIS — K449 Diaphragmatic hernia without obstruction or gangrene: Secondary | ICD-10-CM | POA: Insufficient documentation

## 2019-08-08 DIAGNOSIS — Z79899 Other long term (current) drug therapy: Secondary | ICD-10-CM | POA: Insufficient documentation

## 2019-08-08 DIAGNOSIS — N200 Calculus of kidney: Secondary | ICD-10-CM | POA: Diagnosis not present

## 2019-08-08 DIAGNOSIS — Z791 Long term (current) use of non-steroidal anti-inflammatories (NSAID): Secondary | ICD-10-CM | POA: Insufficient documentation

## 2019-08-08 DIAGNOSIS — Z87891 Personal history of nicotine dependence: Secondary | ICD-10-CM | POA: Diagnosis not present

## 2019-08-08 DIAGNOSIS — R109 Unspecified abdominal pain: Secondary | ICD-10-CM | POA: Diagnosis not present

## 2019-08-08 LAB — COMPREHENSIVE METABOLIC PANEL
ALT: 17 U/L (ref 0–44)
AST: 17 U/L (ref 15–41)
Albumin: 4.3 g/dL (ref 3.5–5.0)
Alkaline Phosphatase: 61 U/L (ref 38–126)
Anion gap: 7 (ref 5–15)
BUN: 22 mg/dL (ref 8–23)
CO2: 25 mmol/L (ref 22–32)
Calcium: 9.5 mg/dL (ref 8.9–10.3)
Chloride: 109 mmol/L (ref 98–111)
Creatinine, Ser: 1.1 mg/dL (ref 0.61–1.24)
GFR calc Af Amer: >60 mL/min (ref >60)
GFR calc non Af Amer: >60 mL/min (ref >60)
Glucose, Bld: 104 mg/dL — ABNORMAL HIGH (ref 70–99)
Potassium: 4.4 mmol/L (ref 3.5–5.1)
Sodium: 141 mmol/L (ref 135–145)
Total Bilirubin: 0.7 mg/dL (ref 0.3–1.2)
Total Protein: 7.1 g/dL (ref 6.5–8.1)

## 2019-08-08 LAB — URINALYSIS, COMPLETE (UACMP) WITH MICROSCOPIC
Bacteria, UA: NONE SEEN
Bilirubin Urine: NEGATIVE
Glucose, UA: NEGATIVE mg/dL
Ketones, ur: NEGATIVE mg/dL
Leukocytes,Ua: NEGATIVE
Nitrite: NEGATIVE
Protein, ur: NEGATIVE mg/dL
RBC / HPF: >50 RBC/hpf — ABNORMAL HIGH (ref 0–5)
Specific Gravity, Urine: 1.024 (ref 1.005–1.030)
Squamous Epithelial / HPF: NONE SEEN (ref 0–5)
pH: 5 (ref 5.0–8.0)

## 2019-08-08 LAB — CBC
HCT: 43.3 % (ref 39.0–52.0)
Hemoglobin: 14.7 g/dL (ref 13.0–17.0)
MCH: 29.1 pg (ref 26.0–34.0)
MCHC: 33.9 g/dL (ref 30.0–36.0)
MCV: 85.7 fL (ref 80.0–100.0)
Platelets: 263 10*3/uL (ref 150–400)
RBC: 5.05 MIL/uL (ref 4.22–5.81)
RDW: 14.2 % (ref 11.5–15.5)
WBC: 8.4 10*3/uL (ref 4.0–10.5)
nRBC: 0 % (ref 0.0–0.2)

## 2019-08-08 LAB — LIPASE, BLOOD: Lipase: 39 U/L (ref 11–51)

## 2019-08-08 MED ORDER — HYDROMORPHONE HCL 1 MG/ML IJ SOLN
1.0000 mg | Freq: Once | INTRAMUSCULAR | Status: AC
  Administered 2019-08-08: 16:00:00 1 mg via INTRAVENOUS
  Filled 2019-08-08: qty 1

## 2019-08-08 MED ORDER — OXYCODONE-ACETAMINOPHEN 5-325 MG PO TABS: 1.0000 | ORAL_TABLET | Freq: Four times a day (QID) | ORAL | 0 refills | Status: DC | PRN

## 2019-08-08 MED ORDER — TAMSULOSIN HCL 0.4 MG PO CAPS: 0.4000 mg | ORAL_CAPSULE | Freq: Every day | ORAL | 0 refills | Status: AC

## 2019-08-08 MED ORDER — TAMSULOSIN HCL 0.4 MG PO CAPS
0.4000 mg | ORAL_CAPSULE | Freq: Once | ORAL | Status: AC
  Administered 2019-08-08: 17:00:00 0.4 mg via ORAL
  Filled 2019-08-08: qty 1

## 2019-08-08 MED ORDER — SODIUM CHLORIDE 0.9 % IV BOLUS
1000.0000 mL | Freq: Once | INTRAVENOUS | Status: AC
  Administered 2019-08-08: 1000 mL via INTRAVENOUS

## 2019-08-08 MED ORDER — ONDANSETRON 4 MG PO TBDP: 4.0000 mg | ORAL_TABLET | Freq: Three times a day (TID) | ORAL | 0 refills | Status: DC | PRN

## 2019-08-08 MED ORDER — ONDANSETRON HCL 4 MG/2ML IJ SOLN
4.0000 mg | Freq: Once | INTRAMUSCULAR | Status: AC
  Administered 2019-08-08: 16:00:00 4 mg via INTRAVENOUS
  Filled 2019-08-08: qty 2

## 2019-08-08 MED ORDER — KETOROLAC TROMETHAMINE 30 MG/ML IJ SOLN
15.0000 mg | Freq: Once | INTRAMUSCULAR | Status: AC
  Administered 2019-08-08: 15 mg via INTRAVENOUS
  Filled 2019-08-08: qty 1

## 2019-08-08 NOTE — Discharge Instructions (Addendum)
For your kidney stone, drink at least 6 to 8 glasses of water.  Take the prescribed medications for pain and nausea.  If you continue to have pain after the next 48 hours, call Dr. Ralene Muskrat office to set up an appointment.  You can also take over-the-counter ibuprofen for pain.  For your hernia, as we discussed, call Dr. Corlis Leak office to set up a follow-up. Find the impression on your CT below:  IMPRESSION: 1. Moderate right hydroureteronephrosis with 2 adjacent stones at the right ureterovesicular junction. Proximal stone measures 3.3 mm, with adjacent larger stone 10 x 7 mm. 2. Additional nonobstructing stones in both kidneys. 3. Colonic diverticulosis without diverticulitis. 4. Right right anterior diaphragmatic hernia with herniation of omental fat and hepatic flexure of the colon, unchanged from prior lung cancer screening chest CT November 2020. No evidence of obstruction or inflammation. Consider elective surgical evaluation. 5. Enlarged prostate gland causing mass effect on the bladder base.

## 2019-08-08 NOTE — ED Triage Notes (Signed)
Sudden onset of abd pain while mowing the grass. Low rt abd pain.

## 2019-08-08 NOTE — ED Provider Notes (Signed)
[facility] Emergency Department Provider Note  ____________________________________________   First MD Initiated Contact with Patient 08/08/19 1546     (approximate)  I have reviewed the triage vital signs and the nursing notes.   HISTORY  Chief Complaint Abdominal Pain    HPI Stephen Hanna is a 64 y.o. male here with right-sided flank pain.  The patient states he was in his usual state of health.  He was doing the yard at work today.  He states that he experienced acute onset of fairly severe right lower quadrant and right flank pain.  The pain has since intermittently radiated up towards his right flank.  Associated nausea.  Said associated hematuria and urinary frequency.  No dysuria.  No fevers or chills.  Has history of kidney stones with similar complaints.  No other recent medication changes or health changes.      History reviewed. No pertinent past medical history.  There are no problems to display for this patient.   History reviewed. No pertinent surgical history.  Prior to Admission medications   Medication Sig Start Date End Date Taking? Authorizing Provider  atorvastatin (LIPITOR) 20 MG tablet Take 20 mg by mouth daily.   Yes [provider]  naproxen (NAPROSYN) 250 MG tablet Take 250 mg by mouth daily.   Yes [provider]  psyllium (METAMUCIL) 58.6 % packet Take 1 packet by mouth daily.   Yes [provider]  vitamin B-12 (CYANOCOBALAMIN) 1000 MCG tablet Take 1,000 mcg by mouth daily.   Yes [provider]  ondansetron (ZOFRAN ODT) 4 MG disintegrating tablet Take 1 tablet (4 mg total) by mouth every 8 (eight) hours as needed for nausea or vomiting. 08/08/19   Duffy Bruce, MD  oxyCODONE-acetaminophen (PERCOCET) 5-325 MG tablet Take 1-2 tablets by mouth every 6 (six) hours as needed for moderate pain or severe pain. 08/08/19 08/07/20  Duffy Bruce, MD  tamsulosin (FLOMAX) 0.4 MG CAPS capsule Take 1  capsule (0.4 mg total) by mouth daily after supper for 7 days. 08/08/19 08/15/19  Duffy Bruce, MD    Allergies Patient has no known allergies.  History reviewed. No pertinent family history.  Social History Social History   Tobacco Use  . Smoking status: Former Research scientist (life sciences)  . Smokeless tobacco: Never Used  . Tobacco comment: quit 6 years ago  Substance Use Topics  . Alcohol use: Not on file  . Drug use: Not on file    Review of Systems  Review of Systems  Constitutional: Positive for fatigue. Negative for chills and fever.  HENT: Negative for sore throat.   Respiratory: Negative for shortness of breath.   Cardiovascular: Negative for chest pain.  Gastrointestinal: Positive for nausea. Negative for abdominal pain.  Genitourinary: Positive for dysuria, flank pain, frequency and hematuria.  Musculoskeletal: Negative for neck pain.  Skin: Negative for rash and wound.  Allergic/Immunologic: Negative for immunocompromised state.  Neurological: Negative for weakness and numbness.  Hematological: Does not bruise/bleed easily.  All other systems reviewed and are negative.    ____________________________________________  PHYSICAL EXAM:      VITAL SIGNS: ED Triage Vitals  Enc Vitals Group     BP 08/08/19 1413 (!) 158/94     Pulse Rate 08/08/19 1413 72     Resp 08/08/19 1413 20     Temp 08/08/19 1413 97.7 F (36.5 C)     Temp Source 08/08/19 1413 Oral     SpO2 08/08/19 1413 97 %  Weight 08/08/19 1414 215 lb (97.5 kg)     Height 08/08/19 1414 6\' 3"  (1.905 m)     Head Circumference --      Peak Flow --      Pain Score --      Pain Loc --      Pain Edu? --      Excl. in [place]? --      Physical Exam Vitals and nursing note reviewed.  Constitutional:      General: He is not in acute distress.    Appearance: He is well-developed.  HENT:     Head: Normocephalic and atraumatic.  Eyes:     Conjunctiva/sclera: Conjunctivae normal.  Cardiovascular:     Rate and Rhythm:  Normal rate and regular rhythm.     Heart sounds: Normal heart sounds.  Pulmonary:     Effort: Pulmonary effort is normal. No respiratory distress.     Breath sounds: No wheezing.  Abdominal:     General: There is no distension.     Tenderness: There is abdominal tenderness in the right lower quadrant. There is no guarding or rebound.  Musculoskeletal:     Cervical back: Neck supple.  Skin:    General: Skin is warm.     Capillary Refill: Capillary refill takes less than 2 seconds.     Findings: No rash.  Neurological:     Mental Status: He is alert and oriented to person, place, and time.     Motor: No abnormal muscle tone.       ____________________________________________   LABS (all labs ordered are listed, but only abnormal results are displayed)  Labs Reviewed  COMPREHENSIVE METABOLIC PANEL - Abnormal; Notable for the following components:      Result Value   Glucose, Bld 104 (*)    All other components within normal limits  URINALYSIS, COMPLETE (UACMP) WITH MICROSCOPIC - Abnormal; Notable for the following components:   Color, Urine YELLOW (*)    APPearance CLEAR (*)    Hgb urine dipstick MODERATE (*)    RBC / HPF >50 (*)    All other components within normal limits  LIPASE, BLOOD  CBC    ____________________________________________  EKG: Nne ________________________________________  RADIOLOGY All imaging, including plain films, CT scans, and ultrasounds, independently reviewed by me, and interpretations confirmed via formal radiology reads.  ED MD interpretation:   CT Stone: R hydro with 3.3 and 10x7 mm stone at right UVJ, large R anterior diaphragmatic hernia  Official radiology report(s): CT Renal Stone Study  Result Date: 08/08/2019 CLINICAL DATA:  Sudden onset of right flank pain while mowing the grass. History of kidney stones EXAM: CT ABDOMEN AND PELVIS WITHOUT CONTRAST TECHNIQUE: Multidetector CT imaging of the abdomen and pelvis was performed  following the standard protocol without IV contrast. COMPARISON:  Remote abdominal CT 11/26/2008 FINDINGS: Lower chest: Anterior diaphragmatic hernia just to the right of midline with herniation of omental fat and hepatic flexure of the colon, unchanged from chest CT 03/12/2019. Hernia defect best appreciated on coronal and sagittal reformats, series 5, image 25 and series 6, image 83. Mild adjacent atelectasis or scarring in the right lung base. Hepatobiliary: Prominent liver spanning 20 cm cranial caudal. No evidence of focal lesion on noncontrast exam. Clips in the gallbladder fossa postcholecystectomy. No biliary dilatation. Pancreas: No ductal dilatation or inflammation. Spleen: Normal in size without focal abnormality. Adrenals/Urinary Tract: Bilateral adrenal thickening without dominant nodule. Moderate right hydroureteronephrosis. There are 2 adjacent stones at the  right ureterovesicular junction with more proximal stone measuring 3 x 3 mm and adjacent stone measuring 10 x 7 mm. There are 3 nonobstructing stones in the right kidney largest in the upper pole measuring 6 mm. Mild right perinephric edema. Two punctate nonobstructing stones in the left kidney. No left hydronephrosis. The left ureter is decompressed. Small low-density in the upper left kidney is incompletely characterized on noncontrast exam but may represent focal scarring when compared with prior. Urinary bladder is minimally distended. Bladder wall thickening may be related to known extension. Stomach/Bowel: Stomach is unremarkable. Normal positioning of the ligament of Treitz. No small bowel obstruction or inflammation. Appendix is normal. Hepatic flexure colon courses through an anterior diaphragmatic defect into the lower thorax. No obstruction or inflammatory change. Moderate diverticulosis of the distal descending and sigmoid colon. No acute diverticulitis. Vascular/Lymphatic: Mild aortic atherosclerosis. No aortic aneurysm. No bulky  abdominopelvic lymph nodes. Reproductive: Enlarged prostate gland spanning 5.6 cm transverse causing mass effect on the bladder base. Other: Fat within both inguinal canals. Tiny fat containing umbilical hernia. No free air free fluid. Anterior diaphragmatic hernia as described above. Musculoskeletal: There are no acute or suspicious osseous abnormalities. Degenerative change in the lumbar spine is most prominent at L5-S1. There is a bone island within posterior elements of left L4. IMPRESSION: 1. Moderate right hydroureteronephrosis with 2 adjacent stones at the right ureterovesicular junction. Proximal stone measures 3.3 mm, with adjacent larger stone 10 x 7 mm. 2. Additional nonobstructing stones in both kidneys. 3. Colonic diverticulosis without diverticulitis. 4. Right right anterior diaphragmatic hernia with herniation of omental fat and hepatic flexure of the colon, unchanged from prior lung cancer screening chest CT November 2020. No evidence of obstruction or inflammation. Consider elective surgical evaluation. 5. Enlarged prostate gland causing mass effect on the bladder base. Aortic Atherosclerosis (ICD10-I70.0). Electronically Signed   By: Keith Rake M.D.   On: 08/08/2019 16:13    ____________________________________________  PROCEDURES   Procedure(s) performed (including Critical Care):  Procedures  ____________________________________________  INITIAL IMPRESSION / MDM / [place] / ED COURSE  As part of my medical decision making, I reviewed the following data within the [place] notes reviewed and incorporated, Old chart reviewed, Notes from prior ED visits, and [place] Controlled Substance Database       *Treveion Recio was evaluated in Emergency Department on 08/08/2019 for the symptoms described in the history of present illness. He was evaluated in the context of the global COVID-19 pandemic, which necessitated consideration that the patient  might be at risk for infection with the SARS-CoV-2 virus that causes COVID-19. Institutional protocols and algorithms that pertain to the evaluation of patients at risk for COVID-19 are in a state of rapid change based on information released by regulatory bodies including the CDC and federal and state organizations. These policies and algorithms were followed during the patient's care in the ED.  Some ED evaluations and interventions may be delayed as a result of limited staffing during the pandemic.*     Medical Decision Making:  64 yo M here with right flank pain 2/2 two right UVJ sotnes, 3.3 mm and 10 x 7 mm. These stones are causing moderate hydro but pain resolved after fluids, analgesia here and suspect he can be managed as an outpt for now. No leukocytosis, fever, or signs of sepsis or infected stone. Renal function is normal. Return precautions given.  Incidentally, pt has large R diaphragmatic hernia. This was present previously and  likely is NOT causing his current sx, but given the degree of hernia on CT I discussed with Dr. Peyton Najjar, who graciously also notified Dr. Dahlia Byes. Will refer for close outpt follow-up.  Discussed renal stone treatment and precautions as well as diaphragmatic hernia with husband and wife. D/c home.  ____________________________________________  FINAL CLINICAL IMPRESSION(S) / ED DIAGNOSES  Final diagnoses:  Renal stone  Diaphragmatic hernia without obstruction and without gangrene     MEDICATIONS GIVEN DURING THIS VISIT:  Medications  HYDROmorphone (DILAUDID) injection 1 mg (1 mg Intravenous Given 08/08/19 1626)  ondansetron (ZOFRAN) injection 4 mg (4 mg Intravenous Given 08/08/19 1626)  sodium chloride 0.9 % bolus 1,000 mL (0 mLs Intravenous Stopped 08/08/19 1730)  ketorolac (TORADOL) 30 MG/ML injection 15 mg (15 mg Intravenous Given 08/08/19 1654)  tamsulosin (FLOMAX) capsule 0.4 mg (0.4 mg Oral Given 08/08/19 1654)     ED Discharge Orders         Ordered     oxyCODONE-acetaminophen (PERCOCET) 5-325 MG tablet  Every 6 hours PRN     08/08/19 1738    ondansetron (ZOFRAN ODT) 4 MG disintegrating tablet  Every 8 hours PRN     08/08/19 1738    tamsulosin (FLOMAX) 0.4 MG CAPS capsule  Daily after supper     08/08/19 1738           Note:  This document was prepared using Dragon voice recognition software and may include unintentional dictation errors.   Duffy Bruce, MD 08/08/19 1755

## 2019-08-10 ENCOUNTER — Ambulatory Visit (INDEPENDENT_AMBULATORY_CARE_PROVIDER_SITE_OTHER): Payer: Medicare Other | Admitting: Family Medicine

## 2019-08-10 ENCOUNTER — Other Ambulatory Visit: Payer: Self-pay

## 2019-08-10 ENCOUNTER — Encounter: Payer: Self-pay | Admitting: Family Medicine

## 2019-08-10 DIAGNOSIS — K449 Diaphragmatic hernia without obstruction or gangrene: Secondary | ICD-10-CM

## 2019-08-10 DIAGNOSIS — N400 Enlarged prostate without lower urinary tract symptoms: Secondary | ICD-10-CM

## 2019-08-10 DIAGNOSIS — N200 Calculus of kidney: Secondary | ICD-10-CM | POA: Diagnosis not present

## 2019-08-10 MED ORDER — OXYCODONE-ACETAMINOPHEN 5-325 MG PO TABS: 1.0000 | ORAL_TABLET | Freq: Four times a day (QID) | ORAL | Status: DC | PRN

## 2019-08-10 MED ORDER — ONDANSETRON 4 MG PO TBDP: 4.0000 mg | ORAL_TABLET | Freq: Three times a day (TID) | ORAL | Status: DC | PRN

## 2019-08-10 MED ORDER — TAMSULOSIN HCL 0.4 MG PO CAPS: 0.4000 mg | ORAL_CAPSULE | Freq: Every day | ORAL | Status: DC

## 2019-08-10 NOTE — Progress Notes (Signed)
This visit occurred during the SARS-CoV-2 public health emergency.  Safety protocols were in place, including screening questions prior to the visit, additional usage of staff PPE, and extensive cleaning of exam room while observing appropriate contact time as indicated for disinfecting solutions.  He has duplicate chart, MR# [id number].  They have been flagged for merger.    To ER with flank pain.  He had mult findings, noted below.  D/w pt.    IMPRESSION: 1. Moderate right hydroureteronephrosis with 2 adjacent stones at the right ureterovesicular junction. Proximal stone measures 3.3 mm, with adjacent larger stone 10 x 7 mm. 2. Additional nonobstructing stones in both kidneys. 3. Colonic diverticulosis without diverticulitis. 4. Right right anterior diaphragmatic hernia with herniation of omental fat and hepatic flexure of the colon, unchanged from prior lung cancer screening chest CT November 2020. No evidence of obstruction or inflammation. Consider elective surgical evaluation. 5. Enlarged prostate gland causing mass effect on the bladder base.  He is straining his urine, with some sediment seen.  No clearly passed stones.  He is using his pain medicine sparingly/prn, no ADE on med.   Prev PSA d/w pt, wnl.  He has enlarged prostate seen on CT.  He doesn't have nocturia or sig LUTS .  His imaging at this point re: his prostate or DRE wouldn't change his plan/tx, d/w pt.  He agrees.   RE: hernia of diaphragm. He isn't having CP and can still exert himself.  He has surgery f/u pending.  I wasn't able to pull up in the imaging at the time of the exam, d/w pt.  See plan.    ROS: Per HPI unless specifically indicated in ROS section   Meds, vitals, and allergies reviewed.   GEN: nad, alert and oriented HEENT: ncat NECK: supple w/o LA CV: rrr. PULM: ctab, no inc wob ABD: soft, +bs EXT: no edema SKIN: well perfused.

## 2019-08-10 NOTE — Patient Instructions (Addendum)
I will check on getting your records merged.  Reasonable to get a covid shot after this episode is resolved (3 months from prior infection).  I will check with radiology and await the notes from the surgery clinic.  Try cutting the oxycodone in half if needed.  If you catch a stone, then let me know.  Update me as needed.  Take care.  Glad to see you.

## 2019-08-12 ENCOUNTER — Other Ambulatory Visit: Payer: Self-pay

## 2019-08-12 ENCOUNTER — Encounter: Payer: Self-pay | Admitting: Surgery

## 2019-08-12 ENCOUNTER — Ambulatory Visit: Payer: Medicare Other | Admitting: Surgery

## 2019-08-12 ENCOUNTER — Telehealth: Payer: Self-pay | Admitting: Emergency Medicine

## 2019-08-12 VITALS — BP 134/86 | HR 69 | Temp 97.7°F | Resp 12 | Ht 75.0 in | Wt 214.8 lb

## 2019-08-12 DIAGNOSIS — K449 Diaphragmatic hernia without obstruction or gangrene: Secondary | ICD-10-CM

## 2019-08-12 DIAGNOSIS — N400 Enlarged prostate without lower urinary tract symptoms: Secondary | ICD-10-CM | POA: Insufficient documentation

## 2019-08-12 NOTE — Assessment & Plan Note (Signed)
I talked with radiology after the visit and called patient back.   He likely had developing hernia that was not obvious prev but now with current CT it is more easily seen on prev imaging, in retrospect.  He has surgery f/u pending and I'll await input.  At this point, still asymptomatic.  At least 30 minutes were devoted to patient care in this encounter (this can potentially include time spent reviewing the patient's file/history, interviewing and examining the patient, counseling/reviewing plan with patient, ordering referrals, ordering tests, reviewing relevant laboratory or x-ray data, and documenting the encounter).

## 2019-08-12 NOTE — Telephone Encounter (Signed)
Medical Clearance sent to Dr Elsie Stain [phone]) at this time.

## 2019-08-12 NOTE — Patient Instructions (Addendum)
Our surgery scheduler will contact you to schedule your surgery. Please have the BLUE SHEET available when she calls you.   Call the office if you have any questions or concerns.  Hiatal Hernia  A hiatal hernia occurs when part of the stomach slides above the muscle that separates the abdomen from the chest (diaphragm). A person can be born with a hiatal hernia (congenital), or it may develop over time. In almost all cases of hiatal hernia, only the top part of the stomach pushes through the diaphragm. Many people have a hiatal hernia with no symptoms. The larger the hernia, the more likely it is that you will have symptoms. In some cases, a hiatal hernia allows stomach acid to flow back into the tube that carries food from your mouth to your stomach (esophagus). This may cause heartburn symptoms. Severe heartburn symptoms may mean that you have developed a condition called gastroesophageal reflux disease (GERD). What are the causes? This condition is caused by a weakness in the opening (hiatus) where the esophagus passes through the diaphragm to attach to the upper part of the stomach. A person may be born with a weakness in the hiatus, or a weakness can develop over time. What increases the risk? This condition is more likely to develop in:  Older people. Age is a major risk factor for a hiatal hernia, especially if you are over the age of 37.  Pregnant women.  People who are overweight.  People who have frequent constipation. What are the signs or symptoms? Symptoms of this condition usually develop in the form of GERD symptoms. Symptoms include:  Heartburn.  Belching.  Indigestion.  Trouble swallowing.  Coughing or wheezing.  Sore throat.  Hoarseness.  Chest pain.  Nausea and vomiting. How is this diagnosed? This condition may be diagnosed during testing for GERD. Tests that may be done include:  X-rays of your stomach or chest.  An upper gastrointestinal (GI) series.  This is an X-ray exam of your GI tract that is taken after you swallow a chalky liquid that shows up clearly on the X-ray.  Endoscopy. This is a procedure to look into your stomach using a thin, flexible tube that has a tiny camera and light on the end of it. How is this treated? This condition may be treated by:  Dietary and lifestyle changes to help reduce GERD symptoms.  Medicines. These may include: ? Over-the-counter antacids. ? Medicines that make your stomach empty more quickly. ? Medicines that block the production of stomach acid (H2 blockers). ? Stronger medicines to reduce stomach acid (proton pump inhibitors).  Surgery to repair the hernia, if other treatments are not helping. If you have no symptoms, you may not need treatment. Follow these instructions at home: Lifestyle and activity  Do not use any products that contain nicotine or tobacco, such as cigarettes and e-cigarettes. If you need help quitting, ask your health care provider.  Try to achieve and maintain a healthy body weight.  Avoid putting pressure on your abdomen. Anything that puts pressure on your abdomen increases the amount of acid that may be pushed up into your esophagus. ? Avoid bending over, especially after eating. ? Raise the head of your bed by putting blocks under the legs. This keeps your head and esophagus higher than your stomach. ? Do not wear tight clothing around your chest or stomach. ? Try not to strain when having a bowel movement, when urinating, or when lifting heavy objects. Eating and drinking  Avoid foods that can worsen GERD symptoms. These may include: ? Fatty foods, like fried foods. ? Citrus fruits, like oranges or lemon. ? Other foods and drinks that contain acid, like orange juice or tomatoes. ? Spicy food. ? Chocolate.  Eat frequent small meals instead of three large meals a day. This helps prevent your stomach from getting too full. ? Eat slowly. ? Do not lie down  right after eating. ? Do not eat 1-2 hours before bed.  Do not drink beverages with caffeine. These include cola, coffee, cocoa, and tea.  Do not drink alcohol. General instructions  Take over-the-counter and prescription medicines only as told by your health care provider.  Keep all follow-up visits as told by your health care provider. This is important. Contact a health care provider if:  Your symptoms are not controlled with medicines or lifestyle changes.  You are having trouble swallowing.  You have coughing or wheezing that will not go away. Get help right away if:  Your pain is getting worse.  Your pain spreads to your arms, neck, jaw, teeth, or back.  You have shortness of breath.  You sweat for no reason.  You feel sick to your stomach (nauseous) or you vomit.  You vomit blood.  You have bright red blood in your stools.  You have black, tarry stools. This information is not intended to replace advice given to you by your health care provider. Make sure you discuss any questions you have with your health care provider. Document Revised: 04/05/2017 Document Reviewed: 11/26/2016 Elsevier Patient Education  [place].

## 2019-08-12 NOTE — Assessment & Plan Note (Signed)
Okay for outpatient f/u.  He'll use pain meds prn.  I can refill rx if needed in short run or if needed to hold for recurrent sx in the future.  He'll update me as needed. Continue to strain urine, he'll update me if he gets a sample.  Continue PO fluids.

## 2019-08-12 NOTE — Assessment & Plan Note (Signed)
Prev PSA d/w pt, wnl.  He has enlarged prostate seen on CT.  He doesn't have nocturia or sig LUTS .  His imaging at this point re: his prostate or DRE wouldn't change his plan/tx, d/w pt.  He agrees.  Continue as is.

## 2019-08-14 ENCOUNTER — Encounter: Payer: Self-pay | Admitting: Surgery

## 2019-08-14 NOTE — Progress Notes (Signed)
Surgical Consultation  08/14/2019  Stephen Hanna is an 64 y.o. male.   Chief Complaint  Patient presents with  . New Patient (Initial Visit)    Diaphragmatic hernia     HPI: 63 year old male seen after he was evaluated in the emergency room last week for abdominal pain.  He reports that sometimes he does have some indigestion and right-sided chest and right upper quadrant pain that is mild to moderate intensity colicky type.  No specific alleviating or aggravating factors.  Last week his pain was more to the right flank.  No fevers no chills. His CT scan I have personally reviewed showing evidence of our right diaphragmatic hernia with evidence of large intestine within the sac.  It is a significant defect.  No evidence of incarceration or strangulation.  Laboratory values included a normal CMP and CBC.  He is able to perform more than 4 METS of activity without any shortness of breath or chest pain.  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 He did have a history of laparoscopic cholecystectomy several years ago and did well.   Past Medical History:  Diagnosis Date  . Back pain, chronic    after fall from ladder in 1980s with episodic flares  . BPH (benign prostatic hyperplasia)   . Diverticulitis 10/2010   divertics one small polyp (Dr. Fuller Plan)  . Hyperglycemia   . Hyperlipidemia   . Hypertension    hx of  . IBS (irritable bowel syndrome)    diarrhea predominant  . Personal history of kidney stones 02/07/2005   CT ABD/pelvis --Diverticulitis sig left renal stone // CT ABD w/o negative stones + multiple aortic L.N. 07/24/2004//    Past Surgical History:  Procedure Laterality Date  . CHOLECYSTECTOMY    . LAPAROSCOPIC CHOLECYSTECTOMY W/ CHOLANGIOGRAPHY  12/27/2008   Ct abd/pelvis w/o no gallstones mild dilation entire right ureter lap choley (Dr. Bary Castilla)  . VASECTOMY  1990    Family History  Problem Relation Age of Onset  . Heart disease Father  1       MI  . Kidney disease Father   . Depression Maternal Grandmother   . Alcohol abuse Neg Hx   . Stroke Neg Hx   . Prostate cancer Neg Hx   . Colon cancer Neg Hx     Social History:  reports that he quit smoking about 4 years ago. His smoking use included cigarettes. He has a 30.00 pack-year smoking history. He has never used smokeless tobacco. He reports current alcohol use of about 1.0 standard drinks of alcohol per week. He reports that he does not use drugs.  Allergies:  Allergies  Allergen Reactions  . Atorvastatin     Aches at 30mg  but not 20mg  a day.   . Chantix [Varenicline]     Abnormal dreams  . Hydrocodone-Acetaminophen     REACTION: edgy, intolerant  . Lisinopril Other (See Comments)    cough    Medications reviewed.     ROS Full ROS performed and is otherwise negative other than what is stated in the HPI    BP 134/86   Pulse 69   Temp 97.7 F (36.5 C)   Resp 12   Ht 6\' 3"  (1.905 m)   Wt 214 lb 12.8 oz (97.4 kg)   SpO2 97%   BMI 26.85 kg/m   Physical Exam Vitals and nursing note reviewed. Exam conducted with a chaperone present.  Constitutional:  General: He is not in acute distress.    Appearance: Normal appearance.  Eyes:     General:        Right eye: No discharge.        Left eye: No discharge.     Conjunctiva/sclera: Conjunctivae normal.  Cardiovascular:     Rate and Rhythm: Normal rate and regular rhythm.  Pulmonary:     Effort: Pulmonary effort is normal. No respiratory distress.     Breath sounds: Normal breath sounds. No stridor. No wheezing or rhonchi.  Chest:     Chest wall: No tenderness.  Abdominal:     General: Abdomen is flat. There is no distension.     Palpations: There is no mass.     Tenderness: There is no abdominal tenderness. There is no guarding or rebound.     Hernia: No hernia is present.  Musculoskeletal:        General: Normal range of motion.     Cervical back: Normal range of motion and neck  supple. No rigidity or tenderness.  Skin:    General: Skin is warm and dry.     Capillary Refill: Capillary refill takes less than 2 seconds.  Neurological:     General: No focal deficit present.     Mental Status: He is alert and oriented to person, place, and time.  Psychiatric:        Mood and Affect: Mood normal.        Behavior: Behavior normal.        Thought Content: Thought content normal.        Judgment: Judgment normal.       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  Discussed with the patient in detail about his disease process.  He is concerned that he may get complications related to his diaphragmatic hernia and wishes to have something definitive lead done about it.  I do think that he will be a good candidate for robotic approach.  The procedure was discussed with the patient in detail.  Risk benefits and possible applications including but not limited to: Infection, bleeding, pneumothorax, vascular injuries and recurrences.  Also discussed with him that depending on the size intraoperatively we will decide if he may need some mesh reinforcement or not.  Extensive counseling provided. Note that I spent about 60 minutes in this encounter with greater 50% spent coordination counseling of his care  Caroleen Hamman, MD [facility]

## 2019-08-14 NOTE — H&P (View-Only) (Signed)
Surgical Consultation  08/14/2019  Stephen Hanna is an 64 y.o. male.   Chief Complaint  Patient presents with  . New Patient (Initial Visit)    Diaphragmatic hernia     HPI: 64 year old male seen after he was evaluated in the emergency room last week for abdominal pain.  He reports that sometimes he does have some indigestion and right-sided chest and right upper quadrant pain that is mild to moderate intensity colicky type.  No specific alleviating or aggravating factors.  Last week his pain was more to the right flank.  No fevers no chills. His CT scan I have personally reviewed showing evidence of our right diaphragmatic hernia with evidence of large intestine within the sac.  It is a significant defect.  No evidence of incarceration or strangulation.  Laboratory values included a normal CMP and CBC.  He is able to perform more than 4 METS of activity without any shortness of breath or chest pain.  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 He did have a history of laparoscopic cholecystectomy several years ago and did well.   Past Medical History:  Diagnosis Date  . Back pain, chronic    after fall from ladder in 1980s with episodic flares  . BPH (benign prostatic hyperplasia)   . Diverticulitis 10/2010   divertics one small polyp (Dr. Fuller Plan)  . Hyperglycemia   . Hyperlipidemia   . Hypertension    hx of  . IBS (irritable bowel syndrome)    diarrhea predominant  . Personal history of kidney stones 02/07/2005   CT ABD/pelvis --Diverticulitis sig left renal stone // CT ABD w/o negative stones + multiple aortic L.N. 07/24/2004//    Past Surgical History:  Procedure Laterality Date  . CHOLECYSTECTOMY    . LAPAROSCOPIC CHOLECYSTECTOMY W/ CHOLANGIOGRAPHY  12/27/2008   Ct abd/pelvis w/o no gallstones mild dilation entire right ureter lap choley (Dr. Bary Castilla)  . VASECTOMY  1990    Family History  Problem Relation Age of Onset  . Heart disease Father  69       MI  . Kidney disease Father   . Depression Maternal Grandmother   . Alcohol abuse Neg Hx   . Stroke Neg Hx   . Prostate cancer Neg Hx   . Colon cancer Neg Hx     Social History:  reports that he quit smoking about 4 years ago. His smoking use included cigarettes. He has a 30.00 pack-year smoking history. He has never used smokeless tobacco. He reports current alcohol use of about 1.0 standard drinks of alcohol per week. He reports that he does not use drugs.  Allergies:  Allergies  Allergen Reactions  . Atorvastatin     Aches at 30mg  but not 20mg  a day.   . Chantix [Varenicline]     Abnormal dreams  . Hydrocodone-Acetaminophen     REACTION: edgy, intolerant  . Lisinopril Other (See Comments)    cough    Medications reviewed.     ROS Full ROS performed and is otherwise negative other than what is stated in the HPI    BP 134/86   Pulse 69   Temp 97.7 F (36.5 C)   Resp 12   Ht 6\' 3"  (1.905 m)   Wt 214 lb 12.8 oz (97.4 kg)   SpO2 97%   BMI 26.85 kg/m   Physical Exam Vitals and nursing note reviewed. Exam conducted with a chaperone present.  Constitutional:  General: He is not in acute distress.    Appearance: Normal appearance.  Eyes:     General:        Right eye: No discharge.        Left eye: No discharge.     Conjunctiva/sclera: Conjunctivae normal.  Cardiovascular:     Rate and Rhythm: Normal rate and regular rhythm.  Pulmonary:     Effort: Pulmonary effort is normal. No respiratory distress.     Breath sounds: Normal breath sounds. No stridor. No wheezing or rhonchi.  Chest:     Chest wall: No tenderness.  Abdominal:     General: Abdomen is flat. There is no distension.     Palpations: There is no mass.     Tenderness: There is no abdominal tenderness. There is no guarding or rebound.     Hernia: No hernia is present.  Musculoskeletal:        General: Normal range of motion.     Cervical back: Normal range of motion and neck  supple. No rigidity or tenderness.  Skin:    General: Skin is warm and dry.     Capillary Refill: Capillary refill takes less than 2 seconds.  Neurological:     General: No focal deficit present.     Mental Status: He is alert and oriented to person, place, and time.  Psychiatric:        Mood and Affect: Mood normal.        Behavior: Behavior normal.        Thought Content: Thought content normal.        Judgment: Judgment normal.       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  Discussed with the patient in detail about his disease process.  He is concerned that he may get complications related to his diaphragmatic hernia and wishes to have something definitive lead done about it.  I do think that he will be a good candidate for robotic approach.  The procedure was discussed with the patient in detail.  Risk benefits and possible applications including but not limited to: Infection, bleeding, pneumothorax, vascular injuries and recurrences.  Also discussed with him that depending on the size intraoperatively we will decide if he may need some mesh reinforcement or not.  Extensive counseling provided. Note that I spent about 60 minutes in this encounter with greater 50% spent coordination counseling of his care  Caroleen Hamman, MD [facility]

## 2019-08-17 ENCOUNTER — Telehealth: Payer: Self-pay | Admitting: Surgery

## 2019-08-17 NOTE — Telephone Encounter (Signed)
Pt has been advised of Pre-Admission date/time, COVID Testing date and Surgery date.  Surgery Date: 09/03/19 Preadmission Testing Date: 08/28/19 (phone 1p-5p) Covid Testing Date: 09/01/19 - patient advised to go to the [facility] between 8a-1p   Patient has been made aware to call [phone], between 1-3:00pm the day before surgery, to find out what time to arrive for surgery.

## 2019-08-18 ENCOUNTER — Telehealth: Payer: Self-pay | Admitting: Emergency Medicine

## 2019-08-18 NOTE — Telephone Encounter (Signed)
Medical clearance received by Dr Damita Dunnings. Pt is optimized for surgery at low risk.

## 2019-08-24 ENCOUNTER — Telehealth: Payer: Self-pay | Admitting: Family Medicine

## 2019-08-24 ENCOUNTER — Telehealth: Payer: Self-pay | Admitting: Surgery

## 2019-08-24 NOTE — Telephone Encounter (Signed)
Called and got patient scheduled for CPE and labs. 

## 2019-08-24 NOTE — Telephone Encounter (Signed)
Outgoing call made.  Patient will check with his employer to see if he would be able to get the time off for the possibility of moving surgery to this Thursday April 22nd, 2021.  He will call back

## 2019-08-24 NOTE — Telephone Encounter (Signed)
Patient returns call.  Unfortunately he will not be able to schedule surgery for a sooner date due to work schedule and he also states that his father-in-law in the hospital. Just too much going on at this time but appreciates the offer for sooner.

## 2019-08-25 ENCOUNTER — Ambulatory Visit (INDEPENDENT_AMBULATORY_CARE_PROVIDER_SITE_OTHER): Payer: Medicare Other

## 2019-08-25 VITALS — Ht 75.0 in | Wt 218.6 lb

## 2019-08-25 DIAGNOSIS — Z0000 Encounter for general adult medical examination without abnormal findings: Secondary | ICD-10-CM

## 2019-08-25 NOTE — Progress Notes (Signed)
Subjective:   Stephen Hanna is a 64 y.o. male who presents for an Initial Medicare Annual Wellness Visit.  Review of Systems: N/A   This visit is being conducted through telemedicine via telephone at the nurse health advisor's home address due to the COVID-19 pandemic. This patient has given me verbal consent via doximity to conduct this visit, patient states they are participating from their home address. Patient and myself are on the telephone call. There is no referral for this visit. Some vital signs may be absent or patient reported.    Patient identification: identified by name, DOB, and current address   Cardiac Risk Factors include: advanced age (>81men, >76 women);male gender;dyslipidemia    Objective:    Today's Vitals   08/25/19 0856 08/25/19 0900  Weight: 218 lb 9 oz (99.1 kg)   Height: 6\' 3"  (1.905 m)   PainSc:  0-No pain   Body mass index is 27.32 kg/m.  Advanced Directives 08/25/2019 08/08/2019 04/24/2019 03/29/2015 03/15/2015  Does Patient Have a Medical Advance Directive? Yes No Yes Yes Yes  Type of Paramedic of [place];Living will - [place];Living will - [place];Living will  Does patient want to make changes to medical advance directive? - - - - No - Patient declined  Copy of [place] in Chart? No - copy requested - - No - copy requested No - copy requested  Would patient like information on creating a medical advance directive? - No - Patient declined - - -    Current Medications (verified) Outpatient Encounter Medications as of 08/25/2019  Medication Sig  . atorvastatin (LIPITOR) 20 MG tablet Take 20 mg by mouth daily.  . carboxymethylcellulose (LUBRICANT EYE DROPS) 0.5 % SOLN Place 1 drop into both eyes 3 (three) times daily as needed (dry/irritated eyes.).  Marland Kitchen naproxen sodium (ANAPROX) 220 MG tablet Take 220 mg by mouth daily.   . ondansetron (ZOFRAN ODT) 4 MG  disintegrating tablet Take 1 tablet (4 mg total) by mouth every 8 (eight) hours as needed for nausea or vomiting.  Marland Kitchen oxyCODONE-acetaminophen (PERCOCET) 5-325 MG tablet Take 1-2 tablets by mouth every 6 (six) hours as needed for moderate pain or severe pain. (Patient taking differently: Take 0.5-1 tablets by mouth every 6 (six) hours as needed for moderate pain or severe pain. )  . psyllium (METAMUCIL) 58.6 % packet Take 1 packet by mouth daily.  . vitamin B-12 (CYANOCOBALAMIN) 1000 MCG tablet Take 1,000 mcg by mouth daily.   No facility-administered encounter medications on file as of 08/25/2019.    Allergies (verified) Atorvastatin, Chantix [varenicline], Hydrocodone-acetaminophen, and Lisinopril   History: Past Medical History:  Diagnosis Date  . Back pain, chronic    after fall from ladder in 1980s with episodic flares  . BPH (benign prostatic hyperplasia)   . Diverticulitis 10/2010   divertics one small polyp (Dr. Fuller Plan)  . Hyperglycemia   . Hyperlipidemia   . Hypertension    hx of  . IBS (irritable bowel syndrome)    diarrhea predominant  . Personal history of kidney stones 02/07/2005   CT ABD/pelvis --Diverticulitis sig left renal stone // CT ABD w/o negative stones + multiple aortic L.N. 07/24/2004//   Past Surgical History:  Procedure Laterality Date  . CHOLECYSTECTOMY    . LAPAROSCOPIC CHOLECYSTECTOMY W/ CHOLANGIOGRAPHY  12/27/2008   Ct abd/pelvis w/o no gallstones mild dilation entire right ureter lap choley (Dr. Bary Castilla)  . [place]  Family History  Problem Relation Age of Onset  . Heart disease Father 72       MI  . Kidney disease Father   . Depression Maternal Grandmother   . Alcohol abuse Neg Hx   . Stroke Neg Hx   . Prostate cancer Neg Hx   . Colon cancer Neg Hx    Social History   Socioeconomic History  . Marital status: Married    Spouse name: Not on file  . Number of children: Not on file  . Years of education: Not on file  . Highest  education level: Not on file  Occupational History  . Not on file  Tobacco Use  . Smoking status: Former Smoker    Packs/day: 1.00    Years: 30.00    Pack years: 30.00    Types: Cigarettes    Quit date: 01/28/2015    Years since quitting: 4.5  . Smokeless tobacco: Never Used  . Tobacco comment: quit 6 years ago  Substance and Sexual Activity  . Alcohol use: Yes    Alcohol/week: 1.0 standard drinks    Types: 1 Standard drinks or equivalent per week    Comment: 1 drink a month  . Drug use: No  . Sexual activity: Yes  Other Topics Concern  . 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 Difficulty of Paying Living Expenses: Not hard at all  Food Insecurity: No Food Insecurity  . Worried About Charity fundraiser in the Last Year: Never true  . Ran Out of Food in the Last Year: Never true  Transportation Needs: No Transportation Needs  . Lack of Transportation (Medical): No  . Lack of Transportation (Non-Medical): No  Physical Activity: Inactive  . Days of Exercise per Week: 0 days  . Minutes of Exercise per Session: 0 min  Stress: No Stress Concern Present  . Feeling of Stress : Not at all  Social Connections:   . Frequency of Communication with Friends and Family:   . Frequency of Social Gatherings with Friends and Family:   . Attends Religious Services:   . Active Member of Clubs or Organizations:   . Attends Archivist Meetings:   Marland Kitchen Marital Status:    Tobacco Counseling Counseling given: Not Answered Comment: quit 6 years ago   Clinical Intake:  Pre-visit preparation completed: Yes  Pain : No/denies pain Pain Score: 0-No pain     Nutritional Risks: None Diabetes: No  How often do you need to have  someone help you when you read instructions, pamphlets, or other written materials from your doctor or pharmacy?: 1 - Never What is the last grade level you completed in school?: 12th  Interpreter Needed?: No  Information entered by :: CJohnson, LPN  Activities of Daily Living In your present state of health, do you have any difficulty performing the following activities: 08/25/2019  Hearing? N  Vision? N  Difficulty concentrating or making decisions? N  Walking or climbing stairs? N  Dressing or bathing? N  Doing errands, shopping? N  Preparing Food and eating ? N  Using the Toilet? N  In the past six months, have you accidently leaked urine? N  Do  you have problems with loss of bowel control? N  Managing your Medications? N  Managing your Finances? N  Housekeeping or managing your Housekeeping? N  Some recent data might be hidden     Immunizations and Health Maintenance Immunization History  Administered Date(s) Administered  . Fluad Quad(high Dose 65+) 03/02/2019  . Influenza Split 04/11/2011  . Influenza Whole 04/17/2007, 02/10/2008  . Influenza,inj,Quad PF,6+ Mos 01/26/2013, 01/28/2014, 02/01/2015, 02/11/2017, 02/17/2018  . Pneumococcal Polysaccharide-23 03/02/2019  . Td 05/07/2004  . Tdap 02/01/2015  . Zoster 01/28/2014   Health Maintenance Due  Topic Date Due  . COVID-19 Vaccine (1) Never done    Patient Care Team: Tonia Ghent, MD as PCP - General (Family Medicine) Alda Berthold, DO as Consulting Physician (Neurology) Tonia Ghent, MD (Family Medicine)  Indicate any recent Medical Services you may have received from other than [facility] providers in the past year (date may be approximate).    Assessment:   This is a routine wellness examination for Aidenjames.  Hearing/Vision screen  Hearing Screening   125Hz  250Hz  500Hz  1000Hz  2000Hz  3000Hz  4000Hz  6000Hz  8000Hz   Right ear:           Left ear:           Vision Screening Comments: Advised patient to get  annual eye exams.  Dietary issues and exercise activities discussed: Current Exercise Habits: The patient has a physically strenuous job, but has no regular exercise apart from work., Exercise limited by: None identified  Goals    . Patient Stated     08/25/2019, I will maintain and continue medications as prescribed.       Depression Screen PHQ 2/9 Scores 08/25/2019 03/02/2019 02/17/2018 02/11/2017  PHQ - 2 Score 0 0 0 0  PHQ- 9 Score 0 - - -    Fall Risk Fall Risk  08/25/2019 08/12/2019 04/24/2019 03/02/2019 02/17/2018  Falls in the past year? 0 0 0 0 No  Number falls in past yr: 0 - - - -  Injury with Fall? 0 - - - -  Risk for fall due to : Medication side effect - - - -  Follow up Falls prevention discussed;Falls evaluation completed - - - -    Is the patient's home free of loose throw rugs in walkways, pet beds, electrical cords, etc?   yes      Grab bars in the bathroom? no      Handrails on the stairs?   no      Adequate lighting?   yes  Timed Get Up and Go performed: N/A  Cognitive Function: MMSE - Mini Mental State Exam 08/25/2019  Orientation to time 5  Orientation to Place 5  Registration 3  Attention/ Calculation 0  Recall 3  Language- repeat 1       Mini Cog  Mini-Cog screen was completed. Maximum score is 22. A value of 0 denotes this part of the MMSE was not completed or the patient failed this part of the Mini-Cog screening.  Screening Tests Health Maintenance  Topic Date Due  . COVID-19 Vaccine (1) Never done  . INFLUENZA VACCINE  12/06/2019  . PNA vac Low Risk Adult (2 of 2 - PCV13) 03/01/2020  . TETANUS/TDAP  01/31/2025  . COLONOSCOPY  03/28/2025  . Hepatitis C Screening  Completed  . HIV Screening  Completed    Qualifies for Shingles Vaccine: Yes   Cancer Screenings: Lung: Low Dose CT Chest recommended if Age 64-80 years, 30 pack-year  currently smoking OR have quit w/in 15 years. Patient does not qualify. Colorectal: completed  03/29/2015  Additional Screenings:  Hepatitis C Screening: 02/01/2015      Plan:   Patient will maintain and continue medications as prescribed.   I have personally reviewed and noted the following in the patient's chart:   . Medical and social history . Use of alcohol, tobacco or illicit drugs  . Current medications and supplements . Functional ability and status . Nutritional status . Physical activity . Advanced directives . List of other physicians . Hospitalizations, surgeries, and ER visits in previous 12 months . Vitals . Screenings to include cognitive, depression, and falls . Referrals and appointments  In addition, I have reviewed and discussed with patient certain preventive protocols, quality metrics, and best practice recommendations. A written personalized care plan for preventive services as well as general preventive health recommendations were provided to patient.     Andrez Grime, LPN   [id number]

## 2019-08-25 NOTE — Progress Notes (Signed)
PCP notes:  Health Maintenance: No gaps noted    Abnormal Screenings: none   Patient concerns: none   Nurse concerns: none   Next PCP appt: none 

## 2019-08-25 NOTE — Patient Instructions (Signed)
Stephen Hanna , Thank you for taking time to come for your Medicare Wellness Visit. I appreciate your ongoing commitment to your health goals. Please review the following plan we discussed and let me know if I can assist you in the future.   Screening recommendations/referrals: Colonoscopy: Up to date, completed 03/29/2015 Recommended yearly ophthalmology/optometry visit for glaucoma screening and checkup Recommended yearly dental visit for hygiene and checkup  Vaccinations: Influenza vaccine: Up to date, completed 03/02/2019 Pneumococcal vaccine: Up to date, completed 03/02/2019 Tdap vaccine: Up to date, completed 02/01/2015 Shingles vaccine: discussed    Advanced directives: Please bring a copy of your POA (Power of [place]) and/or Living Will to your next appointment.   Conditions/risks identified: hyperlipidemia  Next appointment: none  Preventive Care 66 Years and Older, Male Preventive care refers to lifestyle choices and visits with your health care provider that can promote health and wellness. What does preventive care include?  A yearly physical exam. This is also called an annual well check.  Dental exams once or twice a year.  Routine eye exams. Ask your health care provider how often you should have your eyes checked.  Personal lifestyle choices, including:  Daily care of your teeth and gums.  Regular physical activity.  Eating a healthy diet.  Avoiding tobacco and drug use.  Limiting alcohol use.  Practicing safe sex.  Taking low doses of aspirin every day.  Taking vitamin and mineral supplements as recommended by your health care provider. What happens during an annual well check? The services and screenings done by your health care provider during your annual well check will depend on your age, overall health, lifestyle risk factors, and family history of disease. Counseling  Your health care provider may ask you questions about your:  Alcohol  use.  Tobacco use.  Drug use.  Emotional well-being.  Home and relationship well-being.  Sexual activity.  Eating habits.  History of falls.  Memory and ability to understand (cognition).  Work and work Statistician. Screening  You may have the following tests or measurements:  Height, weight, and BMI.  Blood pressure.  Lipid and cholesterol levels. These may be checked every 5 years, or more frequently if you are over 25 years old.  Skin check.  Lung cancer screening. You may have this screening every year starting at age 66 if you have a 30-pack-year history of smoking and currently smoke or have quit within the past 15 years.  Fecal occult blood test (FOBT) of the stool. You may have this test every year starting at age 45.  Flexible sigmoidoscopy or colonoscopy. You may have a sigmoidoscopy every 5 years or a colonoscopy every 10 years starting at age 57.  Prostate cancer screening. Recommendations will vary depending on your family history and other risks.  Hepatitis C blood test.  Hepatitis B blood test.  Sexually transmitted disease (STD) testing.  Diabetes screening. This is done by checking your blood sugar (glucose) after you have not eaten for a while (fasting). You may have this done every 1-3 years.  Abdominal aortic aneurysm (AAA) screening. You may need this if you are a current or former smoker.  Osteoporosis. You may be screened starting at age 66 if you are at high risk. Talk with your health care provider about your test results, treatment options, and if necessary, the need for more tests. Vaccines  Your health care provider may recommend certain vaccines, such as:  Influenza vaccine. This is recommended every year.  Tetanus, diphtheria,  and acellular pertussis (Tdap, Td) vaccine. You may need a Td booster every 10 years.  Zoster vaccine. You may need this after age 34.  Pneumococcal 13-valent conjugate (PCV13) vaccine. One dose is  recommended after age 66.  Pneumococcal polysaccharide (PPSV23) vaccine. One dose is recommended after age 37. Talk to your health care provider about which screenings and vaccines you need and how often you need them. This information is not intended to replace advice given to you by your health care provider. Make sure you discuss any questions you have with your health care provider. Document Released: 05/20/2015 Document Revised: 01/11/2016 Document Reviewed: 02/22/2015 Elsevier Interactive Patient Education  2017 [place] Prevention in the Home Falls can cause injuries. They can happen to people of all ages. There are many things you can do to make your home safe and to help prevent falls. What can I do on the outside of my home?  Regularly fix the edges of walkways and driveways and fix any cracks.  Remove anything that might make you trip as you walk through a door, such as a raised step or threshold.  Trim any bushes or trees on the path to your home.  Use bright outdoor lighting.  Clear any walking paths of anything that might make someone trip, such as rocks or tools.  Regularly check to see if handrails are loose or broken. Make sure that both sides of any steps have handrails.  Any raised decks and porches should have guardrails on the edges.  Have any leaves, snow, or ice cleared regularly.  Use sand or salt on walking paths during winter.  Clean up any spills in your garage right away. This includes oil or grease spills. What can I do in the bathroom?  Use night lights.  Install grab bars by the toilet and in the tub and shower. Do not use towel bars as grab bars.  Use non-skid mats or decals in the tub or shower.  If you need to sit down in the shower, use a plastic, non-slip stool.  Keep the floor dry. Clean up any water that spills on the floor as soon as it happens.  Remove soap buildup in the tub or shower regularly.  Attach bath mats  securely with double-sided non-slip rug tape.  Do not have throw rugs and other things on the floor that can make you trip. What can I do in the bedroom?  Use night lights.  Make sure that you have a light by your bed that is easy to reach.  Do not use any sheets or blankets that are too big for your bed. They should not hang down onto the floor.  Have a firm chair that has side arms. You can use this for support while you get dressed.  Do not have throw rugs and other things on the floor that can make you trip. What can I do in the kitchen?  Clean up any spills right away.  Avoid walking on wet floors.  Keep items that you use a lot in easy-to-reach places.  If you need to reach something above you, use a strong step stool that has a grab bar.  Keep electrical cords out of the way.  Do not use floor polish or wax that makes floors slippery. If you must use wax, use non-skid floor wax.  Do not have throw rugs and other things on the floor that can make you trip. What can I do with my  stairs?  Do not leave any items on the stairs.  Make sure that there are handrails on both sides of the stairs and use them. Fix handrails that are broken or loose. Make sure that handrails are as long as the stairways.  Check any carpeting to make sure that it is firmly attached to the stairs. Fix any carpet that is loose or worn.  Avoid having throw rugs at the top or bottom of the stairs. If you do have throw rugs, attach them to the floor with carpet tape.  Make sure that you have a light switch at the top of the stairs and the bottom of the stairs. If you do not have them, ask someone to add them for you. What else can I do to help prevent falls?  Wear shoes that:  Do not have high heels.  Have rubber bottoms.  Are comfortable and fit you well.  Are closed at the toe. Do not wear sandals.  If you use a stepladder:  Make sure that it is fully opened. Do not climb a closed  stepladder.  Make sure that both sides of the stepladder are locked into place.  Ask someone to hold it for you, if possible.  Clearly mark and make sure that you can see:  Any grab bars or handrails.  First and last steps.  Where the edge of each step is.  Use tools that help you move around (mobility aids) if they are needed. These include:  Canes.  Walkers.  Scooters.  Crutches.  Turn on the lights when you go into a dark area. Replace any light bulbs as soon as they burn out.  Set up your furniture so you have a clear path. Avoid moving your furniture around.  If any of your floors are uneven, fix them.  If there are any pets around you, be aware of where they are.  Review your medicines with your doctor. Some medicines can make you feel dizzy. This can increase your chance of falling. Ask your doctor what other things that you can do to help prevent falls. This information is not intended to replace advice given to you by your health care provider. Make sure you discuss any questions you have with your health care provider. Document Released: 02/17/2009 Document Revised: 09/29/2015 Document Reviewed: 05/28/2014 Elsevier Interactive Patient Education  2017 [employer].

## 2019-08-28 ENCOUNTER — Other Ambulatory Visit: Payer: Self-pay

## 2019-08-28 ENCOUNTER — Encounter
Admission: RE | Admit: 2019-08-28 | Discharge: 2019-08-28 | Disposition: A | Discharge status: Home / Self Care | Payer: Medicare Other | Source: Ambulatory Visit | Attending: Surgery | Admitting: Surgery

## 2019-08-28 DIAGNOSIS — Z01818 Encounter for other preprocedural examination: Secondary | ICD-10-CM | POA: Insufficient documentation

## 2019-08-28 HISTORY — DX: Personal history of urinary calculi: Z87.442

## 2019-08-28 HISTORY — DX: Gastro-esophageal reflux disease without esophagitis: K21.9

## 2019-08-28 HISTORY — DX: Unspecified osteoarthritis, unspecified site: M19.90

## 2019-08-28 NOTE — Patient Instructions (Addendum)
Your procedure is scheduled on: 09-03-19 THURSDAY Report to Same Day Surgery 2nd floor medical mall [facility] Entrance-take elevator on left to 2nd floor.  Check in with surgery information desk.) To find out your arrival time please call [phone] between 1PM - 3PM on 09-02-19 [facility]  Remember: Instructions that are not followed completely may result in serious medical risk, up to and including death, or upon the discretion of your surgeon and anesthesiologist your surgery may need to be rescheduled.    _x___ 1. Do not eat food after midnight the night before your procedure. NO GUM OR CANDY AFTER MIDNIGHT. You may drink clear liquids up to 2 hours before you are scheduled to arrive at the hospital for your procedure.  Do not drink clear liquids within 2 hours of your scheduled arrival to the hospital.  Clear liquids include  --Water or Apple juice without pulp  --Gatorade  --Black Coffee or Clear Tea (No milk, no creamers, do not add anything to the coffee or Tea   ____Ensure clear carbohydrate drink on the way to the hospital for bariatric patients  ____Ensure clear carbohydrate drink 3 hours before surgery.    __x__ 2. No Alcohol for 24 hours before or after surgery.   __x__3. No Smoking or e-cigarettes for 24 prior to surgery.  Do not use any chewable tobacco products for at least 6 hour prior to surgery   ____  4. Bring all medications with you on the day of surgery if instructed.    __x__ 5. Notify your doctor if there is any change in your medical condition     (cold, fever, infections).    x___6. On the morning of surgery brush your teeth with toothpaste and water.  You may rinse your mouth with mouth wash if you wish.  Do not swallow any toothpaste or mouthwash.   Do not wear jewelry, make-up, hairpins, clips or nail polish.  Do not wear lotions, powders, or perfumes  Do not shave 48 hours prior to surgery. Men may shave face and neck.  Do not bring valuables to  the hospital.    [facility] is not responsible for any belongings or valuables.               Contacts, dentures or bridgework may not be worn into surgery.  Leave your suitcase in the car. After surgery it may be brought to your room.  For patients admitted to the hospital, discharge time is determined by your  treatment team.  _  Patients discharged the day of surgery will not be allowed to drive home.  You will need someone to drive you home and stay with you the night of your procedure.    Please read over the following fact sheets that you were given:   [facility] Preparing for Surgery  _x___ TAKE THE FOLLOWING MEDICATION THE MORNING OF SURGERY WITH A SMALL SIP OF WATER. These include:  1. LIPITOR (ATORVASTATIN)  2. YOU MAY TAKE OXYCODONE (PERCOCET) FOR PAIN IF NEEDED  3.  4.  5.  6.  ____Fleets enema or Magnesium Citrate as directed.   _x___ Use CHG Soap or sage wipes as directed on instruction sheet   ____ Use inhalers on the day of surgery and bring to hospital day of surgery  ____ Stop Metformin and Janumet 2 days prior to surgery.    ____ Take 1/2 of usual insulin dose the night before surgery and none on the morning surgery.   ____  Follow recommendations from Cardiologist, Pulmonologist or PCP regarding stopping Aspirin, Coumadin, Plavix ,Eliquis, Effient, or Pradaxa, and Pletal.  X____Stop Anti-inflammatories such as Advil, Aleve, Ibuprofen, Motrin, Naproxen, Naprosyn, Goodies powders or aspirin products NOW-OK to take Tylenol    ____ Stop supplements until after surgery.     ____ Bring C-Pap to the hospital.

## 2019-09-01 ENCOUNTER — Other Ambulatory Visit: Payer: Self-pay

## 2019-09-01 ENCOUNTER — Other Ambulatory Visit
Admission: RE | Admit: 2019-09-01 | Discharge: 2019-09-01 | Disposition: A | Discharge status: Home / Self Care | Payer: Medicare Other | Source: Ambulatory Visit | Attending: Surgery | Admitting: Surgery

## 2019-09-01 DIAGNOSIS — I10 Essential (primary) hypertension: Secondary | ICD-10-CM | POA: Diagnosis not present

## 2019-09-01 DIAGNOSIS — Z885 Allergy status to narcotic agent status: Secondary | ICD-10-CM | POA: Diagnosis not present

## 2019-09-01 DIAGNOSIS — E785 Hyperlipidemia, unspecified: Secondary | ICD-10-CM | POA: Diagnosis not present

## 2019-09-01 DIAGNOSIS — Z20822 Contact with and (suspected) exposure to covid-19: Secondary | ICD-10-CM | POA: Diagnosis not present

## 2019-09-01 DIAGNOSIS — Z87891 Personal history of nicotine dependence: Secondary | ICD-10-CM | POA: Diagnosis not present

## 2019-09-01 DIAGNOSIS — K449 Diaphragmatic hernia without obstruction or gangrene: Secondary | ICD-10-CM | POA: Diagnosis not present

## 2019-09-01 DIAGNOSIS — Z841 Family history of disorders of kidney and ureter: Secondary | ICD-10-CM | POA: Diagnosis not present

## 2019-09-01 DIAGNOSIS — Z8249 Family history of ischemic heart disease and other diseases of the circulatory system: Secondary | ICD-10-CM | POA: Diagnosis not present

## 2019-09-01 DIAGNOSIS — Z888 Allergy status to other drugs, medicaments and biological substances status: Secondary | ICD-10-CM | POA: Diagnosis not present

## 2019-09-01 LAB — SARS CORONAVIRUS 2 (TAT 6-24 HRS): SARS Coronavirus 2: NEGATIVE

## 2019-09-03 ENCOUNTER — Inpatient Hospital Stay
Admission: RE | Admit: 2019-09-03 | Discharge: 2019-09-04 | DRG: 328 | Disposition: A | Discharge status: Home / Self Care | Payer: Medicare Other | Source: Ambulatory Visit | Attending: Surgery | Admitting: Surgery

## 2019-09-03 ENCOUNTER — Encounter: Payer: Self-pay | Admitting: Surgery

## 2019-09-03 ENCOUNTER — Encounter
Admission: RE | Disposition: A | Discharge status: Home / Self Care | Payer: Self-pay | Source: Ambulatory Visit | Attending: Surgery

## 2019-09-03 ENCOUNTER — Other Ambulatory Visit: Payer: Self-pay

## 2019-09-03 ENCOUNTER — Inpatient Hospital Stay: Payer: Medicare Other | Admitting: Anesthesiology

## 2019-09-03 ENCOUNTER — Inpatient Hospital Stay: Payer: Medicare Other

## 2019-09-03 DIAGNOSIS — Z818 Family history of other mental and behavioral disorders: Secondary | ICD-10-CM

## 2019-09-03 DIAGNOSIS — K449 Diaphragmatic hernia without obstruction or gangrene: Secondary | ICD-10-CM

## 2019-09-03 DIAGNOSIS — Z885 Allergy status to narcotic agent status: Secondary | ICD-10-CM | POA: Diagnosis not present

## 2019-09-03 DIAGNOSIS — I10 Essential (primary) hypertension: Secondary | ICD-10-CM | POA: Diagnosis present

## 2019-09-03 DIAGNOSIS — J9811 Atelectasis: Secondary | ICD-10-CM | POA: Diagnosis not present

## 2019-09-03 DIAGNOSIS — Z888 Allergy status to other drugs, medicaments and biological substances status: Secondary | ICD-10-CM | POA: Diagnosis not present

## 2019-09-03 DIAGNOSIS — E785 Hyperlipidemia, unspecified: Secondary | ICD-10-CM | POA: Diagnosis present

## 2019-09-03 DIAGNOSIS — Z9889 Other specified postprocedural states: Secondary | ICD-10-CM

## 2019-09-03 DIAGNOSIS — Z841 Family history of disorders of kidney and ureter: Secondary | ICD-10-CM

## 2019-09-03 DIAGNOSIS — Z87891 Personal history of nicotine dependence: Secondary | ICD-10-CM

## 2019-09-03 DIAGNOSIS — Z8249 Family history of ischemic heart disease and other diseases of the circulatory system: Secondary | ICD-10-CM

## 2019-09-03 DIAGNOSIS — Z20822 Contact with and (suspected) exposure to covid-19: Secondary | ICD-10-CM | POA: Diagnosis present

## 2019-09-03 DIAGNOSIS — N400 Enlarged prostate without lower urinary tract symptoms: Secondary | ICD-10-CM | POA: Diagnosis present

## 2019-09-03 HISTORY — PX: XI ROBOTIC ASSISTED PARAESOPHAGEAL HERNIA REPAIR: SHX6871

## 2019-09-03 LAB — CBC
HCT: 44.4 % (ref 39.0–52.0)
Hemoglobin: 14.8 g/dL (ref 13.0–17.0)
MCH: 29.6 pg (ref 26.0–34.0)
MCHC: 33.3 g/dL (ref 30.0–36.0)
MCV: 88.8 fL (ref 80.0–100.0)
Platelets: 220 10*3/uL (ref 150–400)
RBC: 5 MIL/uL (ref 4.22–5.81)
RDW: 14.6 % (ref 11.5–15.5)
WBC: 13.6 10*3/uL — ABNORMAL HIGH (ref 4.0–10.5)
nRBC: 0 % (ref 0.0–0.2)

## 2019-09-03 LAB — CREATININE, SERUM
Creatinine, Ser: 0.87 mg/dL (ref 0.61–1.24)
GFR calc Af Amer: >60 mL/min (ref >60)
GFR calc non Af Amer: >60 mL/min (ref >60)

## 2019-09-03 SURGERY — REPAIR, HERNIA, PARAESOPHAGEAL, ROBOT-ASSISTED
Anesthesia: General

## 2019-09-03 MED ORDER — FENTANYL CITRATE (PF) 100 MCG/2ML IJ SOLN
INTRAMUSCULAR | Status: AC
  Filled 2019-09-03: qty 2

## 2019-09-03 MED ORDER — MORPHINE SULFATE (PF) 4 MG/ML IV SOLN
2.0000 mg | INTRAVENOUS | Status: DC | PRN
  Administered 2019-09-03: 2 mg via INTRAVENOUS
  Filled 2019-09-03: qty 1

## 2019-09-03 MED ORDER — DEXAMETHASONE SODIUM PHOSPHATE 10 MG/ML IJ SOLN
INTRAMUSCULAR | Status: AC
  Filled 2019-09-03: qty 1

## 2019-09-03 MED ORDER — ONDANSETRON 4 MG PO TBDP: 4.0000 mg | ORAL_TABLET | Freq: Four times a day (QID) | ORAL | Status: DC | PRN

## 2019-09-03 MED ORDER — VASOPRESSIN 20 UNIT/ML IV SOLN
INTRAVENOUS | Status: AC
  Filled 2019-09-03: qty 1

## 2019-09-03 MED ORDER — METHOCARBAMOL 500 MG PO TABS: 500.0000 mg | ORAL_TABLET | Freq: Four times a day (QID) | ORAL | Status: DC | PRN

## 2019-09-03 MED ORDER — HYDROMORPHONE HCL 1 MG/ML IJ SOLN
INTRAMUSCULAR | Status: AC
  Filled 2019-09-03: qty 1

## 2019-09-03 MED ORDER — SUGAMMADEX SODIUM 500 MG/5ML IV SOLN
INTRAVENOUS | Status: DC | PRN
  Administered 2019-09-03: 550 mg via INTRAVENOUS

## 2019-09-03 MED ORDER — ROCURONIUM BROMIDE 100 MG/10ML IV SOLN
INTRAVENOUS | Status: DC | PRN
  Administered 2019-09-03: 50 mg via INTRAVENOUS
  Administered 2019-09-03: 20 mg via INTRAVENOUS
  Administered 2019-09-03: 30 mg via INTRAVENOUS
  Administered 2019-09-03: 40 mg via INTRAVENOUS

## 2019-09-03 MED ORDER — ENOXAPARIN SODIUM 40 MG/0.4ML ~~LOC~~ SOLN
40.0000 mg | SUBCUTANEOUS | Status: DC
  Filled 2019-09-03: qty 0.4

## 2019-09-03 MED ORDER — GLYCOPYRROLATE 0.2 MG/ML IJ SOLN
INTRAMUSCULAR | Status: DC | PRN
  Administered 2019-09-03: .2 mg via INTRAVENOUS

## 2019-09-03 MED ORDER — SUGAMMADEX SODIUM 500 MG/5ML IV SOLN
INTRAVENOUS | Status: AC
  Filled 2019-09-03: qty 5

## 2019-09-03 MED ORDER — SODIUM CHLORIDE 0.9 % IV SOLN: INTRAVENOUS | Status: DC

## 2019-09-03 MED ORDER — CELECOXIB 200 MG PO CAPS
ORAL_CAPSULE | ORAL | Status: AC
  Administered 2019-09-03: 200 mg via ORAL
  Filled 2019-09-03: qty 1

## 2019-09-03 MED ORDER — FENTANYL CITRATE (PF) 100 MCG/2ML IJ SOLN
INTRAMUSCULAR | Status: DC | PRN
  Administered 2019-09-03 (×2): 50 ug via INTRAVENOUS

## 2019-09-03 MED ORDER — ROCURONIUM BROMIDE 10 MG/ML (PF) SYRINGE
PREFILLED_SYRINGE | INTRAVENOUS | Status: AC
  Filled 2019-09-03: qty 10

## 2019-09-03 MED ORDER — ONDANSETRON HCL 4 MG/2ML IJ SOLN: 4.0000 mg | Freq: Four times a day (QID) | INTRAMUSCULAR | Status: DC | PRN

## 2019-09-03 MED ORDER — PHENYLEPHRINE HCL (PRESSORS) 10 MG/ML IV SOLN
INTRAVENOUS | Status: AC
  Filled 2019-09-03: qty 1

## 2019-09-03 MED ORDER — EPHEDRINE 5 MG/ML INJ
INTRAVENOUS | Status: AC
  Filled 2019-09-03: qty 10

## 2019-09-03 MED ORDER — EPHEDRINE SULFATE 50 MG/ML IJ SOLN
INTRAMUSCULAR | Status: DC | PRN
  Administered 2019-09-03 (×2): 5 mg via INTRAVENOUS

## 2019-09-03 MED ORDER — PROPOFOL 10 MG/ML IV BOLUS
INTRAVENOUS | Status: AC
  Filled 2019-09-03: qty 40

## 2019-09-03 MED ORDER — CEFAZOLIN SODIUM-DEXTROSE 2-4 GM/100ML-% IV SOLN
2.0000 g | INTRAVENOUS | Status: AC
  Administered 2019-09-03: 2 g via INTRAVENOUS

## 2019-09-03 MED ORDER — MIDAZOLAM HCL 2 MG/2ML IJ SOLN
INTRAMUSCULAR | Status: AC
  Filled 2019-09-03: qty 2

## 2019-09-03 MED ORDER — ACETAMINOPHEN 500 MG PO TABS
1000.0000 mg | ORAL_TABLET | Freq: Four times a day (QID) | ORAL | Status: DC
  Administered 2019-09-03 – 2019-09-04 (×3): 1000 mg via ORAL
  Filled 2019-09-03 (×3): qty 2

## 2019-09-03 MED ORDER — MIDAZOLAM HCL 2 MG/2ML IJ SOLN
INTRAMUSCULAR | Status: DC | PRN
  Administered 2019-09-03: 2 mg via INTRAVENOUS

## 2019-09-03 MED ORDER — PROCHLORPERAZINE MALEATE 10 MG PO TABS
10.0000 mg | ORAL_TABLET | Freq: Four times a day (QID) | ORAL | Status: DC | PRN
  Filled 2019-09-03: qty 1

## 2019-09-03 MED ORDER — ACETAMINOPHEN 500 MG PO TABS
ORAL_TABLET | ORAL | Status: AC
  Administered 2019-09-03: 1000 mg via ORAL
  Filled 2019-09-03: qty 2

## 2019-09-03 MED ORDER — PROPOFOL 10 MG/ML IV BOLUS
INTRAVENOUS | Status: DC | PRN
  Administered 2019-09-03: 150 mg via INTRAVENOUS

## 2019-09-03 MED ORDER — BUPIVACAINE-EPINEPHRINE 0.25% -1:200000 IJ SOLN
INTRAMUSCULAR | Status: DC | PRN
  Administered 2019-09-03: 30 mL

## 2019-09-03 MED ORDER — PHENYLEPHRINE HCL (PRESSORS) 10 MG/ML IV SOLN
INTRAVENOUS | Status: DC | PRN
  Administered 2019-09-03: 100 ug via INTRAVENOUS

## 2019-09-03 MED ORDER — PANTOPRAZOLE SODIUM 40 MG IV SOLR
40.0000 mg | Freq: Every day | INTRAVENOUS | Status: DC
  Administered 2019-09-03: 40 mg via INTRAVENOUS
  Filled 2019-09-03 (×2): qty 40

## 2019-09-03 MED ORDER — PREGABALIN 50 MG PO CAPS
100.0000 mg | ORAL_CAPSULE | Freq: Two times a day (BID) | ORAL | Status: DC
  Administered 2019-09-03 – 2019-09-04 (×2): 100 mg via ORAL
  Filled 2019-09-03 (×2): qty 2
  Filled 2019-09-03: qty 1

## 2019-09-03 MED ORDER — BUPIVACAINE LIPOSOME 1.3 % IJ SUSP
INTRAMUSCULAR | Status: DC | PRN
  Administered 2019-09-03: 20 mL

## 2019-09-03 MED ORDER — GABAPENTIN 300 MG PO CAPS: 300.0000 mg | ORAL_CAPSULE | ORAL | Status: AC

## 2019-09-03 MED ORDER — GLYCOPYRROLATE 0.2 MG/ML IJ SOLN
INTRAMUSCULAR | Status: AC
  Filled 2019-09-03: qty 1

## 2019-09-03 MED ORDER — HYDROMORPHONE HCL 1 MG/ML IJ SOLN
0.2500 mg | INTRAMUSCULAR | Status: DC | PRN
  Administered 2019-09-03 (×6): 0.25 mg via INTRAVENOUS

## 2019-09-03 MED ORDER — LIDOCAINE HCL (PF) 2 % IJ SOLN
INTRAMUSCULAR | Status: AC
  Filled 2019-09-03: qty 5

## 2019-09-03 MED ORDER — KETOROLAC TROMETHAMINE 15 MG/ML IJ SOLN
15.0000 mg | Freq: Four times a day (QID) | INTRAMUSCULAR | Status: DC
  Administered 2019-09-03 – 2019-09-04 (×2): 15 mg via INTRAVENOUS
  Filled 2019-09-03 (×3): qty 1

## 2019-09-03 MED ORDER — BUPIVACAINE HCL (PF) 0.25 % IJ SOLN
INTRAMUSCULAR | Status: AC
  Filled 2019-09-03: qty 30

## 2019-09-03 MED ORDER — CHLORHEXIDINE GLUCONATE CLOTH 2 % EX PADS
6.0000 | MEDICATED_PAD | Freq: Once | CUTANEOUS | Status: AC
  Administered 2019-09-03: 6 via TOPICAL

## 2019-09-03 MED ORDER — GABAPENTIN 300 MG PO CAPS
ORAL_CAPSULE | ORAL | Status: AC
  Administered 2019-09-03: 300 mg via ORAL
  Filled 2019-09-03: qty 1

## 2019-09-03 MED ORDER — FAMOTIDINE 20 MG PO TABS: 20.0000 mg | ORAL_TABLET | Freq: Once | ORAL | Status: AC

## 2019-09-03 MED ORDER — SODIUM CHLORIDE (PF) 0.9 % IJ SOLN
INTRAMUSCULAR | Status: DC | PRN
  Administered 2019-09-03: 50 mL

## 2019-09-03 MED ORDER — DIPHENHYDRAMINE HCL 25 MG PO CAPS: 25.0000 mg | ORAL_CAPSULE | Freq: Four times a day (QID) | ORAL | Status: DC | PRN

## 2019-09-03 MED ORDER — ONDANSETRON HCL 4 MG/2ML IJ SOLN
INTRAMUSCULAR | Status: AC
  Filled 2019-09-03: qty 4

## 2019-09-03 MED ORDER — FAMOTIDINE 20 MG PO TABS
ORAL_TABLET | ORAL | Status: AC
  Administered 2019-09-03: 20 mg via ORAL
  Filled 2019-09-03: qty 1

## 2019-09-03 MED ORDER — HYDROMORPHONE HCL 1 MG/ML IJ SOLN
INTRAMUSCULAR | Status: DC | PRN
  Administered 2019-09-03: .5 mg via INTRAVENOUS

## 2019-09-03 MED ORDER — SUCCINYLCHOLINE CHLORIDE 200 MG/10ML IV SOSY
PREFILLED_SYRINGE | INTRAVENOUS | Status: AC
  Filled 2019-09-03: qty 10

## 2019-09-03 MED ORDER — KETOROLAC TROMETHAMINE 15 MG/ML IJ SOLN
INTRAMUSCULAR | Status: AC
  Filled 2019-09-03: qty 1

## 2019-09-03 MED ORDER — ONDANSETRON HCL 4 MG/2ML IJ SOLN: 4.0000 mg | Freq: Once | INTRAMUSCULAR | Status: DC | PRN

## 2019-09-03 MED ORDER — BUPIVACAINE LIPOSOME 1.3 % IJ SUSP
INTRAMUSCULAR | Status: AC
  Filled 2019-09-03: qty 20

## 2019-09-03 MED ORDER — CELECOXIB 200 MG PO CAPS: 200.0000 mg | ORAL_CAPSULE | ORAL | Status: AC

## 2019-09-03 MED ORDER — VASOPRESSIN 20 UNIT/ML IV SOLN
INTRAVENOUS | Status: DC | PRN
  Administered 2019-09-03: 2 [IU] via INTRAVENOUS

## 2019-09-03 MED ORDER — LACTATED RINGERS IV SOLN: INTRAVENOUS | Status: DC

## 2019-09-03 MED ORDER — EPINEPHRINE PF 1 MG/ML IJ SOLN
INTRAMUSCULAR | Status: AC
  Filled 2019-09-03: qty 1

## 2019-09-03 MED ORDER — ONDANSETRON HCL 4 MG/2ML IJ SOLN
INTRAMUSCULAR | Status: DC | PRN
  Administered 2019-09-03: 4 mg via INTRAVENOUS

## 2019-09-03 MED ORDER — LIDOCAINE HCL (CARDIAC) PF 100 MG/5ML IV SOSY
PREFILLED_SYRINGE | INTRAVENOUS | Status: DC | PRN
  Administered 2019-09-03: 100 mg via INTRAVENOUS

## 2019-09-03 MED ORDER — LIDOCAINE 5 % EX PTCH
2.0000 | MEDICATED_PATCH | CUTANEOUS | Status: DC
  Administered 2019-09-03: 2 via TRANSDERMAL
  Filled 2019-09-03: qty 2

## 2019-09-03 MED ORDER — SODIUM CHLORIDE 0.9 % IV SOLN
INTRAVENOUS | Status: DC | PRN
  Administered 2019-09-03: 25 ug/min via INTRAVENOUS

## 2019-09-03 MED ORDER — PROCHLORPERAZINE EDISYLATE 10 MG/2ML IJ SOLN
5.0000 mg | Freq: Four times a day (QID) | INTRAMUSCULAR | Status: DC | PRN
  Filled 2019-09-03: qty 2

## 2019-09-03 MED ORDER — FENTANYL CITRATE (PF) 100 MCG/2ML IJ SOLN
25.0000 ug | INTRAMUSCULAR | Status: DC | PRN
  Administered 2019-09-03 (×4): 25 ug via INTRAVENOUS

## 2019-09-03 MED ORDER — ACETAMINOPHEN 500 MG PO TABS: 1000.0000 mg | ORAL_TABLET | ORAL | Status: AC

## 2019-09-03 MED ORDER — CEFAZOLIN SODIUM-DEXTROSE 2-4 GM/100ML-% IV SOLN
INTRAVENOUS | Status: AC
  Filled 2019-09-03: qty 100

## 2019-09-03 MED ORDER — DIPHENHYDRAMINE HCL 50 MG/ML IJ SOLN: 25.0000 mg | Freq: Four times a day (QID) | INTRAMUSCULAR | Status: DC | PRN

## 2019-09-03 MED ORDER — OXYCODONE HCL 5 MG PO TABS
5.0000 mg | ORAL_TABLET | ORAL | Status: DC | PRN
  Administered 2019-09-03 – 2019-09-04 (×2): 10 mg via ORAL
  Filled 2019-09-03 (×3): qty 2

## 2019-09-03 MED ORDER — HYDRALAZINE HCL 20 MG/ML IJ SOLN
10.0000 mg | INTRAMUSCULAR | Status: DC | PRN
  Filled 2019-09-03: qty 0.5

## 2019-09-03 MED ORDER — SODIUM CHLORIDE (PF) 0.9 % IJ SOLN
INTRAMUSCULAR | Status: AC
  Filled 2019-09-03: qty 50

## 2019-09-03 MED ORDER — DEXAMETHASONE SODIUM PHOSPHATE 10 MG/ML IJ SOLN
INTRAMUSCULAR | Status: DC | PRN
  Administered 2019-09-03: 10 mg via INTRAVENOUS

## 2019-09-03 SURGICAL SUPPLY — 58 items
ADH SKN CLS APL DERMABOND .7 (GAUZE/BANDAGES/DRESSINGS) ×1
APL PRP STRL LF DISP 70% ISPRP (MISCELLANEOUS) ×1
CANISTER SUCT 1200ML W/VALVE (MISCELLANEOUS) ×2 IMPLANT
CHLORAPREP W/TINT 26 (MISCELLANEOUS) ×2 IMPLANT
COVER WAND RF STERILE (DRAPES) ×2 IMPLANT
DECANTER SPIKE VIAL GLASS SM (MISCELLANEOUS) ×2 IMPLANT
DEFOGGER SCOPE WARMER CLEARIFY (MISCELLANEOUS) ×2 IMPLANT
DERMABOND ADVANCED (GAUZE/BANDAGES/DRESSINGS) ×1
DERMABOND ADVANCED .7 DNX12 (GAUZE/BANDAGES/DRESSINGS) ×1 IMPLANT
DRAPE 3/4 80X56 (DRAPES) ×1 IMPLANT
DRAPE ARM DVNC X/XI (DISPOSABLE) ×4 IMPLANT
DRAPE COLUMN DVNC XI (DISPOSABLE) ×1 IMPLANT
DRAPE DA VINCI XI ARM (DISPOSABLE) ×8
DRAPE DA VINCI XI COLUMN (DISPOSABLE) ×2
ELECT CAUTERY BLADE 6.4 (BLADE) ×2 IMPLANT
ELECT REM PT RETURN 9FT ADLT (ELECTROSURGICAL) ×2
ELECTRODE REM PT RTRN 9FT ADLT (ELECTROSURGICAL) ×1 IMPLANT
GLOVE BIO SURGEON STRL SZ7 (GLOVE) ×7 IMPLANT
GOWN STRL REUS W/ TWL LRG LVL3 (GOWN DISPOSABLE) ×4 IMPLANT
GOWN STRL REUS W/TWL LRG LVL3 (GOWN DISPOSABLE) ×8
GRASPER LAPSCPC 5X45 DSP (INSTRUMENTS) ×1 IMPLANT
IRRIGATION STRYKERFLOW (MISCELLANEOUS) IMPLANT
IRRIGATOR STRYKERFLOW (MISCELLANEOUS)
IV NS 1000ML (IV SOLUTION)
IV NS 1000ML BAXH (IV SOLUTION) IMPLANT
KIT PINK PAD W/HEAD ARE REST (MISCELLANEOUS) ×2
KIT PINK PAD W/HEAD ARM REST (MISCELLANEOUS) ×1 IMPLANT
KIT TURNOVER CYSTO (KITS) ×1 IMPLANT
LABEL OR SOLS (LABEL) ×2 IMPLANT
MESH HERNIA 3X4 RECT PHASIX (Mesh General) IMPLANT
MESH HERNIA 7X10 RECT PHASIX (Mesh General) ×1 IMPLANT
NEEDLE HYPO 22GX1.5 SAFETY (NEEDLE) ×2 IMPLANT
OBTURATOR OPTICAL STANDARD 8MM (TROCAR) ×2
OBTURATOR OPTICAL STND 8 DVNC (TROCAR) ×1
OBTURATOR OPTICALSTD 8 DVNC (TROCAR) ×1 IMPLANT
PACK LAP CHOLECYSTECTOMY (MISCELLANEOUS) ×2 IMPLANT
PENCIL ELECTRO HAND CTR (MISCELLANEOUS) ×2 IMPLANT
SEAL CANN UNIV 5-8 DVNC XI (MISCELLANEOUS) ×4 IMPLANT
SEAL XI 5MM-8MM UNIVERSAL (MISCELLANEOUS) ×8
SEALER VESSEL DA VINCI XI (MISCELLANEOUS)
SEALER VESSEL EXT DVNC XI (MISCELLANEOUS) ×1 IMPLANT
SOLUTION ELECTROLUBE (MISCELLANEOUS) ×2 IMPLANT
SPONGE LAP 18X18 RF (DISPOSABLE) ×2 IMPLANT
STAPLER CANNULA SEAL DVNC XI (STAPLE) IMPLANT
STAPLER CANNULA SEAL XI (STAPLE) ×2
SUT MNCRL 4-0 (SUTURE) ×2
SUT MNCRL 4-0 27XMFL (SUTURE) ×1
SUT PDS AB 0 CT1 27 (SUTURE) ×1 IMPLANT
SUT SILK 2 0 SH (SUTURE) ×4 IMPLANT
SUT VICRYL 0 AB UR-6 (SUTURE) ×4 IMPLANT
SUT VLOC 90 S/L VL9 GS22 (SUTURE) ×4 IMPLANT
SUTURE MNCRL 4-0 27XMF (SUTURE) ×1 IMPLANT
SYS LAPSCP GELPORT 120MM (MISCELLANEOUS) ×2
SYSTEM LAPSCP GELPORT 120MM (MISCELLANEOUS) IMPLANT
TRAY FOLEY SLVR 16FR LF STAT (SET/KITS/TRAYS/PACK) ×2 IMPLANT
TROCAR 130MM GELPORT  DAV (MISCELLANEOUS) ×2 IMPLANT
TROCAR XCEL NON-BLD 5MMX100MML (ENDOMECHANICALS) ×2 IMPLANT
TUBING EVAC SMOKE HEATED PNEUM (TUBING) ×2 IMPLANT

## 2019-09-03 NOTE — Anesthesia Preprocedure Evaluation (Signed)
Anesthesia Evaluation  Patient identified by MRN, date of birth, ID band Patient awake    Reviewed: Allergy & Precautions, NPO status , Patient's Chart, lab work & pertinent test results  History of Anesthesia Complications Negative for: history of anesthetic complications  Airway Mallampati: III       Dental   Pulmonary neg sleep apnea, neg COPD, Not current smoker, former smoker,           Cardiovascular hypertension (pt stopped taking meds), (-) Past MI and (-) CHF (-) dysrhythmias (-) Valvular Problems/Murmurs     Neuro/Psych neg Seizures    GI/Hepatic Neg liver ROS, GERD (rare)  ,  Endo/Other  neg diabetes  Renal/GU Renal disease (stones)     Musculoskeletal   Abdominal   Peds  Hematology   Anesthesia Other Findings   Reproductive/Obstetrics                             Anesthesia Physical Anesthesia Plan  ASA: II  Anesthesia Plan: General   Post-op Pain Management:    Induction: Intravenous  PONV Risk Score and Plan: 2  Airway Management Planned: Oral ETT  Additional Equipment:   Intra-op Plan:   Post-operative Plan:   Informed Consent: I have reviewed the patients History and Physical, chart, labs and discussed the procedure including the risks, benefits and alternatives for the proposed anesthesia with the patient or authorized representative who has indicated his/her understanding and acceptance.       Plan Discussed with:   Anesthesia Plan Comments:         Anesthesia Quick Evaluation

## 2019-09-03 NOTE — Transfer of Care (Signed)
Immediate Anesthesia Transfer of Care Note  Patient: JERMALL MAHLBERG  Procedure(s) Performed: XI ROBOTIC ASSISTED Diaphragmatic hernia REPAIR (N/A )  Patient Location: PACU  Anesthesia Type:General  Level of Consciousness: awake, drowsy and patient cooperative  Airway & Oxygen Therapy: Patient Spontanous Breathing and Patient connected to face mask oxygen  Post-op Assessment: Report given to RN and Post -op Vital signs reviewed and stable  Post vital signs: Reviewed and stable  Last Vitals:  Vitals Value Taken Time  BP 173/99 09/03/19 1250  Temp    Pulse 77 09/03/19 1253  Resp 18 09/03/19 1254  SpO2 100 % 09/03/19 1253  Vitals shown include unvalidated device data.  Last Pain:  Vitals:   09/03/19 0613  TempSrc: (P) Temporal  PainSc: (P) 0-No pain         Complications: No apparent anesthesia complications

## 2019-09-03 NOTE — Anesthesia Procedure Notes (Signed)
Procedure Name: Intubation Performed by: Kelton Pillar, CRNA Pre-anesthesia Checklist: Patient identified, Emergency Drugs available, Suction available and Patient being monitored Patient Re-evaluated:Patient Re-evaluated prior to induction Oxygen Delivery Method: Circle system utilized Preoxygenation: Pre-oxygenation with 100% oxygen Induction Type: IV induction Ventilation: Oral airway inserted - appropriate to patient size Laryngoscope Size: McGraph and 4 Grade View: Grade I Tube size: 7.0 mm Number of attempts: 1 Airway Equipment and Method: Stylet Placement Confirmation: ETT inserted through vocal cords under direct vision,  positive ETCO2,  CO2 detector and breath sounds checked- equal and bilateral Secured at: 22 cm Tube secured with: Tape Dental Injury: Teeth and Oropharynx as per pre-operative assessment

## 2019-09-03 NOTE — Op Note (Signed)
Robotic assisted laparoscopic repair of  Diaphragmatic  Hernia with phasix ST 10x7cms ( Morgagni)  Pre-operative Diagnosis: Diaphragmatic  hernia  Post-operative Diagnosis: same  Procedure:  Robotic assisted laparoscopic repair of  Diaphragmatic  Hernia with phasix ST 10x7cms Burnard Hawthorne)  Surgeon: Caroleen Hamman, MD FACS  Assistant: Dr. Genevive Bi. Required due to the complexity of the case the need for exposure and thoracic surgery expertise  Anesthesia: Gen. with endotracheal tube  Findings: 6 cm diaphragmatic hernia with chronically incarcerated transverse colon   Estimated Blood Loss: 15cc       Specimens: Hernia sac       Complications: none   Procedure Details  The patient was seen again in the Holding Room. The benefits, complications, treatment options, and expected outcomes were discussed with the patient. The risks of bleeding, infection, recurrence of symptoms, failure to resolve symptoms,  esophageal damage, Dysphagia, bowel injury, any of which could require further surgery were reviewed with the patient. The likelihood of improving the patient's symptoms with return to their baseline status is good.  The patient and/or family concurred with the proposed plan, giving informed consent.  The patient was taken to Operating Room, identified  and the procedure verified.  A Time Out was held and the above information confirmed.  Prior to the induction of general anesthesia, antibiotic prophylaxis was administered. VTE prophylaxis was in place. General endotracheal anesthesia was then administered and tolerated well. After the induction, the abdomen was prepped with Chloraprep and draped in the sterile fashion. The patient was positioned in the supine position.  Cut down technique was used to enter the abdominal cavity and a Hasson trochar was placed after two vicryl stitches were anchored to the fascia. Pneumoperitoneum was then created with CO2 and tolerated well without any adverse  changes in the patient's vital signs.  Three 8-mm ports were placed under direct vision. All skin incisions  were infiltrated with a local anesthetic agent before making the incision and placing the trocars. An additional 5 mm regular laparoscopic port was placed to assist with retraction and exposure.   The patient was positioned  in reverse Trendelenburg, robot was brought to the surgical field and docked in the standard fashion.  We made sure all the instrumentation was kept indirect view at all times and that there were no collision between the arms. I scrubbed out and went to the console.  I visualized the anterior diaphragmatic hernia.  There was chronic omentum and transverse colon in it.  Did try to reduce the hernia contents but it was very difficult and I was afraid that I was going to cause a potential colon injury.  I tried multiple methods as well as taking down the omentum from the transverse colon.  None of them worked.  After 30 to 45 minutes or so I decided to use my hand as a hybrid case to be able to safely reduce the contents of the hernia.  The robot was undocked and the incision was extended to 7 cm.  GelPort was placed and I was able to place my hand.  In assistance with the laparoscope after a few minutes I was able to finally reduce the colon into the abdominal cavity.  We inspected the colon under direct visualization and there was no evidence of any injuries. At this point I went ahead and redocked the robot The hernia sac was dissected free and excised.  Defect was measured. 2-0V lock suture was inserted and the  the hernia was  closed with 2 running sutures starting at each at edge of the defect. I then decided to place the mesh given that there was a little bit of tension with primary closure.  A 7 x 10 cm Phasix ST was inserted and  was attached to the diaphragm and abdominal wall with 2 interrupted V-Loc sutures. I was very happy with the way the mesh laid and the repair of  the hernia.  Inspection of the  upper quadrant was performed. No bleeding, bile  Or esophageal injuries leaks, or bowel injuries were noted. Robotic instruments and robotic arms were undocked in the standard fashion. All the needles were removed under direct visualization.   I scrubbed back in.  Pneumoperitoneum was released.  The midline fascia was closed with a running 0 PDS suture using a small bite technique 4-0 subcuticular Monocryl was used to close the skin. Liposomal marcaine was injected to all the incisions sites.  Dermabond was  applied.  The patient was then extubated and brought to the recovery room in stable condition. Sponge, lap, and needle counts were correct at closure and at the conclusion of the case.               Caroleen Hamman, MD, FACS

## 2019-09-03 NOTE — Interval H&P Note (Signed)
History and Physical Interval Note:  09/03/2019 7:31 AM  Stephen Hanna  has presented today for surgery, with the diagnosis of Diaphragmatic hernia.  The various methods of treatment have been discussed with the patient and family. After consideration of risks, benefits and other options for treatment, the patient has consented to  Procedure(s): XI ROBOTIC ASSISTED Diaphragmatic hernia REPAIR (N/A) as a surgical intervention.  The patient's history has been reviewed, patient examined, no change in status, stable for surgery.  I have reviewed the patient's chart and labs.  Questions were answered to the patient's satisfaction.     [place]

## 2019-09-04 LAB — BASIC METABOLIC PANEL
Anion gap: 5 (ref 5–15)
BUN: 19 mg/dL (ref 8–23)
CO2: 25 mmol/L (ref 22–32)
Calcium: 8.3 mg/dL — ABNORMAL LOW (ref 8.9–10.3)
Chloride: 110 mmol/L (ref 98–111)
Creatinine, Ser: 0.86 mg/dL (ref 0.61–1.24)
GFR calc Af Amer: >60 mL/min (ref >60)
GFR calc non Af Amer: >60 mL/min (ref >60)
Glucose, Bld: 138 mg/dL — ABNORMAL HIGH (ref 70–99)
Potassium: 4 mmol/L (ref 3.5–5.1)
Sodium: 140 mmol/L (ref 135–145)

## 2019-09-04 LAB — CBC
HCT: 39.6 % (ref 39.0–52.0)
Hemoglobin: 13.2 g/dL (ref 13.0–17.0)
MCH: 29.6 pg (ref 26.0–34.0)
MCHC: 33.3 g/dL (ref 30.0–36.0)
MCV: 88.8 fL (ref 80.0–100.0)
Platelets: 207 10*3/uL (ref 150–400)
RBC: 4.46 MIL/uL (ref 4.22–5.81)
RDW: 14.6 % (ref 11.5–15.5)
WBC: 11.4 10*3/uL — ABNORMAL HIGH (ref 4.0–10.5)
nRBC: 0 % (ref 0.0–0.2)

## 2019-09-04 LAB — SURGICAL PATHOLOGY

## 2019-09-04 LAB — HIV ANTIBODY (ROUTINE TESTING W REFLEX): HIV Screen 4th Generation wRfx: NONREACTIVE

## 2019-09-04 MED ORDER — OXYCODONE HCL 5 MG PO TABS: 5.0000 mg | ORAL_TABLET | ORAL | 0 refills | Status: DC | PRN

## 2019-09-04 MED ORDER — TIZANIDINE HCL 2 MG PO TABS: 2.0000 mg | ORAL_TABLET | Freq: Four times a day (QID) | ORAL | 0 refills | Status: DC | PRN

## 2019-09-04 MED ORDER — NAPROXEN 500 MG PO TABS
500.0000 mg | ORAL_TABLET | Freq: Two times a day (BID) | ORAL | 0 refills | Status: DC
Start: 2019-09-04 — End: 2019-11-05

## 2019-09-04 NOTE — Discharge Instructions (Signed)
In addition to included general post-operative instructions  Diet: Resume home diet.   Activity: No heavy lifting >20 pounds (children, pets, laundry, garbage) for about 6 weeks, but light activity and walking are encouraged. Do not drive or drink alcohol if taking narcotic pain medications or having pain that might distract from driving.  Wound care: 2 days after surgery (05/01), you may shower/get incision wet with soapy water and pat dry (do not rub incisions), but no baths or submerging incision underwater until follow-up.   Medications: Resume all home medications. For mild to moderate pain: acetaminophen (Tylenol) or ibuprofen/naproxen (if no kidney disease). Combining Tylenol with alcohol can substantially increase your risk of causing liver disease. Narcotic pain medications, if prescribed, can be used for severe pain, though may cause nausea, constipation, and drowsiness. Do not combine Tylenol and Percocet (or similar) within a 6 hour period as Percocet (and similar) contain(s) Tylenol. If you do not need the narcotic pain medication, you do not need to fill the prescription.  Call office [phone] / [phone]) at any time if any questions, worsening pain, fevers/chills, bleeding, drainage from incision site, or other concerns.

## 2019-09-04 NOTE — Discharge Summary (Signed)
[facility] SURGICAL DISCHARGE SUMMARY   Patient ID: Stephen Hanna MRN: [id number] DOB/AGE: [birth year] 64 y.o.  Admit date: 09/03/2019 Discharge date: 09/04/2019  Discharge Diagnoses Patient Active Problem List   Diagnosis Date Noted  . Diaphragmatic hernia 08/12/2019   Consultants None  Procedures 09/03/2019 Robotic assisted laparoscopic repair of Diaphragmatic Hernia with phasix ST 10x7cms ( Morgagni)  HPI: KHARTER REXROTH is a 64 y.o. male with a history of diaphragmatic hernia who presents to [facility] for hernia repair with Dr [facility] Course: Informed consent was obtained and documented, and patient underwent uneventful Robotic assisted laparoscopic repair of Diaphragmatic Hernia with phasix ST (Dr Dahlia Byes, 09/03/2019).  Post-operatively, patient's pain/symptoms improved/resolved and advancement of patient's diet and ambulation were well-tolerated. The remainder of patient's hospital course was essentially unremarkable, and discharge planning was initiated accordingly with patient safely able to be discharged home with appropriate discharge instructions, pain control, and outpatient follow-up after all of his questions were answered to his expressed satisfaction.   Discharge Condition: Good   Physical Examination:  Constitutional: Well appearing male, NAD Pulmonary: Normal effort, no respiratory distress Cardiac: HRRR Gastrointestinal: Soft, incisional soreness, non-distended, no rebound/guarding Skin: Laparoscopic incisions are CDI with dermabond, no erythema or drainage    Allergies as of 09/04/2019      Reactions   Atorvastatin    Aches at 30mg  but not 20mg  a day.    Chantix [varenicline]    Abnormal dreams   Hydrocodone-acetaminophen Other (See Comments)   edgy, intolerant   Lisinopril Other (See Comments)   cough      Medication List    STOP taking these medications   oxyCODONE-acetaminophen 5-325 MG tablet Commonly known as:  Percocet     TAKE these medications   atorvastatin 20 MG tablet Commonly known as: LIPITOR Take 20 mg by mouth every morning.   Lubricant Eye Drops 0.5 % Soln Generic drug: carboxymethylcellulose Place 1 drop into both eyes 3 (three) times daily as needed (dry/irritated eyes.).   naproxen 500 MG tablet Commonly known as: Naprosyn Take 1 tablet (500 mg total) by mouth 2 (two) times daily with a meal.   naproxen sodium 220 MG tablet Commonly known as: ALEVE Take 220 mg by mouth daily.   ondansetron 4 MG disintegrating tablet Commonly known as: Zofran ODT Take 1 tablet (4 mg total) by mouth every 8 (eight) hours as needed for nausea or vomiting.   oxyCODONE 5 MG immediate release tablet Commonly known as: Oxy IR/ROXICODONE Take 1-2 tablets (5-10 mg total) by mouth every 4 (four) hours as needed for moderate pain.   psyllium 58.6 % packet Commonly known as: METAMUCIL Take 1 packet by mouth daily.   tiZANidine 2 MG tablet Commonly known as: ZANAFLEX Take 1 tablet (2 mg total) by mouth every 6 (six) hours as needed for muscle spasms.   vitamin B-12 1000 MCG tablet Commonly known as: CYANOCOBALAMIN Take 1,000 mcg by mouth daily.        Follow-up Information    Pabon, [place] F, MD. Schedule an appointment as soon as possible for a visit in 2 week(s).   Specialty: General Surgery Why: s/p diaphragmatic hernia Contact information: [address] [place] [address] [phone]            Time spent on discharge management including discussion of hospital course, clinical condition, outpatient instructions, prescriptions, and follow up with the patient and members of the medical team: >30 minutes  -- Edison Simon , PA-C  [facility]  09/04/2019, 8:22 AM [phone] M-F: 7am - 4pm

## 2019-09-04 NOTE — Plan of Care (Signed)
Discharge teaching with patient who verbalizes understanding of teaching.  Patient is in stable condition and has all belongings.

## 2019-09-07 NOTE — Anesthesia Postprocedure Evaluation (Signed)
Anesthesia Post Note  Patient: Stephen Hanna  Procedure(s) Performed: XI ROBOTIC ASSISTED Diaphragmatic hernia REPAIR (N/A )  Patient location during evaluation: PACU Anesthesia Type: General Level of consciousness: awake and alert Pain management: pain level controlled Vital Signs Assessment: post-procedure vital signs reviewed and stable Respiratory status: spontaneous breathing, nonlabored ventilation and respiratory function stable Cardiovascular status: blood pressure returned to baseline and stable Postop Assessment: no apparent nausea or vomiting Anesthetic complications: no     Last Vitals:  Vitals:   09/04/19 0517 09/04/19 0819  BP: (!) 149/87 (!) 146/83  Pulse: 69 62  Resp:  15  Temp: (!) 36.3 C 37.1 C  SpO2: 97% 98%    Last Pain:  Vitals:   09/04/19 0900  TempSrc:   PainSc: 0-No pain                 Alphonsus Sias

## 2019-09-08 ENCOUNTER — Encounter: Payer: Self-pay | Admitting: *Deleted

## 2019-09-08 ENCOUNTER — Telehealth: Payer: Self-pay | Admitting: Surgery

## 2019-09-08 ENCOUNTER — Other Ambulatory Visit: Payer: Self-pay | Admitting: Emergency Medicine

## 2019-09-08 MED ORDER — GABAPENTIN 300 MG PO CAPS
300.0000 mg | ORAL_CAPSULE | Freq: Three times a day (TID) | ORAL | 0 refills | Status: DC
Start: 2019-09-08 — End: 2019-11-05

## 2019-09-08 MED ORDER — MAGNESIUM CITRATE PO SOLN
1.0000 | Freq: Once | ORAL | 0 refills | Status: AC
Start: 2019-09-08 — End: 2019-09-08

## 2019-09-08 NOTE — Telephone Encounter (Signed)
Patient is calling said he hasn't had a bowel movement as in a natural one only able to take something to be able to use it. Please call and advise.

## 2019-09-08 NOTE — Telephone Encounter (Signed)
Per Dr Dahlia Byes, Gabapentin and Magnesium citrate rx sent to pharmacy.   Pt made aware. Pt cancelled appt tomorrow and wants to keep original appt due to him wanting to give these rx time to work.  Pt advised to call the office if he has further concerns.

## 2019-09-09 ENCOUNTER — Encounter: Payer: Medicare Other | Admitting: Surgery

## 2019-09-16 ENCOUNTER — Ambulatory Visit (INDEPENDENT_AMBULATORY_CARE_PROVIDER_SITE_OTHER): Payer: Self-pay | Admitting: Surgery

## 2019-09-16 ENCOUNTER — Encounter: Payer: Medicare Other | Admitting: Surgery

## 2019-09-16 ENCOUNTER — Encounter: Payer: Self-pay | Admitting: Surgery

## 2019-09-16 ENCOUNTER — Other Ambulatory Visit: Payer: Self-pay

## 2019-09-16 VITALS — BP 143/86 | HR 68 | Temp 96.4°F | Ht 75.0 in | Wt 212.4 lb

## 2019-09-16 DIAGNOSIS — K449 Diaphragmatic hernia without obstruction or gangrene: Secondary | ICD-10-CM

## 2019-09-16 MED ORDER — OXYCODONE-ACETAMINOPHEN 5-325 MG PO TABS: 1.0000 | ORAL_TABLET | ORAL | 0 refills | Status: AC | PRN

## 2019-09-16 NOTE — Progress Notes (Signed)
S/p diaphragmatic hernia repair 4/29 Still has some periumbilical soreness Taking po No fevers or chills  PE NAD Abd: soft, incisions healing well, no infection, no hernias  A/p Doing well Last refill of percocet # 12 RTC 2 weeks virtual visit w CXR

## 2019-09-16 NOTE — Patient Instructions (Addendum)
Dr.Pabon discussed with patient within time the wound will heal and discomfort will go away. Patient will follow up in two weeks and patient will have a chest x-ray prior to the follow up appointment.  GENERAL POST-OPERATIVE PATIENT INSTRUCTIONS   FOLLOW-UP:  Please make an appointment with your physician in.  Call your physician immediately if you have any fevers greater than 102.5, drainage from you wound that is not clear or looks infected, persistent bleeding, increasing abdominal pain, problems urinating, or persistent nausea/vomiting.    WOUND CARE INSTRUCTIONS:  Keep a dry clean dressing on the wound if there is drainage. The initial bandage may be removed after 24 hours.  Once the wound has quit draining you may leave it open to air.  If clothing rubs against the wound or causes irritation and the wound is not draining you may cover it with a dry dressing during the daytime.  Try to keep the wound dry and avoid ointments on the wound unless directed to do so.  If the wound becomes bright red and painful or starts to drain infected material that is not clear, please contact your physician immediately.  If the wound is mildly pink and has a thick firm ridge underneath it, this is normal, and is referred to as a healing ridge.  This will resolve over the next 4-6 weeks.  DIET:  You may eat any foods that you can tolerate.  It is a good idea to eat a high fiber diet and take in plenty of fluids to prevent constipation.  If you do become constipated you may want to take a mild laxative or take ducolax tablets on a daily basis until your bowel habits are regular.  Constipation can be very uncomfortable, along with straining, after recent surgery.  ACTIVITY:  You are encouraged to cough and deep breath or use your incentive spirometer if you were given one, every 15-30 minutes when awake.  This will help prevent respiratory complications and low grade fevers post-operatively if you had a general  anesthetic.  You may want to hug a pillow when coughing and sneezing to add additional support to the surgical area, if you had abdominal or chest surgery, which will decrease pain during these times.  You are encouraged to walk and engage in light activity for the next two weeks.  You should not lift more than 20 pounds during this time frame as it could put you at increased risk for complications.  Twenty pounds is roughly equivalent to a plastic bag of groceries.    MEDICATIONS:  Try to take narcotic medications and anti-inflammatory medications, such as tylenol, ibuprofen, naprosyn, etc., with food.  This will minimize stomach upset from the medication.  Should you develop nausea and vomiting from the pain medication, or develop a rash, please discontinue the medication and contact your physician.  You should not drive, make important decisions, or operate machinery when taking narcotic pain medication.  QUESTIONS:  Please feel free to call your physician or the hospital operator if you have any questions, and they will be glad to assist you.

## 2019-09-22 ENCOUNTER — Other Ambulatory Visit: Payer: Self-pay

## 2019-09-22 ENCOUNTER — Ambulatory Visit
Admission: RE | Admit: 2019-09-22 | Discharge: 2019-09-22 | Disposition: A | Discharge status: Home / Self Care | Payer: Medicare Other | Attending: Surgery | Admitting: Surgery

## 2019-09-22 ENCOUNTER — Ambulatory Visit
Admission: RE | Admit: 2019-09-22 | Discharge: 2019-09-22 | Disposition: A | Discharge status: Home / Self Care | Payer: Medicare Other | Source: Ambulatory Visit | Attending: Surgery | Admitting: Surgery

## 2019-09-22 DIAGNOSIS — K449 Diaphragmatic hernia without obstruction or gangrene: Secondary | ICD-10-CM

## 2019-09-22 DIAGNOSIS — R918 Other nonspecific abnormal finding of lung field: Secondary | ICD-10-CM | POA: Diagnosis not present

## 2019-09-23 ENCOUNTER — Telehealth: Payer: Self-pay

## 2019-09-23 NOTE — Telephone Encounter (Signed)
Spoke with patient to let notify him of recent chest x-ray results per Dr.Pabon. Patient verbalized understanding. Patient states he wanted to give it another week and he would contact our office if he wanted to come in for an appointment.

## 2019-09-23 NOTE — Telephone Encounter (Signed)
-----   Message from Jules Husbands, MD sent at 09/23/2019  9:01 AM EDT ----- Please let him know cxr looks good. May f/u virtual or prn . His choice ----- Message ----- From: Interface, Rad Results In Sent: 09/22/2019  12:16 PM EDT To: Jules Husbands, MD

## 2019-11-05 ENCOUNTER — Ambulatory Visit (INDEPENDENT_AMBULATORY_CARE_PROVIDER_SITE_OTHER): Payer: Medicare Other | Admitting: Family Medicine

## 2019-11-05 ENCOUNTER — Other Ambulatory Visit: Payer: Self-pay

## 2019-11-05 ENCOUNTER — Encounter: Payer: Self-pay | Admitting: Family Medicine

## 2019-11-05 DIAGNOSIS — Z7185 Encounter for immunization safety counseling: Secondary | ICD-10-CM

## 2019-11-05 DIAGNOSIS — Z7189 Other specified counseling: Secondary | ICD-10-CM | POA: Diagnosis not present

## 2019-11-05 NOTE — Progress Notes (Signed)
This visit occurred during the SARS-CoV-2 public health emergency.  Safety protocols were in place, including screening questions prior to the visit, additional usage of staff PPE, and extensive cleaning of exam room while observing appropriate contact time as indicated for disinfecting solutions.  He had diaphragm repair.  D/w pt.  He is basically back to work at baseline but is limiting heavy lifting on principle.  Discussed.  He feels well otherwise.  covid vaccine d/w pt.  He had covid illness in 05/2019.  He recovered.  We talked about the rationale for vaccination.  His wife had apneic episode and needed ER eval 6 days ago.  D/w pt.  He thought she was choking initially but she was then able to get her breath.  He is helping his wife care for her father.    I found out today that he witnessed his sister's death.  He was 15, she was 13.  I offered my profound condolences.    Meds, vitals, and allergies reviewed.   ROS: Per HPI unless specifically indicated in ROS section   nad ncat rrr ctab Skin well-healed at incision sites from previous surgery.

## 2019-11-05 NOTE — Patient Instructions (Addendum)
Update me as needed.  Thanks for your effort.  Take care.  Glad to see you. 

## 2019-11-09 DIAGNOSIS — Z7185 Encounter for immunization safety counseling: Secondary | ICD-10-CM | POA: Insufficient documentation

## 2019-11-09 NOTE — Assessment & Plan Note (Signed)
At least 20 minutes were devoted to patient care in this encounter (this can potentially include time spent reviewing the patient's file/history, interviewing and examining the patient, counseling/reviewing plan with patient, ordering referrals, ordering tests, reviewing relevant laboratory or x-ray data, and documenting the encounter).   Discussed rationale for Covid vaccine and answered his questions based on current available evidence to the best of my ability.  It would make sense based on current guidelines for him to get vaccinated.  Discussed.  He will update me as needed.  As a separate issue I asked him to check with his wife to see if she was getting evaluated for possible laryngospasm as that could have theoretically been what was going on with her episode though I cannot say for certain.

## 2019-12-21 ENCOUNTER — Telehealth: Payer: Self-pay | Admitting: *Deleted

## 2019-12-21 NOTE — Telephone Encounter (Signed)
I think it makes sense to get tested for Covid in the meantime.  We do not have any tests at the office.  The best way I know to do this currently is through one of the local pharmacies.  I think it makes sense to get him tested today and then set him up for a virtual appointment in the near future.

## 2019-12-21 NOTE — Telephone Encounter (Signed)
Patient advised.

## 2019-12-21 NOTE — Telephone Encounter (Signed)
Patient called stating that he had his second covid vaccine 8 days ago. Patient stated that he has a sore throat, head and chest congestion that is green. Patient stated that he has been taking OTC cold medications that are not helping. Patient requested that Dr. Damita Dunnings send him in an antibiotic. Advise patient that unfortunately the doctors usually will not call in an antibiotic and with his symptoms we can not bring him into the office. Advised patient if he feels that he needs an antibiotic he should go to an Urgent Care. Patient stated that he is not going to an UC and requested that this message go to Dr. Damita Dunnings. [facility] [employer]

## 2020-01-27 ENCOUNTER — Ambulatory Visit: Payer: Medicare Other | Admitting: Surgery

## 2020-01-27 ENCOUNTER — Encounter: Payer: Self-pay | Admitting: Surgery

## 2020-01-27 ENCOUNTER — Other Ambulatory Visit: Payer: Self-pay

## 2020-01-27 ENCOUNTER — Telehealth: Payer: Self-pay | Admitting: Surgery

## 2020-01-27 VITALS — BP 163/94 | HR 60 | Temp 98.3°F | Resp 12 | Ht 75.0 in | Wt 207.0 lb

## 2020-01-27 DIAGNOSIS — K432 Incisional hernia without obstruction or gangrene: Secondary | ICD-10-CM

## 2020-01-27 NOTE — Progress Notes (Signed)
Outpatient Surgical Follow Up  01/27/2020  Stephen Hanna is an 64 y.o. male.   Chief Complaint  Patient presents with   Follow-up    knot near incision from hernia repair 08/2019    HPI: There is a 64 year old male well-known to me with a prior history of robotic Morgagni's hernia repair hand-assisted.  He did very well more recently reports a bulge on his abdominal wall.  Some abdominal discomfort especially when doing Valsalva.  No fevers no chills no evidence of obstruction.  No current imaging studies.  He is otherwise doing very well no evidence of any pulmonary issues.  This week his father-in-law died.  Past Medical History:  Diagnosis Date   Arthritis    LEFT SHOULDER   Back pain, chronic    after fall from ladder in 1980s with episodic flares   BPH (benign prostatic hyperplasia)    Diverticulitis 10/2010   divertics one small polyp (Dr. Fuller Plan)   GERD (gastroesophageal reflux disease)    RARE   History of kidney stones    Hyperglycemia    Hyperlipidemia    Hypertension    H/O   IBS (irritable bowel syndrome)    diarrhea predominant   Personal history of kidney stones 02/07/2005   CT ABD/pelvis --Diverticulitis sig left renal stone // CT ABD w/o negative stones + multiple aortic L.N. 07/24/2004//    Past Surgical History:  Procedure Laterality Date   CHOLECYSTECTOMY     LAPAROSCOPIC CHOLECYSTECTOMY W/ CHOLANGIOGRAPHY  12/27/2008   Ct abd/pelvis w/o no gallstones mild dilation entire right ureter lap choley (Dr. Bary Castilla)   VASECTOMY  1990   XI ROBOTIC ASSISTED PARAESOPHAGEAL HERNIA REPAIR N/A 09/03/2019   Procedure: XI ROBOTIC ASSISTED Diaphragmatic hernia REPAIR;  Surgeon: Jules Husbands, MD;  Location: [facility] ORS;  Service: General;  Laterality: N/A;    Family History  Problem Relation Age of Onset   Heart disease Father 58       MI   Kidney disease Father    Depression Maternal Grandmother    Alcohol abuse Neg Hx    Stroke Neg Hx     Prostate cancer Neg Hx    Colon cancer Neg Hx     Social History:  reports that he quit smoking about 5 years ago. His smoking use included cigarettes. He has a 30.00 pack-year smoking history. He has never used smokeless tobacco. He reports current alcohol use of about 1.0 standard drink of alcohol per week. He reports that he does not use drugs.  Allergies:  Allergies  Allergen Reactions   Atorvastatin     Aches at 30mg  but not 20mg  a day.    Chantix [Varenicline]     Abnormal dreams   Hydrocodone-Acetaminophen Other (See Comments)    edgy, intolerant   Lisinopril Other (See Comments)    cough    Medications reviewed.    ROS Full ROS performed and is otherwise negative other than what is stated in HPI   BP (!) 163/94    Pulse 60    Temp 98.3 F (36.8 C) (Oral)    Resp 12    Ht 6\' 3"  (1.905 m)    Wt 207 lb (93.9 kg)    SpO2 96%    BMI 25.87 kg/m   Physical Exam Vitals and nursing note reviewed. Exam conducted with a chaperone present.  Constitutional:      Appearance: Normal appearance.  Eyes:     General: No scleral icterus.  Right eye: No discharge.        Left eye: No discharge.  Cardiovascular:     Rate and Rhythm: Normal rate and regular rhythm.  Pulmonary:     Effort: Pulmonary effort is normal. No respiratory distress.     Breath sounds: Normal breath sounds. No stridor.  Abdominal:     General: Abdomen is flat. There is no distension.     Palpations: Abdomen is soft. There is no mass.     Hernia: A hernia is present.     Comments: Evidence of incisional hernia next to the midline supraumbilical incision.  Mild tenderness palpation no peritonitis  Musculoskeletal:        General: No swelling or tenderness. Normal range of motion.     Cervical back: Normal range of motion.  Skin:    General: Skin is warm and dry.     Capillary Refill: Capillary refill takes less than 2 seconds.  Neurological:     General: No focal deficit present.     Mental  Status: He is alert and oriented to person, place, and time.  Psychiatric:        Mood and Affect: Mood normal.        Behavior: Behavior normal.        Thought Content: Thought content normal.        Judgment: Judgment normal.     Assessment/Plan: 64 year old male with incisional hernia from prior diaphragmatic hernia repair.  This is a small but is causing some symptoms.  Discussed with patient in detail about different options over.  I do think that he is better served with a robotic approach.  I also cussed with him the potential role of preoperative CT scan although this would not change the management it would be good to have as a roadmap.  Patient wishes to get this taken care of as soon as possible and wished to proceed to the operating room.  Procedure discussed with the patient in detail.  Risks, benefits and possible applications including but not limited to: Bleeding, infection injury to adjacent structures recurrence.  He understands and wished to proceed   Greater than 50% of the 40 minutes  visit was spent in counseling/coordination of care   Caroleen Hamman, MD [employer] Surgeon

## 2020-01-27 NOTE — Patient Instructions (Addendum)
Our surgery scheduler will call you within 24-48 hours to schedule your surgery. Please have the [facility] surgery sheet available when speaking with her.   Hernia, Adult   A hernia happens when tissue inside your body pushes out through a weak spot in your belly muscles (abdominal wall). This makes a round lump (bulge). The lump may be:  In a scar from surgery that was done in your belly (incisional hernia).  Near your belly button (umbilical hernia).  In your groin (inguinal hernia). Your groin is the area where your leg meets your lower belly (abdomen). This kind of hernia could also be: ? In your scrotum, if you are male. ? In folds of skin around your vagina, if you are male.  In your upper thigh (femoral hernia).  Inside your belly (hiatal hernia). This happens when your stomach slides above the muscle between your belly and your chest (diaphragm). If your hernia is small and it does not cause pain, you may not need treatment. If your hernia is large or it causes pain, you may need surgery. Follow these instructions at home: Activity  Avoid stretching or overusing (straining) the muscles near your hernia. Straining can happen when you: ? Lift something heavy. ? Poop (have a bowel movement).  Do not lift anything that is heavier than 10 lb (4.5 kg), or the limit that you are told, until your doctor says that it is safe.  Use the strength of your legs when you lift something heavy. Do not use only your back muscles to lift. General instructions  Do these things if told by your doctor so you do not have trouble pooping (constipation): ? Drink enough fluid to keep your pee (urine) pale yellow. ? Eat foods that are high in fiber. These include fresh fruits and vegetables, whole grains, and beans. ? Limit foods that are high in fat and processed sugars. These include foods that are fried or sweet. ? Take medicine for trouble pooping.  When you cough, try to cough gently.  You may  try to push your hernia in by very gently pressing on it when you are lying down. Do not try to force the bulge back in if it will not push in easily.  If you are overweight, work with your doctor to lose weight safely.  Do not use any products that have nicotine or tobacco in them. These include cigarettes and e-cigarettes. If you need help quitting, ask your doctor.  If you will be having surgery (hernia repair), watch your hernia for changes in shape, size, or color. Tell your doctor if you see any changes.  Take over-the-counter and prescription medicines only as told by your doctor.  Keep all follow-up visits as told by your doctor. Contact a doctor if:  You get new pain, swelling, or redness near your hernia.  You poop fewer times in a week than normal.  You have trouble pooping.  You have poop (stool) that is more dry than normal.  You have poop that is harder or larger than normal. Get help right away if:  You have a fever.  You have belly pain that gets worse.  You feel sick to your stomach (nauseous).  You throw up (vomit).  Your hernia cannot be pushed in by very gently pressing on it when you are lying down. Do not try to force the bulge back in if it will not push in easily.  Your hernia: ? Changes in shape or size. ? Changes  color. ? Feels hard or it hurts when you touch it. These symptoms may represent a serious problem that is an emergency. Do not wait to see if the symptoms will go away. Get medical help right away. Call your local emergency services (911 in the [place] Summary  A hernia happens when tissue inside your body pushes out through a weak spot in the belly muscles. This creates a bulge.  If your hernia is small and it does not hurt, you may not need treatment. If your hernia is large or it hurts, you may need surgery.  If you will be having surgery, watch your hernia for changes in shape, size, or color. Tell your doctor about any changes. This  information is not intended to replace advice given to you by your health care provider. Make sure you discuss any questions you have with your health care provider. Document Revised: 08/14/2018 Document Reviewed: 01/23/2017 Elsevier Patient Education  [place].

## 2020-01-27 NOTE — Telephone Encounter (Signed)
Pt has been advised of Pre-Admission date/time, COVID Testing date and Surgery date.  Surgery Date: 02/04/20 Preadmission Testing Date: 01/28/20 (phone 8a-1p) Covid Testing Date: 02/02/20 - patient advised to go to the [facility] ([place] between 8a-1p  Patient has been made aware to call [phone], between 1-3:00pm the day before surgery, to find out what time to arrive for surgery.

## 2020-01-27 NOTE — H&P (View-Only) (Signed)
Outpatient Surgical Follow Up  01/27/2020  Stephen Hanna is an 64 y.o. male.   Chief Complaint  Patient presents with  . Follow-up    knot near incision from hernia repair 08/2019    HPI: There is a 64 year old male well-known to me with a prior history of robotic Morgagni's hernia repair hand-assisted.  He did very well more recently reports a bulge on his abdominal wall.  Some abdominal discomfort especially when doing Valsalva.  No fevers no chills no evidence of obstruction.  No current imaging studies.  He is otherwise doing very well no evidence of any pulmonary issues.  This week his father-in-law died.  Past Medical History:  Diagnosis Date  . Arthritis    LEFT SHOULDER  . Back pain, chronic    after fall from ladder in 1980s with episodic flares  . BPH (benign prostatic hyperplasia)   . Diverticulitis 10/2010   divertics one small polyp (Dr. Fuller Plan)  . GERD (gastroesophageal reflux disease)    RARE  . History of kidney stones   . Hyperglycemia   . Hyperlipidemia   . Hypertension    H/O  . IBS (irritable bowel syndrome)    diarrhea predominant  . Personal history of kidney stones 02/07/2005   CT ABD/pelvis --Diverticulitis sig left renal stone // CT ABD w/o negative stones + multiple aortic L.N. 07/24/2004//    Past Surgical History:  Procedure Laterality Date  . CHOLECYSTECTOMY    . LAPAROSCOPIC CHOLECYSTECTOMY W/ CHOLANGIOGRAPHY  12/27/2008   Ct abd/pelvis w/o no gallstones mild dilation entire right ureter lap choley (Dr. Bary Castilla)  . VASECTOMY  1990  . XI ROBOTIC ASSISTED PARAESOPHAGEAL HERNIA REPAIR N/A 09/03/2019   Procedure: XI ROBOTIC ASSISTED Diaphragmatic hernia REPAIR;  Surgeon: Jules Husbands, MD;  Location: [facility] ORS;  Service: General;  Laterality: N/A;    Family History  Problem Relation Age of Onset  . Heart disease Father 40       MI  . Kidney disease Father   . Depression Maternal Grandmother   . Alcohol abuse Neg Hx   . Stroke Neg Hx   .  Prostate cancer Neg Hx   . Colon cancer Neg Hx     Social History:  reports that he quit smoking about 5 years ago. His smoking use included cigarettes. He has a 30.00 pack-year smoking history. He has never used smokeless tobacco. He reports current alcohol use of about 1.0 standard drink of alcohol per week. He reports that he does not use drugs.  Allergies:  Allergies  Allergen Reactions  . Atorvastatin     Aches at 30mg  but not 20mg  a day.   . Chantix [Varenicline]     Abnormal dreams  . Hydrocodone-Acetaminophen Other (See Comments)    edgy, intolerant  . Lisinopril Other (See Comments)    cough    Medications reviewed.    ROS Full ROS performed and is otherwise negative other than what is stated in HPI   BP (!) 163/94   Pulse 60   Temp 98.3 F (36.8 C) (Oral)   Resp 12   Ht 6\' 3"  (1.905 m)   Wt 207 lb (93.9 kg)   SpO2 96%   BMI 25.87 kg/m   Physical Exam Vitals and nursing note reviewed. Exam conducted with a chaperone present.  Constitutional:      Appearance: Normal appearance.  Eyes:     General: No scleral icterus.       Right eye: No discharge.  Left eye: No discharge.  Cardiovascular:     Rate and Rhythm: Normal rate and regular rhythm.  Pulmonary:     Effort: Pulmonary effort is normal. No respiratory distress.     Breath sounds: Normal breath sounds. No stridor.  Abdominal:     General: Abdomen is flat. There is no distension.     Palpations: Abdomen is soft. There is no mass.     Hernia: A hernia is present.     Comments: Evidence of incisional hernia next to the midline supraumbilical incision.  Mild tenderness palpation no peritonitis  Musculoskeletal:        General: No swelling or tenderness. Normal range of motion.     Cervical back: Normal range of motion.  Skin:    General: Skin is warm and dry.     Capillary Refill: Capillary refill takes less than 2 seconds.  Neurological:     General: No focal deficit present.     Mental  Status: He is alert and oriented to person, place, and time.  Psychiatric:        Mood and Affect: Mood normal.        Behavior: Behavior normal.        Thought Content: Thought content normal.        Judgment: Judgment normal.     Assessment/Plan: 64 year old male with incisional hernia from prior diaphragmatic hernia repair.  This is a small but is causing some symptoms.  Discussed with patient in detail about different options over.  I do think that he is better served with a robotic approach.  I also cussed with him the potential role of preoperative CT scan although this would not change the management it would be good to have as a roadmap.  Patient wishes to get this taken care of as soon as possible and wished to proceed to the operating room.  Procedure discussed with the patient in detail.  Risks, benefits and possible applications including but not limited to: Bleeding, infection injury to adjacent structures recurrence.  He understands and wished to proceed   Greater than 50% of the 40 minutes  visit was spent in counseling/coordination of care   Caroleen Hamman, MD [place] Surgeon

## 2020-01-28 ENCOUNTER — Encounter
Admission: RE | Admit: 2020-01-28 | Discharge: 2020-01-28 | Disposition: A | Discharge status: Home / Self Care | Payer: Medicare Other | Source: Ambulatory Visit | Attending: Surgery | Admitting: Surgery

## 2020-01-28 ENCOUNTER — Other Ambulatory Visit: Payer: Self-pay

## 2020-01-28 DIAGNOSIS — Z01818 Encounter for other preprocedural examination: Secondary | ICD-10-CM | POA: Insufficient documentation

## 2020-01-28 NOTE — Patient Instructions (Signed)
Your procedure is scheduled on: Thursday 02/04/20.  Report to DAY SURGERY DEPARTMENT LOCATED ON 2ND FLOOR MEDICAL MALL ENTRANCE. To find out your arrival time please call [phone] between 1PM - 3PM on Wednesday 02/03/20.   Remember: Instructions that are not followed completely may result in serious medical risk, up to and including death, or upon the discretion of your surgeon and anesthesiologist your surgery may need to be rescheduled.     __X__ 1. Do not eat food after midnight the night before your procedure.                 No gum chewing or hard candies. You may drink clear liquids up to 2 hours                 before you are scheduled to arrive for your surgery- DO NOT drink clear                 liquids within 2 hours of the start of your surgery.                 Clear Liquids include:  water, apple juice without pulp, clear carbohydrate                 drink such as Clearfast or Gatorade, Black Coffee or Tea (Do not add                 milk or creamer to coffee or tea).  __X__2.  On the morning of surgery brush your teeth with toothpaste and water, you may rinse your mouth with mouthwash if you wish.  Do not swallow any toothpaste or mouthwash.    __X__ 3.  No Alcohol for 24 hours before or after surgery.  __X__ 4.  Do Not Smoke or use e-cigarettes For 24 Hours Prior to Your Surgery.                 Do not use any chewable tobacco products for at least 6 hours prior to                 surgery.  __X__5.  Notify your doctor if there is any change in your medical condition      (cold, fever, infections).      Do NOT wear jewelry, make-up, hairpins, clips or nail polish. Do NOT wear lotions, powders, or perfumes.  Do NOT shave 48 hours prior to surgery. Men may shave face and neck. Do NOT bring valuables to the hospital.     [facility] is not responsible for any belongings or valuables.   Contacts, dentures/partials or body piercings may not be worn into surgery.  Bring a case for your contacts, glasses or hearing aids, a denture cup will be supplied. Leave your suitcase in the car. After surgery it may be brought to your room.   For patients admitted to the hospital, discharge time is determined by your treatment team.    Patients discharged the day of surgery will not be allowed to drive home.     __X__ Take these medicines the morning of surgery with A SIP OF WATER:     1. atorvastatin (LIPITOR)    __X__ Use CHG Soap as directed.  __X__ Stop Anti-inflammatories 7 days before surgery such as Advil, Ibuprofen, Motrin, BC or Goodies Powder, Naprosyn, Naproxen, Aleve, Aspirin, Meloxicam. May take Tylenol if needed for pain or discomfort.   __X__Do not start taking any new herbal supplements or  vitamins prior to your procedure.  __X__ Stop the following herbal supplements or vitamins:  ascorbic acid (VITAMIN C)      Wear comfortable clothing (specific to your surgery type) to the hospital.  Plan for stool softeners for home use; pain medications have a tendency to cause constipation. You can also help prevent constipation by eating foods high in fiber such as fruits and vegetables and drinking plenty of fluids as your diet allows.  After surgery, you can prevent lung complications by doing breathing exercises.Take deep breaths and cough every 1-2 hours. Your doctor may order a device called an Incentive Spirometer to help you take deep breaths.  Please call the [place] Department at [phone] if you have any questions about these instructions.

## 2020-02-01 ENCOUNTER — Other Ambulatory Visit: Payer: Medicare Other

## 2020-02-02 ENCOUNTER — Other Ambulatory Visit: Payer: Self-pay

## 2020-02-02 ENCOUNTER — Encounter
Admission: RE | Admit: 2020-02-02 | Discharge: 2020-02-02 | Disposition: A | Discharge status: Home / Self Care | Payer: Medicare Other | Source: Ambulatory Visit | Attending: Surgery | Admitting: Surgery

## 2020-02-02 DIAGNOSIS — K660 Peritoneal adhesions (postprocedural) (postinfection): Secondary | ICD-10-CM | POA: Diagnosis not present

## 2020-02-02 DIAGNOSIS — K432 Incisional hernia without obstruction or gangrene: Secondary | ICD-10-CM | POA: Diagnosis not present

## 2020-02-02 DIAGNOSIS — Z20822 Contact with and (suspected) exposure to covid-19: Secondary | ICD-10-CM | POA: Insufficient documentation

## 2020-02-02 DIAGNOSIS — Z01818 Encounter for other preprocedural examination: Secondary | ICD-10-CM | POA: Insufficient documentation

## 2020-02-02 DIAGNOSIS — Z87891 Personal history of nicotine dependence: Secondary | ICD-10-CM | POA: Diagnosis not present

## 2020-02-02 DIAGNOSIS — Z01810 Encounter for preprocedural cardiovascular examination: Secondary | ICD-10-CM | POA: Diagnosis not present

## 2020-02-02 DIAGNOSIS — I10 Essential (primary) hypertension: Secondary | ICD-10-CM | POA: Diagnosis not present

## 2020-02-02 LAB — CBC
HCT: 42.9 % (ref 39.0–52.0)
Hemoglobin: 14.8 g/dL (ref 13.0–17.0)
MCH: 30.4 pg (ref 26.0–34.0)
MCHC: 34.5 g/dL (ref 30.0–36.0)
MCV: 88.1 fL (ref 80.0–100.0)
Platelets: 224 10*3/uL (ref 150–400)
RBC: 4.87 MIL/uL (ref 4.22–5.81)
RDW: 14.6 % (ref 11.5–15.5)
WBC: 9 10*3/uL (ref 4.0–10.5)
nRBC: 0 % (ref 0.0–0.2)

## 2020-02-02 LAB — BASIC METABOLIC PANEL
Anion gap: 7 (ref 5–15)
BUN: 20 mg/dL (ref 8–23)
CO2: 25 mmol/L (ref 22–32)
Calcium: 8.7 mg/dL — ABNORMAL LOW (ref 8.9–10.3)
Chloride: 108 mmol/L (ref 98–111)
Creatinine, Ser: 0.84 mg/dL (ref 0.61–1.24)
GFR calc Af Amer: >60 mL/min (ref >60)
GFR calc non Af Amer: >60 mL/min (ref >60)
Glucose, Bld: 103 mg/dL — ABNORMAL HIGH (ref 70–99)
Potassium: 3.9 mmol/L (ref 3.5–5.1)
Sodium: 140 mmol/L (ref 135–145)

## 2020-02-02 LAB — SARS CORONAVIRUS 2 (TAT 6-24 HRS): SARS Coronavirus 2: NEGATIVE

## 2020-02-04 ENCOUNTER — Ambulatory Visit: Payer: Medicare Other | Admitting: Urgent Care

## 2020-02-04 ENCOUNTER — Other Ambulatory Visit: Payer: Self-pay

## 2020-02-04 ENCOUNTER — Encounter
Admission: RE | Disposition: A | Discharge status: Home / Self Care | Payer: Self-pay | Source: Home / Self Care | Attending: Surgery

## 2020-02-04 ENCOUNTER — Encounter: Payer: Self-pay | Admitting: Surgery

## 2020-02-04 ENCOUNTER — Ambulatory Visit
Admission: RE | Admit: 2020-02-04 | Discharge: 2020-02-04 | Disposition: A | Discharge status: Home / Self Care | Payer: Medicare Other | Attending: Surgery | Admitting: Surgery

## 2020-02-04 DIAGNOSIS — Z87891 Personal history of nicotine dependence: Secondary | ICD-10-CM | POA: Insufficient documentation

## 2020-02-04 DIAGNOSIS — K432 Incisional hernia without obstruction or gangrene: Secondary | ICD-10-CM | POA: Insufficient documentation

## 2020-02-04 DIAGNOSIS — K660 Peritoneal adhesions (postprocedural) (postinfection): Secondary | ICD-10-CM | POA: Diagnosis not present

## 2020-02-04 DIAGNOSIS — I10 Essential (primary) hypertension: Secondary | ICD-10-CM | POA: Insufficient documentation

## 2020-02-04 HISTORY — PX: XI ROBOTIC ASSISTED VENTRAL HERNIA: SHX6789

## 2020-02-04 SURGERY — REPAIR, HERNIA, VENTRAL, ROBOT-ASSISTED
Anesthesia: General

## 2020-02-04 MED ORDER — ONDANSETRON HCL 4 MG/2ML IJ SOLN
INTRAMUSCULAR | Status: DC | PRN
  Administered 2020-02-04: 4 mg via INTRAVENOUS

## 2020-02-04 MED ORDER — CHLORHEXIDINE GLUCONATE CLOTH 2 % EX PADS: 6.0000 | MEDICATED_PAD | Freq: Once | CUTANEOUS | Status: DC

## 2020-02-04 MED ORDER — CELECOXIB 200 MG PO CAPS
ORAL_CAPSULE | ORAL | Status: AC
  Administered 2020-02-04: 200 mg via ORAL
  Filled 2020-02-04: qty 1

## 2020-02-04 MED ORDER — GABAPENTIN 300 MG PO CAPS
ORAL_CAPSULE | ORAL | Status: AC
  Administered 2020-02-04: 300 mg via ORAL
  Filled 2020-02-04: qty 1

## 2020-02-04 MED ORDER — FENTANYL CITRATE (PF) 100 MCG/2ML IJ SOLN
INTRAMUSCULAR | Status: AC
Start: 2020-02-04 — End: 2020-02-04
  Administered 2020-02-04: 50 ug via INTRAVENOUS
  Filled 2020-02-04: qty 2

## 2020-02-04 MED ORDER — LIDOCAINE HCL (PF) 2 % IJ SOLN: INTRAMUSCULAR | Status: DC | PRN

## 2020-02-04 MED ORDER — ORAL CARE MOUTH RINSE: 15.0000 mL | Freq: Once | OROMUCOSAL | Status: AC

## 2020-02-04 MED ORDER — FAMOTIDINE 20 MG PO TABS
ORAL_TABLET | ORAL | Status: AC
  Administered 2020-02-04: 20 mg via ORAL
  Filled 2020-02-04: qty 1

## 2020-02-04 MED ORDER — CEFAZOLIN SODIUM-DEXTROSE 2-4 GM/100ML-% IV SOLN
2.0000 g | INTRAVENOUS | Status: AC
  Administered 2020-02-04: 2 g via INTRAVENOUS

## 2020-02-04 MED ORDER — CEFAZOLIN SODIUM-DEXTROSE 2-4 GM/100ML-% IV SOLN
INTRAVENOUS | Status: AC
  Filled 2020-02-04: qty 100

## 2020-02-04 MED ORDER — CHLORHEXIDINE GLUCONATE 0.12 % MT SOLN
OROMUCOSAL | Status: AC
  Administered 2020-02-04: 15 mL via OROMUCOSAL
  Filled 2020-02-04: qty 15

## 2020-02-04 MED ORDER — ACETAMINOPHEN 500 MG PO TABS
ORAL_TABLET | ORAL | Status: AC
  Administered 2020-02-04: 1000 mg via ORAL
  Filled 2020-02-04: qty 2

## 2020-02-04 MED ORDER — SUGAMMADEX SODIUM 200 MG/2ML IV SOLN
INTRAVENOUS | Status: DC | PRN
  Administered 2020-02-04: 200 mg via INTRAVENOUS

## 2020-02-04 MED ORDER — CHLORHEXIDINE GLUCONATE 0.12 % MT SOLN: 15.0000 mL | Freq: Once | OROMUCOSAL | Status: AC

## 2020-02-04 MED ORDER — FENTANYL CITRATE (PF) 100 MCG/2ML IJ SOLN
25.0000 ug | INTRAMUSCULAR | Status: DC | PRN
  Administered 2020-02-04 (×2): 50 ug via INTRAVENOUS

## 2020-02-04 MED ORDER — FENTANYL CITRATE (PF) 100 MCG/2ML IJ SOLN
INTRAMUSCULAR | Status: DC | PRN
Start: 2020-02-04 — End: 2020-02-04
  Administered 2020-02-04 (×3): 50 ug via INTRAVENOUS
  Administered 2020-02-04: 100 ug via INTRAVENOUS

## 2020-02-04 MED ORDER — OXYCODONE-ACETAMINOPHEN 5-325 MG PO TABS: 1.0000 | ORAL_TABLET | ORAL | 0 refills | Status: DC | PRN

## 2020-02-04 MED ORDER — FENTANYL CITRATE (PF) 100 MCG/2ML IJ SOLN
INTRAMUSCULAR | Status: DC
Start: 2020-02-04 — End: 2020-02-04
  Filled 2020-02-04: qty 2

## 2020-02-04 MED ORDER — PROPOFOL 10 MG/ML IV BOLUS
INTRAVENOUS | Status: DC | PRN
  Administered 2020-02-04: 170 mg via INTRAVENOUS

## 2020-02-04 MED ORDER — DEXAMETHASONE SODIUM PHOSPHATE 10 MG/ML IJ SOLN
INTRAMUSCULAR | Status: DC | PRN
  Administered 2020-02-04: 5 mg via INTRAVENOUS

## 2020-02-04 MED ORDER — BUPIVACAINE LIPOSOME 1.3 % IJ SUSP
INTRAMUSCULAR | Status: AC
  Filled 2020-02-04: qty 20

## 2020-02-04 MED ORDER — EPHEDRINE SULFATE 50 MG/ML IJ SOLN
INTRAMUSCULAR | Status: DC | PRN
  Administered 2020-02-04: 7.5 mg via INTRAVENOUS

## 2020-02-04 MED ORDER — BUPIVACAINE LIPOSOME 1.3 % IJ SUSP
INTRAMUSCULAR | Status: DC | PRN
  Administered 2020-02-04: 20 mL

## 2020-02-04 MED ORDER — PROPOFOL 10 MG/ML IV BOLUS
INTRAVENOUS | Status: AC
  Filled 2020-02-04: qty 20

## 2020-02-04 MED ORDER — ACETAMINOPHEN 500 MG PO TABS: 1000.0000 mg | ORAL_TABLET | ORAL | Status: AC

## 2020-02-04 MED ORDER — GLYCOPYRROLATE 0.2 MG/ML IJ SOLN
INTRAMUSCULAR | Status: DC | PRN
  Administered 2020-02-04: .2 mg via INTRAVENOUS

## 2020-02-04 MED ORDER — GABAPENTIN 300 MG PO CAPS: 300.0000 mg | ORAL_CAPSULE | ORAL | Status: AC

## 2020-02-04 MED ORDER — CELECOXIB 200 MG PO CAPS: 200.0000 mg | ORAL_CAPSULE | ORAL | Status: AC

## 2020-02-04 MED ORDER — ROCURONIUM BROMIDE 100 MG/10ML IV SOLN
INTRAVENOUS | Status: DC | PRN
  Administered 2020-02-04: 50 mg via INTRAVENOUS
  Administered 2020-02-04: 20 mg via INTRAVENOUS

## 2020-02-04 MED ORDER — LIDOCAINE HCL (PF) 2 % IJ SOLN
INTRAMUSCULAR | Status: DC | PRN
  Administered 2020-02-04: 1.5 mg/kg/h via INTRADERMAL

## 2020-02-04 MED ORDER — KETOROLAC TROMETHAMINE 30 MG/ML IJ SOLN
INTRAMUSCULAR | Status: AC
  Administered 2020-02-04: 30 mg via INTRAVENOUS
  Filled 2020-02-04: qty 1

## 2020-02-04 MED ORDER — KETOROLAC TROMETHAMINE 30 MG/ML IJ SOLN: 30.0000 mg | Freq: Once | INTRAMUSCULAR | Status: AC

## 2020-02-04 MED ORDER — ONDANSETRON HCL 4 MG/2ML IJ SOLN: 4.0000 mg | Freq: Once | INTRAMUSCULAR | Status: DC | PRN

## 2020-02-04 MED ORDER — OXYCODONE-ACETAMINOPHEN 5-325 MG PO TABS
ORAL_TABLET | ORAL | Status: AC
Start: 2020-02-04 — End: 2020-02-04
  Administered 2020-02-04: 2 via ORAL
  Filled 2020-02-04: qty 2

## 2020-02-04 MED ORDER — LACTATED RINGERS IV SOLN: INTRAVENOUS | Status: DC

## 2020-02-04 MED ORDER — PREGABALIN 100 MG PO CAPS
100.0000 mg | ORAL_CAPSULE | Freq: Once | ORAL | Status: AC
  Administered 2020-02-04: 100 mg via ORAL
  Filled 2020-02-04: qty 1

## 2020-02-04 MED ORDER — OXYCODONE-ACETAMINOPHEN 5-325 MG PO TABS: 1.0000 | ORAL_TABLET | Freq: Once | ORAL | Status: AC

## 2020-02-04 MED ORDER — FAMOTIDINE 20 MG PO TABS: 20.0000 mg | ORAL_TABLET | Freq: Once | ORAL | Status: AC

## 2020-02-04 MED ORDER — FENTANYL CITRATE (PF) 250 MCG/5ML IJ SOLN
INTRAMUSCULAR | Status: AC
  Filled 2020-02-04: qty 5

## 2020-02-04 MED ORDER — LIDOCAINE HCL (CARDIAC) PF 100 MG/5ML IV SOSY
PREFILLED_SYRINGE | INTRAVENOUS | Status: DC | PRN
  Administered 2020-02-04: 100 mg via INTRAVENOUS

## 2020-02-04 MED ORDER — BUPIVACAINE-EPINEPHRINE (PF) 0.25% -1:200000 IJ SOLN
INTRAMUSCULAR | Status: AC
  Filled 2020-02-04: qty 30

## 2020-02-04 SURGICAL SUPPLY — 53 items
ADH SKN CLS APL DERMABOND .7 (GAUZE/BANDAGES/DRESSINGS) ×1
APL PRP STRL LF DISP 70% ISPRP (MISCELLANEOUS) ×1
CANISTER SUCT 1200ML W/VALVE (MISCELLANEOUS) ×2 IMPLANT
CANNULA REDUC XI 12-8 STAPL (CANNULA) ×2
CANNULA REDUCER 12-8 DVNC XI (CANNULA) ×1 IMPLANT
CHLORAPREP W/TINT 26 (MISCELLANEOUS) ×2 IMPLANT
COVER TIP SHEARS 8 DVNC (MISCELLANEOUS) ×1 IMPLANT
COVER TIP SHEARS 8MM DA VINCI (MISCELLANEOUS) ×2
COVER WAND RF STERILE (DRAPES) ×2 IMPLANT
DEFOGGER SCOPE WARMER CLEARIFY (MISCELLANEOUS) ×2 IMPLANT
DERMABOND ADVANCED (GAUZE/BANDAGES/DRESSINGS) ×1
DERMABOND ADVANCED .7 DNX12 (GAUZE/BANDAGES/DRESSINGS) ×1 IMPLANT
DRAPE 3/4 80X56 (DRAPES) ×2 IMPLANT
DRAPE ARM DVNC X/XI (DISPOSABLE) ×4 IMPLANT
DRAPE COLUMN DVNC XI (DISPOSABLE) ×1 IMPLANT
DRAPE DA VINCI XI ARM (DISPOSABLE) ×8
DRAPE DA VINCI XI COLUMN (DISPOSABLE) ×2
ELECT CAUTERY BLADE 6.4 (BLADE) ×2 IMPLANT
ELECT REM PT RETURN 9FT ADLT (ELECTROSURGICAL) ×2
ELECTRODE REM PT RTRN 9FT ADLT (ELECTROSURGICAL) ×1 IMPLANT
GLOVE BIO SURGEON STRL SZ7 (GLOVE) ×4 IMPLANT
GOWN STRL REUS W/ TWL LRG LVL3 (GOWN DISPOSABLE) ×3 IMPLANT
GOWN STRL REUS W/TWL LRG LVL3 (GOWN DISPOSABLE) ×6
GRASPER SUT TROCAR 14GX15 (MISCELLANEOUS) ×2 IMPLANT
IRRIGATION STRYKERFLOW (MISCELLANEOUS) IMPLANT
IRRIGATOR STRYKERFLOW (MISCELLANEOUS)
IV NS 1000ML (IV SOLUTION)
IV NS 1000ML BAXH (IV SOLUTION) IMPLANT
KIT PINK PAD W/HEAD ARE REST (MISCELLANEOUS) ×2
KIT PINK PAD W/HEAD ARM REST (MISCELLANEOUS) ×1 IMPLANT
LABEL OR SOLS (LABEL) ×2 IMPLANT
MESH VENT LT ST 10.2X15.2CM EL (Mesh General) ×1 IMPLANT
NEEDLE HYPO 22GX1.5 SAFETY (NEEDLE) ×2 IMPLANT
OBTURATOR OPTICAL STANDARD 8MM (TROCAR) ×2
OBTURATOR OPTICAL STND 8 DVNC (TROCAR) ×1
OBTURATOR OPTICALSTD 8 DVNC (TROCAR) ×1 IMPLANT
PACK LAP CHOLECYSTECTOMY (MISCELLANEOUS) ×2 IMPLANT
PENCIL ELECTRO HAND CTR (MISCELLANEOUS) ×2 IMPLANT
SEAL CANN UNIV 5-8 DVNC XI (MISCELLANEOUS) ×3 IMPLANT
SEAL XI 5MM-8MM UNIVERSAL (MISCELLANEOUS) ×6
SET TUBE SMOKE EVAC HIGH FLOW (TUBING) ×2 IMPLANT
SOLUTION ELECTROLUBE (MISCELLANEOUS) ×2 IMPLANT
SPONGE LAP 18X18 RF (DISPOSABLE) ×2 IMPLANT
STAPLER CANNULA SEAL DVNC XI (STAPLE) ×1 IMPLANT
STAPLER CANNULA SEAL XI (STAPLE) ×2
SUT MNCRL 4-0 (SUTURE) ×2
SUT MNCRL 4-0 27XMFL (SUTURE) ×1
SUT STRATAFIX PDS 30 CT-1 (SUTURE) ×3 IMPLANT
SUT VICRYL 0 AB UR-6 (SUTURE) ×4 IMPLANT
SUT VLOC 90 2/L VL 12 GS22 (SUTURE) ×4 IMPLANT
SUTURE MNCRL 4-0 27XMF (SUTURE) ×1 IMPLANT
TAPE TRANSPORE STRL 2 31045 (GAUZE/BANDAGES/DRESSINGS) ×2 IMPLANT
TROCAR BALLN GELPORT 12X130M (ENDOMECHANICALS) ×2 IMPLANT

## 2020-02-04 NOTE — Progress Notes (Signed)
Pt reporting intense pain while moving. '' I feel like I'm being stabbed, and like I've done 1000 sit ups but if I'm not moving it's not so bad. Notified Dr. Dahlia Byes and reviewed prior pain medications given. Orders received. Patient given medications as ordered. Support and encouragement provided. Wife at bedside.

## 2020-02-04 NOTE — Anesthesia Procedure Notes (Signed)
Procedure Name: Intubation Date/Time: 02/04/2020 10:54 AM Performed by: Chanetta Marshall, CRNA Pre-anesthesia Checklist: Patient identified, Emergency Drugs available, Suction available and Patient being monitored Patient Re-evaluated:Patient Re-evaluated prior to induction Oxygen Delivery Method: Circle system utilized Preoxygenation: Pre-oxygenation with 100% oxygen Induction Type: IV induction Ventilation: Mask ventilation without difficulty Laryngoscope Size: McGraph and 3 Grade View: Grade II Tube type: Oral Tube size: 7.5 mm Number of attempts: 1 Airway Equipment and Method: Stylet and Video-laryngoscopy Placement Confirmation: ETT inserted through vocal cords under direct vision,  positive ETCO2,  breath sounds checked- equal and bilateral and CO2 detector Secured at: 21 cm Tube secured with: Tape Dental Injury: Teeth and Oropharynx as per pre-operative assessment

## 2020-02-04 NOTE — Op Note (Signed)
Robotic assisted laparoscopic incisional hernia Repair IPOM using  10x15cms Oval ventralight BARD mesh   Pre-operative Diagnosis: Incisional hernia   Post-operative Diagnosis: same   Surgeon: Caroleen Hamman, MD FACS   Anesthesia: Gen. with endotracheal tube     Findings: Incisional hernia 4 cms longitudinal defect Adhesions abd wall to omentum Intact prior diaphragmatic repair   Estimated Blood Loss:5 cc         Complications: none             Procedure Details  The patient was seen again in the Holding Room. The benefits, complications, treatment options, and expected outcomes were discussed with the patient. The risks of bleeding, infection, recurrence of symptoms, failure to resolve symptoms, bowel injury, mesh placement, mesh infection, any of which could require further surgery were reviewed with the patient. The likelihood of improving the patient's symptoms with return to their baseline status is good.  The patient and/or family concurred with the proposed plan, giving informed consent.  The patient was taken to Operating Room, identified and the procedure verified.  A Time Out was held and the above information confirmed.   Prior to the induction of general anesthesia, antibiotic prophylaxis was administered. VTE prophylaxis was in place. General endotracheal anesthesia was then administered and tolerated well. After the induction, the abdomen was prepped with Chloraprep and draped in the sterile fashion. The patient was positioned in the supine position.   We used a left upper quadrant subcostal incision and using a cutdown technique were able to identify the fascia both anteriorly and posteriorly elevated incised and 2 stay sutures were placed.  Hassan trocar inserted and pneumoperitoneum obtained.  No hemodynamic compromise.   2 additional 8 mm ports were placed under direct visualization.  I visualized the hernia and there was an epigastric ventral hernia measuring approximately 4  cm.  At this time I went ahead and inserted the  mesh with an echo location.   The robot was brought to the surgical field and docked in the standard fashion.  We made sure that all instrumentation was kept under direct vision at all times and there was no collision between the arms.  I scrubbed out and went to the console. Laparoscopic lysis of adhesions was performed with scissors. Intact prior diaphragmatic repair was intact.   I Confirmed and measured that the defect was 4 cm.  .  Using a 0V stratafix suture we closed the ventral defect primarily.  The PMI was used to pierce the defect in the center.  Using the mesh and the echo location device we were able to bring the mesh towards the abdominal wall. Falciform was taken down with electrocautery to allow adequate mesh placement.   The mesh was secured circumferentially to the abdominal wall using 2 OV lock in the standard fashion.   The mesh layed really nicely against the  abdominal wall. A second look laparoscopy revealed no evidence of intra-abdominal injury.    All the needles and foreign objects were removed under direct visualization.  The instruments were removed and the robot was undocked.  I scrubbed back in, The laparoscopic ports were removed under direct visualization and the pneumoperitoneum was deflated.   Incisions were closed with  4-0 Monocryl  And the fascial sutures approximated in the standard fashion Dermabond was used to coat the skin. Marcaine quarter percent with epinephrine and lidocaine 1% was used to inject all the incision sites. Patient tolerated procedure well and there were no immediate complications. Needle  and laparotomy counts were correct    Caroleen Hamman, MD, FACS

## 2020-02-04 NOTE — Anesthesia Preprocedure Evaluation (Signed)
Anesthesia Evaluation  Patient identified by MRN, date of birth, ID band Patient awake    Reviewed: Allergy & Precautions, NPO status , Patient's Chart, lab work & pertinent test results  History of Anesthesia Complications Negative for: history of anesthetic complications  Airway Mallampati: III       Dental  (+) Dental Advidsory Given, Poor Dentition   Pulmonary neg sleep apnea, neg COPD, Not current smoker, former smoker,           Cardiovascular hypertension, + CAD  (-) Past MI and (-) CHF (-) dysrhythmias (-) Valvular Problems/Murmurs     Neuro/Psych neg Seizures    GI/Hepatic Neg liver ROS, GERD (rare)  ,  Endo/Other  neg diabetes  Renal/GU Renal disease (stones)     Musculoskeletal   Abdominal   Peds  Hematology   Anesthesia Other Findings Past Medical History: No date: Arthritis     Comment:  LEFT SHOULDER No date: Back pain, chronic     Comment:  after fall from ladder in 1980s with episodic flares No date: BPH (benign prostatic hyperplasia) 10/2010: Diverticulitis     Comment:  divertics one small polyp (Dr. Fuller Plan) No date: GERD (gastroesophageal reflux disease)     Comment:  RARE No date: History of kidney stones No date: Hyperglycemia No date: Hyperlipidemia No date: Hypertension     Comment:  H/O No date: IBS (irritable bowel syndrome)     Comment:  diarrhea predominant 02/07/2005: Personal history of kidney stones     Comment:  CT ABD/pelvis --Diverticulitis sig left renal stone //               CT ABD w/o negative stones + multiple aortic L.N.               07/24/2004//   Reproductive/Obstetrics                             Anesthesia Physical  Anesthesia Plan  ASA: II  Anesthesia Plan: General   Post-op Pain Management:    Induction: Intravenous  PONV Risk Score and Plan: 2 and Ondansetron, Dexamethasone, Midazolam and Treatment may vary due to age or  medical condition  Airway Management Planned: Oral ETT  Additional Equipment:   Intra-op Plan:   Post-operative Plan: Extubation in OR  Informed Consent: I have reviewed the patients History and Physical, chart, labs and discussed the procedure including the risks, benefits and alternatives for the proposed anesthesia with the patient or authorized representative who has indicated his/her understanding and acceptance.       Plan Discussed with:   Anesthesia Plan Comments:         Anesthesia Quick Evaluation

## 2020-02-04 NOTE — Discharge Instructions (Signed)

## 2020-02-04 NOTE — Transfer of Care (Signed)
Immediate Anesthesia Transfer of Care Note  Patient: Stephen Hanna  Procedure(s) Performed: XI ROBOTIC ASSISTED VENTRAL HERNIA (N/A )  Patient Location: PACU  Anesthesia Type:General  Level of Consciousness: awake, alert  and oriented  Airway & Oxygen Therapy: Patient Spontanous Breathing  Post-op Assessment: Report given to RN and Post -op Vital signs reviewed and stable  Post vital signs: Reviewed and stable  Last Vitals:  Vitals Value Taken Time  BP    Temp    Pulse    Resp    SpO2      Last Pain:  Vitals:   02/04/20 0946  TempSrc: Temporal         Complications: No complications documented.

## 2020-02-04 NOTE — Interval H&P Note (Signed)
History and Physical Interval Note:  02/04/2020 9:53 AM  Stephen Hanna  has presented today for surgery, with the diagnosis of Incisional hernia.  The various methods of treatment have been discussed with the patient and family. After consideration of risks, benefits and other options for treatment, the patient has consented to  Procedure(s): XI ROBOTIC [place] (N/A) as a surgical intervention.  The patient's history has been reviewed, patient examined, no change in status, stable for surgery.  I have reviewed the patient's chart and labs.  Questions were answered to the patient's satisfaction.     [place]

## 2020-02-07 NOTE — Anesthesia Postprocedure Evaluation (Signed)
Anesthesia Post Note  Patient: Stephen Hanna  Procedure(s) Performed: XI ROBOTIC ASSISTED VENTRAL HERNIA (N/A )  Patient location during evaluation: PACU Anesthesia Type: General Level of consciousness: awake and alert Pain management: pain level controlled Vital Signs Assessment: post-procedure vital signs reviewed and stable Respiratory status: spontaneous breathing, nonlabored ventilation, respiratory function stable and patient connected to nasal cannula oxygen Cardiovascular status: blood pressure returned to baseline and stable Postop Assessment: no apparent nausea or vomiting Anesthetic complications: no   No complications documented.   Last Vitals:  Vitals:   02/04/20 1447 02/04/20 1523  BP: (!) 187/95 (!) 158/79  Pulse: 73 74  Resp: 20 20  Temp: (!) 36.3 C (!) 36.3 C  SpO2: 96% 96%    Last Pain:  Vitals:   02/05/20 0951  TempSrc:   PainSc: [name]

## 2020-02-11 ENCOUNTER — Encounter: Payer: Self-pay | Admitting: Surgery

## 2020-02-11 ENCOUNTER — Ambulatory Visit
Admission: RE | Admit: 2020-02-11 | Discharge: 2020-02-11 | Disposition: A | Discharge status: Home / Self Care | Payer: Medicare Other | Source: Ambulatory Visit | Attending: Surgery | Admitting: Surgery

## 2020-02-11 ENCOUNTER — Telehealth: Payer: Self-pay | Admitting: *Deleted

## 2020-02-11 ENCOUNTER — Ambulatory Visit (INDEPENDENT_AMBULATORY_CARE_PROVIDER_SITE_OTHER): Payer: Medicare Other | Admitting: Surgery

## 2020-02-11 ENCOUNTER — Other Ambulatory Visit: Payer: Self-pay

## 2020-02-11 DIAGNOSIS — R1031 Right lower quadrant pain: Secondary | ICD-10-CM

## 2020-02-11 DIAGNOSIS — N132 Hydronephrosis with renal and ureteral calculous obstruction: Secondary | ICD-10-CM | POA: Diagnosis not present

## 2020-02-11 DIAGNOSIS — N1330 Unspecified hydronephrosis: Secondary | ICD-10-CM

## 2020-02-11 DIAGNOSIS — N210 Calculus in bladder: Secondary | ICD-10-CM | POA: Diagnosis not present

## 2020-02-11 DIAGNOSIS — I700 Atherosclerosis of aorta: Secondary | ICD-10-CM | POA: Diagnosis not present

## 2020-02-11 MED ORDER — BACLOFEN 10 MG PO TABS: 10.0000 mg | ORAL_TABLET | Freq: Three times a day (TID) | ORAL | 0 refills | Status: DC

## 2020-02-11 NOTE — Patient Instructions (Addendum)
Take up to 4 ibuprofen along with the oxycodone every 6 hours.   [facility] Dr.Rodenberg will call you with the results.

## 2020-02-11 NOTE — Telephone Encounter (Signed)
Patient called and stated that he had surgery on 02/04/20 Dr Dahlia Byes ventral hernia and this morning he woke up in a lot of pain on his right side. He took a half of dose of oxycodone at 7:00am and 9:00am but its not helping. Please call and advise

## 2020-02-11 NOTE — Progress Notes (Addendum)
[facility] POST-OP OFFICE VISIT  02/11/2020  HPI: Stephen Hanna is a 64 y.o. male 7 days s/p robotic ventral hernia repair.  Postoperative course the patient reports spending most of his time in a recliner, avoiding laying supine.  He was trying his best to avoid stressing the area.  Pain was reasonably controlled utilizing minimal oxycodone, however this morning had severe exacerbation of right sided right upper quadrant abdominal pain.  We intercepted them in route to the hospital/emergency room.  He reports taking only 1 oxycodone this morning.  Has not utilized any ibuprofen, nor does he take any muscle relaxers.  He took 1 Zofran for some nausea associated with the pain and the pain pill that he took this morning.  He has a history of cholecystectomy.  And he has a history of anterior diaphragmatic hernia repair earlier this year.  Vital signs: BP (!) 172/98   Pulse (!) 55   Temp 98.1 F (36.7 C) (Oral)   Resp 16   Ht 6\' 3"  (1.905 m)   Wt 203 lb (92.1 kg)   SpO2 97%   BMI 25.37 kg/m    Physical Exam: Constitutional: well, clearly uncomfortable. Abdomen: tender/guarding RUQ.  Otherwise soft without guarding. Skin: incisions c/d/i.   Assessment/Plan: This is a 64 y.o. male 7 days s/p ventral hernia repair.   Patient Active Problem List   Diagnosis Date Noted  . Vaccine counseling 11/09/2019  . Diaphragmatic hernia 08/12/2019  . BPH (benign prostatic hyperplasia) 08/12/2019  . Welcome to Medicare preventive visit 03/05/2019  . Facial paresthesia 03/05/2019  . Coronary artery calcification 03/26/2018  . Prepatellar bursitis of right knee 05/28/2017  . Wheeze 10/30/2016  . Personal history of tobacco use, presenting hazards to health 02/24/2016  . Pain in joint, ankle and foot 02/10/2016  . Former smoker 02/10/2016  . History of hypertension 05/16/2014  . Advance care planning 01/28/2014  . Pain in joint, shoulder region 01/28/2014  . Pain in right knee  03/09/2013  . Skin thickening 01/26/2013  . Fatigue 01/04/2011  . IRRITABLE BOWEL SYNDROME 06/22/2010  . Renal stones 11/23/2008  . BENIGN PROSTATIC HYPERTROPHY, MILD, HX OF 10/14/2008  . OTHER AND UNSPECIFIED HYPERLIPIDEMIA 10/06/2008  . COLONIC POLYPS 10/08/2007  . DIVERTICULOSIS OF COLON 10/08/2007  . Hyperglycemia 10/08/2007  . BACK PAIN, LUMBAR 01/01/2007    - CT EXAM: CT ABDOMEN AND PELVIS WITHOUT CONTRAST  TECHNIQUE: Multidetector CT imaging of the abdomen and pelvis was performed following the standard protocol without IV contrast.  COMPARISON:  August 08, 2019.  FINDINGS: Lower chest: No acute abnormality.  Hepatobiliary: No focal liver abnormality is seen. Status post cholecystectomy. No biliary dilatation.  Pancreas: Unremarkable. No pancreatic ductal dilatation or surrounding inflammatory changes.  Spleen: Normal in size without focal abnormality.  Adrenals/Urinary Tract: Adrenal glands appear normal. Bilateral nephrolithiasis is noted. Mild right hydronephrosis is noted secondary to 7 mm calculus in proximal right ureter. At least 3 calculi are noted in the dependent portion of the urinary bladder.  Stomach/Bowel: Stomach is within normal limits. Appendix appears normal. No evidence of bowel wall thickening, distention, or inflammatory changes.  Vascular/Lymphatic: Aortic atherosclerosis. No enlarged abdominal or pelvic lymph nodes.  Reproductive: Stable mild prostatic enlargement is noted.  Other: Diaphragmatic hernia noted on prior exam appears to have been surgically repaired. 3.3 x 1.9 cm fluid collection is seen in the subcutaneous tissues of the anterior abdominal wall in the supraumbilical region most consistent with postoperative seroma.  Musculoskeletal: No acute or  significant osseous findings.  IMPRESSION: 1. Bilateral nephrolithiasis. Mild right hydronephrosis is noted secondary to 7 mm proximal right ureteral calculus. 2.  At least 3 calculi are noted in the dependent portion of the urinary bladder. 3. Stable mild prostatic enlargement. 4. Diaphragmatic hernia noted on prior exam appears to have been surgically repaired. 3.3 x 1.9 cm fluid collection is seen in the subcutaneous tissues of the anterior abdominal wall in the supraumbilical region most consistent with postoperative seroma. 5. Aortic atherosclerosis.  Aortic Atherosclerosis (ICD10-I70.0).   Electronically Signed   By: Marijo Conception M.D.   On: 02/11/2020 12:27  We have prescribed baclofen, ibuprofen, and utilizing 2 oxycodone every 4 hours as needed for pain.  We will make a referral for urological evaluation as soon as possible.  I attempted to reach out to him and left a message on his phone.  Ronny Bacon M.D., FACS 02/11/2020, 11:58 AM

## 2020-02-11 NOTE — Telephone Encounter (Signed)
Patients wife called and stated that he is in so much pain he is on the way to the ER but doesn't really want to go there so please call his wife at [phone]

## 2020-02-11 NOTE — Telephone Encounter (Signed)
Patient asked to come to office. Dr.Rodenberg will see patient.

## 2020-02-12 ENCOUNTER — Telehealth: Payer: Self-pay | Admitting: Surgery

## 2020-02-12 NOTE — Telephone Encounter (Signed)
Pt called after being seen by [name] yesterday (Dr. Dahlia Byes in [place]) & was told he would had kidney stones & will need to be seen by urology for further work up.  Stephen Hanna has not heard from that office yet & is beginning to wonder if he is supposed to call them directly.    Per DK, CMA, she will reach out to [facility] in an attempt to follow up on the urgent referral she placed yesterday & will call the pt back w/an update.  The pt is best reached @ [phone] or [phone].  Thank you

## 2020-02-15 NOTE — Telephone Encounter (Signed)
[facility] Friday and was unable to speak with anyone about patient's urgent referral.   Sent CMA, Caryllyn who is assisting at [facility] office today a secured chat asking if she could possibly ask the referral coordinator about patient's urgent referral. Per Caryllyn "their regular referral coordinator is on vacation. Lattie Haw the office manager is doing them and will take care of it first thing this morning." Patient was made aware of updates and verbalized understanding. Patient was also informed that if he doesn't hear anything by the end of business day today to give our office a call.

## 2020-02-17 ENCOUNTER — Ambulatory Visit: Payer: Medicare Other | Admitting: Urology

## 2020-02-17 ENCOUNTER — Ambulatory Visit (INDEPENDENT_AMBULATORY_CARE_PROVIDER_SITE_OTHER): Payer: Medicare Other | Admitting: Surgery

## 2020-02-17 ENCOUNTER — Ambulatory Visit
Admission: RE | Admit: 2020-02-17 | Discharge: 2020-02-17 | Disposition: A | Discharge status: Home / Self Care | Payer: Medicare Other | Attending: Urology | Admitting: Urology

## 2020-02-17 ENCOUNTER — Ambulatory Visit
Admission: RE | Admit: 2020-02-17 | Discharge: 2020-02-17 | Disposition: A | Discharge status: Home / Self Care | Payer: Medicare Other | Source: Ambulatory Visit | Attending: Urology | Admitting: Urology

## 2020-02-17 ENCOUNTER — Other Ambulatory Visit: Payer: Self-pay | Admitting: Family Medicine

## 2020-02-17 ENCOUNTER — Encounter: Payer: Self-pay | Admitting: Surgery

## 2020-02-17 ENCOUNTER — Other Ambulatory Visit: Payer: Self-pay

## 2020-02-17 VITALS — BP 144/88 | HR 67 | Ht 75.0 in | Wt 206.4 lb

## 2020-02-17 VITALS — BP 155/93 | HR 69 | Temp 98.3°F | Resp 12 | Wt 207.6 lb

## 2020-02-17 DIAGNOSIS — N200 Calculus of kidney: Secondary | ICD-10-CM

## 2020-02-17 DIAGNOSIS — N23 Unspecified renal colic: Secondary | ICD-10-CM

## 2020-02-17 DIAGNOSIS — N132 Hydronephrosis with renal and ureteral calculous obstruction: Secondary | ICD-10-CM

## 2020-02-17 DIAGNOSIS — N201 Calculus of ureter: Secondary | ICD-10-CM

## 2020-02-17 DIAGNOSIS — Z125 Encounter for screening for malignant neoplasm of prostate: Secondary | ICD-10-CM

## 2020-02-17 DIAGNOSIS — N210 Calculus in bladder: Secondary | ICD-10-CM | POA: Diagnosis not present

## 2020-02-17 DIAGNOSIS — I2584 Coronary atherosclerosis due to calcified coronary lesion: Secondary | ICD-10-CM

## 2020-02-17 DIAGNOSIS — Z09 Encounter for follow-up examination after completed treatment for conditions other than malignant neoplasm: Secondary | ICD-10-CM

## 2020-02-17 DIAGNOSIS — I2510 Atherosclerotic heart disease of native coronary artery without angina pectoris: Secondary | ICD-10-CM

## 2020-02-17 MED ORDER — OXYCODONE-ACETAMINOPHEN 5-325 MG PO TABS: 1.0000 | ORAL_TABLET | ORAL | 0 refills | Status: AC | PRN

## 2020-02-17 MED ORDER — GABAPENTIN 300 MG PO CAPS: 300.0000 mg | ORAL_CAPSULE | Freq: Three times a day (TID) | ORAL | 0 refills | Status: DC

## 2020-02-17 NOTE — Patient Instructions (Signed)
Take your prescription to your local pharmacy and also pick up your prescription.   See your appointment below. Call the office if you have any questions or concerns.

## 2020-02-17 NOTE — Progress Notes (Signed)
02/17/2020 2:42 PM   Stephen Hanna May 29, 1955 [id number]  Referring provider: Ronny Bacon, MD [address] [place],  [place] [address]  Chief Complaint  Patient presents with  . Nephrolithiasis    HPI: 64 y.o. male seen at the request of [name] for evaluation of urinary tract calculi and hydronephrosis.   Status post robotic assisted laparoscopic incisional hernia repair 02/04/2020 by Dr. Maxwell Marion [name] 02/11/2020 with acute onset of right flank and right upper quadrant abdominal pain  Prior CT April 2021 remarkable for two right UVJ calculi measuring three and 7 mm along with bilateral, nonobstructing renal calculi  Repeat CT 02/11/2020 was remarkable for a 7 mm right proximal ureteral calculus and three calculi in the bladder the largest measuring approximately 8 mm.  Mild right hydronephrosis was present  Since his acute pain episode 10/7 he presently is having only mild right lower quadrant pain  Denies fever, chills, nausea, vomiting  He does note baseline urinary frequency and occasional urge incontinence   PMH: Past Medical History:  Diagnosis Date  . Arthritis    LEFT SHOULDER  . Back pain, chronic    after fall from ladder in 1980s with episodic flares  . BPH (benign prostatic hyperplasia)   . Diverticulitis 10/2010   divertics one small polyp (Dr. Fuller Plan)  . GERD (gastroesophageal reflux disease)    RARE  . History of kidney stones   . Hyperglycemia   . Hyperlipidemia   . Hypertension    H/O  . IBS (irritable bowel syndrome)    diarrhea predominant  . Personal history of kidney stones 02/07/2005   CT ABD/pelvis --Diverticulitis sig left renal stone // CT ABD w/o negative stones + multiple aortic L.N. 07/24/2004//    Surgical History: Past Surgical History:  Procedure Laterality Date  . CHOLECYSTECTOMY    . LAPAROSCOPIC CHOLECYSTECTOMY W/ CHOLANGIOGRAPHY  12/27/2008   Ct abd/pelvis w/o no gallstones mild dilation  entire right ureter lap choley (Dr. Bary Castilla)  . VASECTOMY  1990  . XI ROBOTIC ASSISTED PARAESOPHAGEAL HERNIA REPAIR N/A 09/03/2019   Procedure: XI ROBOTIC ASSISTED Diaphragmatic hernia REPAIR;  Surgeon: Jules Husbands, MD;  Location: [facility] ORS;  Service: General;  Laterality: N/A;  . XI ROBOTIC ASSISTED VENTRAL HERNIA N/A 02/04/2020   Procedure: XI ROBOTIC ASSISTED VENTRAL HERNIA;  Surgeon: Jules Husbands, MD;  Location: [facility] ORS;  Service: General;  Laterality: N/A;    Home Medications:  Allergies as of 02/17/2020      Reactions   Atorvastatin    Aches at 30mg  but not 20mg  a day.    Chantix [varenicline]    Abnormal dreams   Hydrocodone-acetaminophen Other (See Comments)   edgy, intolerant   Lisinopril Other (See Comments)   cough      Medication List       Accurate as of February 17, 2020  2:42 PM. If you have any questions, ask your nurse or doctor.        ascorbic acid 500 MG tablet Commonly known as: VITAMIN C Take 500 mg by mouth daily.   atorvastatin 20 MG tablet Commonly known as: LIPITOR Take 20 mg by mouth daily.   baclofen 10 MG tablet Commonly known as: LIORESAL Take 1 tablet (10 mg total) by mouth 3 (three) times daily.   naproxen sodium 220 MG tablet Commonly known as: ALEVE Take 220 mg by mouth daily.   Nasal Spray Moisturizing 12 HR 0.05 % nasal spray Generic drug: oxymetazoline Place  1 spray into both nostrils 2 (two) times daily. D& J nasal spray   oxyCODONE-acetaminophen 5-325 MG tablet Commonly known as: Percocet Take 1-2 tablets by mouth every 4 (four) hours as needed for severe pain.   psyllium 58.6 % packet Commonly known as: METAMUCIL Take 1 packet by mouth daily.   vitamin B-12 1000 MCG tablet Commonly known as: CYANOCOBALAMIN Take 1,000 mcg by mouth daily.       Allergies:  Allergies  Allergen Reactions  . Atorvastatin     Aches at 30mg  but not 20mg  a day.   . Chantix [Varenicline]     Abnormal dreams  .  Hydrocodone-Acetaminophen Other (See Comments)    edgy, intolerant  . Lisinopril Other (See Comments)    cough    Family History: Family History  Problem Relation Age of Onset  . Heart disease Father 57       MI  . Kidney disease Father   . Depression Maternal Grandmother   . Alcohol abuse Neg Hx   . Stroke Neg Hx   . Prostate cancer Neg Hx   . Colon cancer Neg Hx     Social History:  reports that he quit smoking about 5 years ago. His smoking use included cigarettes. He has a 30.00 pack-year smoking history. He has never used smokeless tobacco. He reports current alcohol use of about 1.0 standard drink of alcohol per week. He reports that he does not use drugs.   Physical Exam: BP (!) 144/88 (BP Location: Left Arm, Patient Position: Sitting, Cuff Size: Normal)   Pulse 67   Ht 6\' 3"  (1.905 m)   Wt 206 lb 6.4 oz (93.6 kg)   BMI 25.80 kg/m   Constitutional:  Alert and oriented, No acute distress. HEENT: [place] AT, moist mucus membranes.  Trachea midline, no masses. Cardiovascular: No clubbing, cyanosis, or edema.  RRR Respiratory: Normal respiratory effort, no increased work of breathing.  Clear GI: Abdomen is soft, nontender, nondistended, no abdominal masses GU: No CVA tenderness Lymph: No cervical or inguinal lymphadenopathy. Skin: No rashes, bruises or suspicious lesions. Neurologic: Grossly intact, no focal deficits, moving all 4 extremities. Psychiatric: Normal mood and affect.  Laboratory Data: Lab Results  Component Value Date   WBC 9.0 02/02/2020   HGB 14.8 02/02/2020   HCT 42.9 02/02/2020   MCV 88.1 02/02/2020   PLT 224 02/02/2020    Lab Results  Component Value Date   CREATININE 0.84 02/02/2020    Lab Results  Component Value Date   PSA 1.69 02/23/2019   PSA 1.70 02/14/2018   PSA 1.89 02/07/2017    Urinalysis Dipstick 2+ blood Microscopy 11-30 RBC  Pertinent Imaging: CTs 11/24/2019 and 08/25/2019 were personally reviewed  CT Renal Stone  Study  Narrative CLINICAL DATA:  Sudden onset of right flank pain while mowing the grass. History of kidney stones  EXAM: CT ABDOMEN AND PELVIS WITHOUT CONTRAST  TECHNIQUE: Multidetector CT imaging of the abdomen and pelvis was performed following the standard protocol without IV contrast.  COMPARISON:  Remote abdominal CT 11/26/2008  FINDINGS: Lower chest: Anterior diaphragmatic hernia just to the right of midline with herniation of omental fat and hepatic flexure of the colon, unchanged from chest CT 03/12/2019. Hernia defect best appreciated on coronal and sagittal reformats, series 5, image 25 and series 6, image 83. Mild adjacent atelectasis or scarring in the right lung base.  Hepatobiliary: Prominent liver spanning 20 cm cranial caudal. No evidence of focal lesion on noncontrast exam. Clips in  the gallbladder fossa postcholecystectomy. No biliary dilatation.  Pancreas: No ductal dilatation or inflammation.  Spleen: Normal in size without focal abnormality.  Adrenals/Urinary Tract: Bilateral adrenal thickening without dominant nodule.  Moderate right hydroureteronephrosis. There are 2 adjacent stones at the right ureterovesicular junction with more proximal stone measuring 3 x 3 mm and adjacent stone measuring 10 x 7 mm. There are 3 nonobstructing stones in the right kidney largest in the upper pole measuring 6 mm. Mild right perinephric edema.  Two punctate nonobstructing stones in the left kidney. No left hydronephrosis. The left ureter is decompressed. Small low-density in the upper left kidney is incompletely characterized on noncontrast exam but may represent focal scarring when compared with prior.  Urinary bladder is minimally distended. Bladder wall thickening may be related to known extension.  Stomach/Bowel: Stomach is unremarkable. Normal positioning of the ligament of Treitz. No small bowel obstruction or inflammation. Appendix is normal. Hepatic  flexure colon courses through an anterior diaphragmatic defect into the lower thorax. No obstruction or inflammatory change. Moderate diverticulosis of the distal descending and sigmoid colon. No acute diverticulitis.  Vascular/Lymphatic: Mild aortic atherosclerosis. No aortic aneurysm. No bulky abdominopelvic lymph nodes.  Reproductive: Enlarged prostate gland spanning 5.6 cm transverse causing mass effect on the bladder base.  Other: Fat within both inguinal canals. Tiny fat containing umbilical hernia. No free air free fluid. Anterior diaphragmatic hernia as described above.  Musculoskeletal: There are no acute or suspicious osseous abnormalities. Degenerative change in the lumbar spine is most prominent at L5-S1. There is a bone island within posterior elements of left L4.  IMPRESSION: 1. Moderate right hydroureteronephrosis with 2 adjacent stones at the right ureterovesicular junction. Proximal stone measures 3.3 mm, with adjacent larger stone 10 x 7 mm. 2. Additional nonobstructing stones in both kidneys. 3. Colonic diverticulosis without diverticulitis. 4. Right right anterior diaphragmatic hernia with herniation of omental fat and hepatic flexure of the colon, unchanged from prior lung cancer screening chest CT November 2020. No evidence of obstruction or inflammation. Consider elective surgical evaluation. 5. Enlarged prostate gland causing mass effect on the bladder base.  Aortic Atherosclerosis (ICD10-I70.0).   Electronically Signed By: Keith Rake M.D. On: 08/08/2019 16:13   Assessment & Plan:    64 y.o. male with a 7 mm right proximal ureteral calculus, bladder calculi and bilateral nephrolithiasis.  The bladder calculi represent the previously noted distal ureteral calculi from April 2021 and he has most likely not passed secondary to BPH.  The bladder calculi, right ureteral calculus and right renal calculi could all be treated under the same  anesthetic with cystolitholapaxy and ureteroscopy with laser lithotripsy.  The option of shockwave lithotripsy was discussed however would require multiple procedures with a lower success rate.  We will obtain a KUB to see if there has been any distal progression of the right ureteral calculus.  He would like to think over this option before scheduling.  Potential risks were discussed including bleeding, infection, ureteral injury and inability to remove the ureteral/renal calculi secondary to anatomy and inability to access the ureter.  The need for a ureteral stent placement postoperatively was discussed along with indwelling stent side effects.   Abbie Sons, [place] [address] [place],  [address] [phone]

## 2020-02-17 NOTE — H&P (View-Only) (Signed)
02/17/2020 2:42 PM   Jomarie Longs Nov 16, 1955 [id number]  Referring provider: Ronny Bacon, MD [address] [place],  [place] [address]  Chief Complaint  Patient presents with  . Nephrolithiasis    HPI: 64 y.o. male seen at the request of [name] for evaluation of urinary tract calculi and hydronephrosis.   Status post robotic assisted laparoscopic incisional hernia repair 02/04/2020 by Dr. Maxwell Marion [name] 02/11/2020 with acute onset of right flank and right upper quadrant abdominal pain  Prior CT April 2021 remarkable for two right UVJ calculi measuring three and 7 mm along with bilateral, nonobstructing renal calculi  Repeat CT 02/11/2020 was remarkable for a 7 mm right proximal ureteral calculus and three calculi in the bladder the largest measuring approximately 8 mm.  Mild right hydronephrosis was present  Since his acute pain episode 10/7 he presently is having only mild right lower quadrant pain  Denies fever, chills, nausea, vomiting  He does note baseline urinary frequency and occasional urge incontinence   PMH: Past Medical History:  Diagnosis Date  . Arthritis    LEFT SHOULDER  . Back pain, chronic    after fall from ladder in 1980s with episodic flares  . BPH (benign prostatic hyperplasia)   . Diverticulitis 10/2010   divertics one small polyp (Dr. Fuller Plan)  . GERD (gastroesophageal reflux disease)    RARE  . History of kidney stones   . Hyperglycemia   . Hyperlipidemia   . Hypertension    H/O  . IBS (irritable bowel syndrome)    diarrhea predominant  . Personal history of kidney stones 02/07/2005   CT ABD/pelvis --Diverticulitis sig left renal stone // CT ABD w/o negative stones + multiple aortic L.N. 07/24/2004//    Surgical History: Past Surgical History:  Procedure Laterality Date  . CHOLECYSTECTOMY    . LAPAROSCOPIC CHOLECYSTECTOMY W/ CHOLANGIOGRAPHY  12/27/2008   Ct abd/pelvis w/o no gallstones mild dilation  entire right ureter lap choley (Dr. Bary Castilla)  . VASECTOMY  1990  . XI ROBOTIC ASSISTED PARAESOPHAGEAL HERNIA REPAIR N/A 09/03/2019   Procedure: XI ROBOTIC ASSISTED Diaphragmatic hernia REPAIR;  Surgeon: Jules Husbands, MD;  Location: [facility] ORS;  Service: General;  Laterality: N/A;  . XI ROBOTIC ASSISTED VENTRAL HERNIA N/A 02/04/2020   Procedure: XI ROBOTIC ASSISTED VENTRAL HERNIA;  Surgeon: Jules Husbands, MD;  Location: [facility] ORS;  Service: General;  Laterality: N/A;    Home Medications:  Allergies as of 02/17/2020      Reactions   Atorvastatin    Aches at 30mg  but not 20mg  a day.    Chantix [varenicline]    Abnormal dreams   Hydrocodone-acetaminophen Other (See Comments)   edgy, intolerant   Lisinopril Other (See Comments)   cough      Medication List       Accurate as of February 17, 2020  2:42 PM. If you have any questions, ask your nurse or doctor.        ascorbic acid 500 MG tablet Commonly known as: VITAMIN C Take 500 mg by mouth daily.   atorvastatin 20 MG tablet Commonly known as: LIPITOR Take 20 mg by mouth daily.   baclofen 10 MG tablet Commonly known as: LIORESAL Take 1 tablet (10 mg total) by mouth 3 (three) times daily.   naproxen sodium 220 MG tablet Commonly known as: ALEVE Take 220 mg by mouth daily.   Nasal Spray Moisturizing 12 HR 0.05 % nasal spray Generic drug: oxymetazoline Place  1 spray into both nostrils 2 (two) times daily. D& J nasal spray   oxyCODONE-acetaminophen 5-325 MG tablet Commonly known as: Percocet Take 1-2 tablets by mouth every 4 (four) hours as needed for severe pain.   psyllium 58.6 % packet Commonly known as: METAMUCIL Take 1 packet by mouth daily.   vitamin B-12 1000 MCG tablet Commonly known as: CYANOCOBALAMIN Take 1,000 mcg by mouth daily.       Allergies:  Allergies  Allergen Reactions  . Atorvastatin     Aches at 30mg  but not 20mg  a day.   . Chantix [Varenicline]     Abnormal dreams  .  Hydrocodone-Acetaminophen Other (See Comments)    edgy, intolerant  . Lisinopril Other (See Comments)    cough    Family History: Family History  Problem Relation Age of Onset  . Heart disease Father 16       MI  . Kidney disease Father   . Depression Maternal Grandmother   . Alcohol abuse Neg Hx   . Stroke Neg Hx   . Prostate cancer Neg Hx   . Colon cancer Neg Hx     Social History:  reports that he quit smoking about 5 years ago. His smoking use included cigarettes. He has a 30.00 pack-year smoking history. He has never used smokeless tobacco. He reports current alcohol use of about 1.0 standard drink of alcohol per week. He reports that he does not use drugs.   Physical Exam: BP (!) 144/88 (BP Location: Left Arm, Patient Position: Sitting, Cuff Size: Normal)   Pulse 67   Ht 6\' 3"  (1.905 m)   Wt 206 lb 6.4 oz (93.6 kg)   BMI 25.80 kg/m   Constitutional:  Alert and oriented, No acute distress. HEENT: [place] AT, moist mucus membranes.  Trachea midline, no masses. Cardiovascular: No clubbing, cyanosis, or edema.  RRR Respiratory: Normal respiratory effort, no increased work of breathing.  Clear GI: Abdomen is soft, nontender, nondistended, no abdominal masses GU: No CVA tenderness Lymph: No cervical or inguinal lymphadenopathy. Skin: No rashes, bruises or suspicious lesions. Neurologic: Grossly intact, no focal deficits, moving all 4 extremities. Psychiatric: Normal mood and affect.  Laboratory Data: Lab Results  Component Value Date   WBC 9.0 02/02/2020   HGB 14.8 02/02/2020   HCT 42.9 02/02/2020   MCV 88.1 02/02/2020   PLT 224 02/02/2020    Lab Results  Component Value Date   CREATININE 0.84 02/02/2020    Lab Results  Component Value Date   PSA 1.69 02/23/2019   PSA 1.70 02/14/2018   PSA 1.89 02/07/2017    Urinalysis Dipstick 2+ blood Microscopy 11-30 RBC  Pertinent Imaging: CTs 11/24/2019 and 08/25/2019 were personally reviewed  CT Renal Stone  Study  Narrative CLINICAL DATA:  Sudden onset of right flank pain while mowing the grass. History of kidney stones  EXAM: CT ABDOMEN AND PELVIS WITHOUT CONTRAST  TECHNIQUE: Multidetector CT imaging of the abdomen and pelvis was performed following the standard protocol without IV contrast.  COMPARISON:  Remote abdominal CT 11/26/2008  FINDINGS: Lower chest: Anterior diaphragmatic hernia just to the right of midline with herniation of omental fat and hepatic flexure of the colon, unchanged from chest CT 03/12/2019. Hernia defect best appreciated on coronal and sagittal reformats, series 5, image 25 and series 6, image 83. Mild adjacent atelectasis or scarring in the right lung base.  Hepatobiliary: Prominent liver spanning 20 cm cranial caudal. No evidence of focal lesion on noncontrast exam. Clips in  the gallbladder fossa postcholecystectomy. No biliary dilatation.  Pancreas: No ductal dilatation or inflammation.  Spleen: Normal in size without focal abnormality.  Adrenals/Urinary Tract: Bilateral adrenal thickening without dominant nodule.  Moderate right hydroureteronephrosis. There are 2 adjacent stones at the right ureterovesicular junction with more proximal stone measuring 3 x 3 mm and adjacent stone measuring 10 x 7 mm. There are 3 nonobstructing stones in the right kidney largest in the upper pole measuring 6 mm. Mild right perinephric edema.  Two punctate nonobstructing stones in the left kidney. No left hydronephrosis. The left ureter is decompressed. Small low-density in the upper left kidney is incompletely characterized on noncontrast exam but may represent focal scarring when compared with prior.  Urinary bladder is minimally distended. Bladder wall thickening may be related to known extension.  Stomach/Bowel: Stomach is unremarkable. Normal positioning of the ligament of Treitz. No small bowel obstruction or inflammation. Appendix is normal. Hepatic  flexure colon courses through an anterior diaphragmatic defect into the lower thorax. No obstruction or inflammatory change. Moderate diverticulosis of the distal descending and sigmoid colon. No acute diverticulitis.  Vascular/Lymphatic: Mild aortic atherosclerosis. No aortic aneurysm. No bulky abdominopelvic lymph nodes.  Reproductive: Enlarged prostate gland spanning 5.6 cm transverse causing mass effect on the bladder base.  Other: Fat within both inguinal canals. Tiny fat containing umbilical hernia. No free air free fluid. Anterior diaphragmatic hernia as described above.  Musculoskeletal: There are no acute or suspicious osseous abnormalities. Degenerative change in the lumbar spine is most prominent at L5-S1. There is a bone island within posterior elements of left L4.  IMPRESSION: 1. Moderate right hydroureteronephrosis with 2 adjacent stones at the right ureterovesicular junction. Proximal stone measures 3.3 mm, with adjacent larger stone 10 x 7 mm. 2. Additional nonobstructing stones in both kidneys. 3. Colonic diverticulosis without diverticulitis. 4. Right right anterior diaphragmatic hernia with herniation of omental fat and hepatic flexure of the colon, unchanged from prior lung cancer screening chest CT November 2020. No evidence of obstruction or inflammation. Consider elective surgical evaluation. 5. Enlarged prostate gland causing mass effect on the bladder base.  Aortic Atherosclerosis (ICD10-I70.0).   Electronically Signed By: Keith Rake M.D. On: 08/08/2019 16:13   Assessment & Plan:    64 y.o. male with a 7 mm right proximal ureteral calculus, bladder calculi and bilateral nephrolithiasis.  The bladder calculi represent the previously noted distal ureteral calculi from April 2021 and he has most likely not passed secondary to BPH.  The bladder calculi, right ureteral calculus and right renal calculi could all be treated under the same  anesthetic with cystolitholapaxy and ureteroscopy with laser lithotripsy.  The option of shockwave lithotripsy was discussed however would require multiple procedures with a lower success rate.  We will obtain a KUB to see if there has been any distal progression of the right ureteral calculus.  He would like to think over this option before scheduling.  Potential risks were discussed including bleeding, infection, ureteral injury and inability to remove the ureteral/renal calculi secondary to anatomy and inability to access the ureter.  The need for a ureteral stent placement postoperatively was discussed along with indwelling stent side effects.   Abbie Sons, [place] [address], [place],  [address] [phone]

## 2020-02-18 ENCOUNTER — Encounter: Payer: Self-pay | Admitting: Urology

## 2020-02-18 DIAGNOSIS — N132 Hydronephrosis with renal and ureteral calculous obstruction: Secondary | ICD-10-CM | POA: Insufficient documentation

## 2020-02-18 DIAGNOSIS — N201 Calculus of ureter: Secondary | ICD-10-CM | POA: Insufficient documentation

## 2020-02-18 DIAGNOSIS — N210 Calculus in bladder: Secondary | ICD-10-CM | POA: Insufficient documentation

## 2020-02-18 DIAGNOSIS — N23 Unspecified renal colic: Secondary | ICD-10-CM | POA: Insufficient documentation

## 2020-02-18 LAB — URINALYSIS, COMPLETE
Bilirubin, UA: NEGATIVE
Glucose, UA: NEGATIVE
Ketones, UA: NEGATIVE
Leukocytes,UA: NEGATIVE
Nitrite, UA: NEGATIVE
Protein,UA: NEGATIVE
Specific Gravity, UA: >1.03 — ABNORMAL HIGH (ref 1.005–1.030)
Urobilinogen, Ur: 0.2 mg/dL (ref 0.2–1.0)
pH, UA: 6 (ref 5.0–7.5)

## 2020-02-18 LAB — MICROSCOPIC EXAMINATION: Bacteria, UA: NONE SEEN

## 2020-02-19 ENCOUNTER — Telehealth: Payer: Self-pay | Admitting: Urology

## 2020-02-19 NOTE — Telephone Encounter (Signed)
He was not contacted about the x-ray results because it has not been read by radiology and was not in my inbox.  The x-ray is similar to CT findings with a stone in the right ureter, calculi in the bladder and calculi in the kidney  I had recommended at the office visit that we schedule ureteroscopy and cystolitholapaxy and he indicated he wanted to wait until he had been seen by general surgery  It looks like Dr. Dahlia Byes sent in a prescription for pain medication on 10/13  If he wants to proceed with ureteroscopy will get a scheduling sheet to Amy

## 2020-02-19 NOTE — Telephone Encounter (Signed)
Patient was seen 02/17/20 and had a subsequent X-ray.  He is calling the office to find out the results of the xray and determine the next steps.  Please advise.  Patient can be reached at [phone].  Also, it was discussed that a prescription would be sent to help with the pain.  His pharmacy is [facility] in [place].

## 2020-02-19 NOTE — Telephone Encounter (Signed)
LMOM for patient to return call.

## 2020-02-22 NOTE — Telephone Encounter (Signed)
Patient returned the call.  I advised him of the x-ray results and he would like to proceed with surgery.    Please provide surgery posting sheet to Amy for scheduling.

## 2020-02-22 NOTE — Progress Notes (Signed)
S/p robotic ventral Doing well Had some pain issues and another kidney stone No fevers, no chills, taking PO and ambulating  PE: NAD Abd: soft, incisions c/d/i, mild appropriate incisional tenderness, no peritonitis or infection, no recurrence  A/P Doing well I will only one last narcotic refill Added gabapentin RTC in a few weeks vs prn depending on pot preference

## 2020-02-22 NOTE — Telephone Encounter (Signed)
LMOM for patient to return call.

## 2020-02-25 ENCOUNTER — Other Ambulatory Visit: Payer: Self-pay | Admitting: Radiology

## 2020-02-25 ENCOUNTER — Other Ambulatory Visit: Payer: Medicare Other

## 2020-02-25 ENCOUNTER — Other Ambulatory Visit: Payer: Self-pay

## 2020-02-25 ENCOUNTER — Inpatient Hospital Stay: Admission: RE | Admit: 2020-02-25 | Payer: Medicare Other | Source: Ambulatory Visit

## 2020-02-25 DIAGNOSIS — N200 Calculus of kidney: Secondary | ICD-10-CM | POA: Diagnosis not present

## 2020-02-25 DIAGNOSIS — N210 Calculus in bladder: Secondary | ICD-10-CM

## 2020-02-25 DIAGNOSIS — N201 Calculus of ureter: Secondary | ICD-10-CM

## 2020-02-26 ENCOUNTER — Other Ambulatory Visit
Admission: RE | Admit: 2020-02-26 | Discharge: 2020-02-26 | Disposition: A | Discharge status: Home / Self Care | Payer: Medicare Other | Source: Ambulatory Visit | Attending: Urology | Admitting: Urology

## 2020-02-26 DIAGNOSIS — Z20822 Contact with and (suspected) exposure to covid-19: Secondary | ICD-10-CM | POA: Diagnosis not present

## 2020-02-26 DIAGNOSIS — Z01812 Encounter for preprocedural laboratory examination: Secondary | ICD-10-CM | POA: Diagnosis not present

## 2020-02-27 LAB — SARS CORONAVIRUS 2 (TAT 6-24 HRS): SARS Coronavirus 2: NEGATIVE

## 2020-02-28 ENCOUNTER — Telehealth: Payer: Self-pay | Admitting: Urology

## 2020-02-28 ENCOUNTER — Other Ambulatory Visit: Payer: Self-pay | Admitting: Urology

## 2020-02-28 LAB — CULTURE, URINE COMPREHENSIVE

## 2020-02-28 MED ORDER — SULFAMETHOXAZOLE-TRIMETHOPRIM 800-160 MG PO TABS: 1.0000 | ORAL_TABLET | Freq: Two times a day (BID) | ORAL | 0 refills | Status: AC

## 2020-02-28 NOTE — Telephone Encounter (Signed)
Urine culture grew a low level of bacteria consistent with colonization.  Antibiotic Rx was sent to pharmacy for him to start 10/25.  Please change preop IV antibiotic to ceftriaxone 1 g

## 2020-02-29 ENCOUNTER — Other Ambulatory Visit: Payer: Self-pay | Admitting: Radiology

## 2020-02-29 ENCOUNTER — Inpatient Hospital Stay: Admission: RE | Admit: 2020-02-29 | Payer: Medicare Other | Source: Ambulatory Visit

## 2020-02-29 DIAGNOSIS — N200 Calculus of kidney: Secondary | ICD-10-CM

## 2020-02-29 DIAGNOSIS — N210 Calculus in bladder: Secondary | ICD-10-CM

## 2020-02-29 DIAGNOSIS — N201 Calculus of ureter: Secondary | ICD-10-CM

## 2020-02-29 MED ORDER — SODIUM CHLORIDE 0.9 % IV SOLN
1.0000 g | Freq: Once | INTRAVENOUS | Status: AC
  Administered 2020-03-01: 1 g via INTRAVENOUS
  Filled 2020-02-29: qty 1

## 2020-03-01 ENCOUNTER — Ambulatory Visit
Admission: RE | Admit: 2020-03-01 | Discharge: 2020-03-01 | Disposition: A | Discharge status: Home / Self Care | Payer: Medicare Other | Attending: Urology | Admitting: Urology

## 2020-03-01 ENCOUNTER — Ambulatory Visit: Payer: Medicare Other | Admitting: Anesthesiology

## 2020-03-01 ENCOUNTER — Other Ambulatory Visit: Payer: Medicare Other

## 2020-03-01 ENCOUNTER — Encounter: Payer: Self-pay | Admitting: Urology

## 2020-03-01 ENCOUNTER — Encounter
Admission: RE | Disposition: A | Discharge status: Home / Self Care | Payer: Self-pay | Source: Home / Self Care | Attending: Urology

## 2020-03-01 ENCOUNTER — Ambulatory Visit: Payer: Medicare Other

## 2020-03-01 ENCOUNTER — Other Ambulatory Visit: Payer: Self-pay | Admitting: Urology

## 2020-03-01 ENCOUNTER — Other Ambulatory Visit: Payer: Self-pay

## 2020-03-01 ENCOUNTER — Telehealth: Payer: Self-pay | Admitting: Urology

## 2020-03-01 DIAGNOSIS — I10 Essential (primary) hypertension: Secondary | ICD-10-CM | POA: Insufficient documentation

## 2020-03-01 DIAGNOSIS — Z888 Allergy status to other drugs, medicaments and biological substances status: Secondary | ICD-10-CM | POA: Diagnosis not present

## 2020-03-01 DIAGNOSIS — Z87442 Personal history of urinary calculi: Secondary | ICD-10-CM | POA: Insufficient documentation

## 2020-03-01 DIAGNOSIS — N200 Calculus of kidney: Secondary | ICD-10-CM

## 2020-03-01 DIAGNOSIS — N202 Calculus of kidney with calculus of ureter: Secondary | ICD-10-CM | POA: Diagnosis not present

## 2020-03-01 DIAGNOSIS — N132 Hydronephrosis with renal and ureteral calculous obstruction: Secondary | ICD-10-CM | POA: Diagnosis not present

## 2020-03-01 DIAGNOSIS — Z885 Allergy status to narcotic agent status: Secondary | ICD-10-CM | POA: Diagnosis not present

## 2020-03-01 DIAGNOSIS — Z87891 Personal history of nicotine dependence: Secondary | ICD-10-CM | POA: Diagnosis not present

## 2020-03-01 DIAGNOSIS — N210 Calculus in bladder: Secondary | ICD-10-CM

## 2020-03-01 DIAGNOSIS — I2510 Atherosclerotic heart disease of native coronary artery without angina pectoris: Secondary | ICD-10-CM | POA: Insufficient documentation

## 2020-03-01 DIAGNOSIS — N201 Calculus of ureter: Secondary | ICD-10-CM

## 2020-03-01 HISTORY — PX: CYSTOSCOPY W/ RETROGRADES: SHX1426

## 2020-03-01 HISTORY — PX: CYSTOSCOPY WITH LITHOLAPAXY: SHX1425

## 2020-03-01 HISTORY — PX: CYSTOSCOPY/URETEROSCOPY/HOLMIUM LASER/STENT PLACEMENT: SHX6546

## 2020-03-01 SURGERY — CYSTOSCOPY/URETEROSCOPY/HOLMIUM LASER/STENT PLACEMENT
Anesthesia: General | Laterality: Right

## 2020-03-01 MED ORDER — PROPOFOL 10 MG/ML IV BOLUS
INTRAVENOUS | Status: DC | PRN
  Administered 2020-03-01: 150 mg via INTRAVENOUS

## 2020-03-01 MED ORDER — TAMSULOSIN HCL 0.4 MG PO CAPS: 0.4000 mg | ORAL_CAPSULE | Freq: Every day | ORAL | 0 refills | Status: DC

## 2020-03-01 MED ORDER — DEXAMETHASONE SODIUM PHOSPHATE 10 MG/ML IJ SOLN
INTRAMUSCULAR | Status: AC
  Filled 2020-03-01: qty 1

## 2020-03-01 MED ORDER — EPHEDRINE 5 MG/ML INJ
INTRAVENOUS | Status: AC
  Filled 2020-03-01: qty 10

## 2020-03-01 MED ORDER — LACTATED RINGERS IV SOLN: INTRAVENOUS | Status: DC

## 2020-03-01 MED ORDER — ONDANSETRON HCL 4 MG/2ML IJ SOLN
INTRAMUSCULAR | Status: DC | PRN
  Administered 2020-03-01: 4 mg via INTRAVENOUS

## 2020-03-01 MED ORDER — URIBEL 118 MG PO CAPS: 1.0000 | ORAL_CAPSULE | Freq: Three times a day (TID) | ORAL | 0 refills | Status: DC | PRN

## 2020-03-01 MED ORDER — LIDOCAINE HCL (PF) 2 % IJ SOLN
INTRAMUSCULAR | Status: AC
  Filled 2020-03-01: qty 5

## 2020-03-01 MED ORDER — OXYCODONE-ACETAMINOPHEN 5-325 MG PO TABS: 1.0000 | ORAL_TABLET | Freq: Four times a day (QID) | ORAL | 0 refills | Status: DC | PRN

## 2020-03-01 MED ORDER — DEXAMETHASONE SODIUM PHOSPHATE 10 MG/ML IJ SOLN
INTRAMUSCULAR | Status: DC | PRN
  Administered 2020-03-01: 8 mg via INTRAVENOUS

## 2020-03-01 MED ORDER — ONDANSETRON HCL 4 MG/2ML IJ SOLN: 4.0000 mg | Freq: Once | INTRAMUSCULAR | Status: DC | PRN

## 2020-03-01 MED ORDER — FENTANYL CITRATE (PF) 100 MCG/2ML IJ SOLN
INTRAMUSCULAR | Status: AC
  Administered 2020-03-01: 50 ug via INTRAVENOUS
  Filled 2020-03-01: qty 2

## 2020-03-01 MED ORDER — ONDANSETRON HCL 4 MG/2ML IJ SOLN
INTRAMUSCULAR | Status: AC
  Filled 2020-03-01: qty 2

## 2020-03-01 MED ORDER — PROPOFOL 10 MG/ML IV BOLUS
INTRAVENOUS | Status: AC
  Filled 2020-03-01: qty 20

## 2020-03-01 MED ORDER — FENTANYL CITRATE (PF) 100 MCG/2ML IJ SOLN
INTRAMUSCULAR | Status: AC
  Filled 2020-03-01: qty 2

## 2020-03-01 MED ORDER — LIDOCAINE HCL (CARDIAC) PF 100 MG/5ML IV SOSY
PREFILLED_SYRINGE | INTRAVENOUS | Status: DC | PRN
  Administered 2020-03-01: 100 mg via INTRAVENOUS

## 2020-03-01 MED ORDER — MIDAZOLAM HCL 2 MG/2ML IJ SOLN
INTRAMUSCULAR | Status: DC | PRN
  Administered 2020-03-01: 2 mg via INTRAVENOUS

## 2020-03-01 MED ORDER — ORAL CARE MOUTH RINSE: 15.0000 mL | Freq: Once | OROMUCOSAL | Status: AC

## 2020-03-01 MED ORDER — FENTANYL CITRATE (PF) 100 MCG/2ML IJ SOLN
INTRAMUSCULAR | Status: DC | PRN
  Administered 2020-03-01: 50 ug via INTRAVENOUS

## 2020-03-01 MED ORDER — FENTANYL CITRATE (PF) 100 MCG/2ML IJ SOLN
25.0000 ug | INTRAMUSCULAR | Status: DC | PRN
  Administered 2020-03-01: 50 ug via INTRAVENOUS

## 2020-03-01 MED ORDER — CHLORHEXIDINE GLUCONATE 0.12 % MT SOLN
OROMUCOSAL | Status: AC
  Administered 2020-03-01: 15 mL via OROMUCOSAL
  Filled 2020-03-01: qty 15

## 2020-03-01 MED ORDER — EPHEDRINE SULFATE 50 MG/ML IJ SOLN
INTRAMUSCULAR | Status: DC | PRN
  Administered 2020-03-01 (×2): 5 mg via INTRAVENOUS

## 2020-03-01 MED ORDER — MIDAZOLAM HCL 2 MG/2ML IJ SOLN
INTRAMUSCULAR | Status: AC
  Filled 2020-03-01: qty 2

## 2020-03-01 MED ORDER — CHLORHEXIDINE GLUCONATE 0.12 % MT SOLN: 15.0000 mL | Freq: Once | OROMUCOSAL | Status: AC

## 2020-03-01 MED ORDER — IOHEXOL 180 MG/ML  SOLN
INTRAMUSCULAR | Status: DC | PRN
  Administered 2020-03-01: 40 mL

## 2020-03-01 SURGICAL SUPPLY — 44 items
BAG DRAIN CYSTO-URO LG1000N (MISCELLANEOUS) ×3 IMPLANT
BAG DRN RND TRDRP ANRFLXCHMBR (UROLOGICAL SUPPLIES)
BAG URINE DRAIN 2000ML AR STRL (UROLOGICAL SUPPLIES) IMPLANT
BASKET ZERO TIP 1.9FR (BASKET) ×1 IMPLANT
BRUSH SCRUB EZ 1% IODOPHOR (MISCELLANEOUS) ×3 IMPLANT
BSKT STON RTRVL ZERO TP 1.9FR (BASKET) ×2
CATH FOL 2WAY LX 18X30 (CATHETERS) IMPLANT
CATH URETL 5X70 OPEN END (CATHETERS) IMPLANT
CNTNR SPEC 2.5X3XGRAD LEK (MISCELLANEOUS) ×2
CONT SPEC 4OZ STER OR WHT (MISCELLANEOUS) ×1
CONT SPEC 4OZ STRL OR WHT (MISCELLANEOUS) ×2
CONTAINER SPEC 2.5X3XGRAD LEK (MISCELLANEOUS) IMPLANT
DRAPE UTILITY 15X26 TOWEL STRL (DRAPES) ×3 IMPLANT
FIBER LASER FLEXIVA 1000 (UROLOGICAL SUPPLIES) IMPLANT
GLOVE BIOGEL PI IND STRL 7.5 (GLOVE) ×2 IMPLANT
GLOVE BIOGEL PI INDICATOR 7.5 (GLOVE) ×1
GOWN STRL REUS W/ TWL LRG LVL3 (GOWN DISPOSABLE) ×4 IMPLANT
GOWN STRL REUS W/ TWL XL LVL3 (GOWN DISPOSABLE) ×2 IMPLANT
GOWN STRL REUS W/TWL LRG LVL3 (GOWN DISPOSABLE) ×6
GOWN STRL REUS W/TWL XL LVL3 (GOWN DISPOSABLE) ×3
GUIDEWIRE STR DUAL SENSOR (WIRE) ×4 IMPLANT
INFUSOR MANOMETER BAG 3000ML (MISCELLANEOUS) ×3 IMPLANT
INTRODUCER DILATOR DOUBLE (INTRODUCER) IMPLANT
IV NS IRRIG 3000ML ARTHROMATIC (IV SOLUTION) ×8 IMPLANT
KIT PROBE TRILOGY 3.9X350 (MISCELLANEOUS) IMPLANT
KIT TURNOVER CYSTO (KITS) ×3 IMPLANT
PACK CYSTO AR (MISCELLANEOUS) ×3 IMPLANT
SET CYSTO W/LG BORE CLAMP LF (SET/KITS/TRAYS/PACK) ×3 IMPLANT
SET IRRIG Y TYPE TUR BLADDER L (SET/KITS/TRAYS/PACK) ×3 IMPLANT
SHEATH URETERAL 12FR 45CM (SHEATH) ×1 IMPLANT
SHEATH URETERAL 12FRX35CM (MISCELLANEOUS) IMPLANT
SOL .9 NS 3000ML IRR  AL (IV SOLUTION) ×3
SOL .9 NS 3000ML IRR AL (IV SOLUTION) ×2
SOL .9 NS 3000ML IRR UROMATIC (IV SOLUTION) ×2 IMPLANT
STENT URET 6FRX24 CONTOUR (STENTS) IMPLANT
STENT URET 6FRX26 CONTOUR (STENTS) ×1 IMPLANT
SURGILUBE 2OZ TUBE FLIPTOP (MISCELLANEOUS) ×3 IMPLANT
SYR TOOMEY IRRIG 70ML (MISCELLANEOUS) ×3
SYRINGE TOOMEY IRRIG 70ML (MISCELLANEOUS) ×2 IMPLANT
TRACTIP FLEXIVA PULS ID 200XHI (Laser) IMPLANT
TRACTIP FLEXIVA PULSE ID 200 (Laser) ×6 IMPLANT
VALVE UROSEAL ADJ ENDO (VALVE) ×1 IMPLANT
WATER STERILE IRR 1000ML POUR (IV SOLUTION) ×3 IMPLANT
WATER STERILE IRR 3000ML UROMA (IV SOLUTION) IMPLANT

## 2020-03-01 NOTE — Interval H&P Note (Signed)
History and Physical Interval Note: He has elected to proceed with ureteroscopic removal.  The procedure has been discussed in detail.  All questions were answered and he desires to proceed.  03/01/2020 11:33 AM  Stephen Hanna  has presented today for surgery, with the diagnosis of right nephrolithiasis, right ureteral calculus, bladder calculi.  The various methods of treatment have been discussed with the patient and family. After consideration of risks, benefits and other options for treatment, the patient has consented to  Procedure(s): CYSTOSCOPY/URETEROSCOPY/HOLMIUM LASER/STENT PLACEMENT (Right) CYSTOSCOPY WITH LITHOLAPAXY (N/A) as a surgical intervention.  The patient's history has been reviewed, patient examined, no change in status, stable for surgery.  I have reviewed the patient's chart and labs.  Questions were answered to the patient's satisfaction.     [name]

## 2020-03-01 NOTE — Transfer of Care (Signed)
Immediate Anesthesia Transfer of Care Note  Patient: Stephen Hanna  Procedure(s) Performed: CYSTOSCOPY/URETEROSCOPY/HOLMIUM LASER/STENT PLACEMENT (Right ) CYSTOSCOPY WITH LITHOLAPAXY (N/A ) CYSTOSCOPY WITH RETROGRADE PYELOGRAM (Right )  Patient Location: PACU  Anesthesia Type:General  Level of Consciousness: awake, drowsy and patient cooperative  Airway & Oxygen Therapy: Patient Spontanous Breathing and Patient connected to face mask oxygen  Post-op Assessment: Report given to RN  Post vital signs: Reviewed and stable  Last Vitals:  Vitals Value Taken Time  BP 139/88 03/01/20 1448  Temp 36.6 C 03/01/20 1448  Pulse 59 03/01/20 1452  Resp 16 03/01/20 1452  SpO2 100 % 03/01/20 1452  Vitals shown include unvalidated device data.  Last Pain:  Vitals:   03/01/20 1448  TempSrc:   PainSc: (P) Asleep         Complications: No complications documented.

## 2020-03-01 NOTE — Anesthesia Preprocedure Evaluation (Signed)
Anesthesia Evaluation  Patient identified by MRN, date of birth, ID band Patient awake    Reviewed: Allergy & Precautions, NPO status , Patient's Chart, lab work & pertinent test results  History of Anesthesia Complications Negative for: history of anesthetic complications  Airway Mallampati: III       Dental  (+) Dental Advidsory Given, Poor Dentition   Pulmonary neg sleep apnea, neg COPD, Not current smoker, former smoker,           Cardiovascular hypertension, (-) angina+ CAD  (-) Past MI and (-) CHF (-) dysrhythmias (-) Valvular Problems/Murmurs     Neuro/Psych neg Seizures    GI/Hepatic Neg liver ROS, GERD (rare)  ,  Endo/Other  neg diabetes  Renal/GU Renal disease (stones)     Musculoskeletal   Abdominal   Peds  Hematology   Anesthesia Other Findings Past Medical History: No date: Arthritis     Comment:  LEFT SHOULDER No date: Back pain, chronic     Comment:  after fall from ladder in 1980s with episodic flares No date: BPH (benign prostatic hyperplasia) 10/2010: Diverticulitis     Comment:  divertics one small polyp (Dr. Fuller Plan) No date: GERD (gastroesophageal reflux disease)     Comment:  RARE No date: History of kidney stones No date: Hyperglycemia No date: Hyperlipidemia No date: Hypertension     Comment:  H/O No date: IBS (irritable bowel syndrome)     Comment:  diarrhea predominant 02/07/2005: Personal history of kidney stones     Comment:  CT ABD/pelvis --Diverticulitis sig left renal stone //               CT ABD w/o negative stones + multiple aortic L.N.               07/24/2004//   Reproductive/Obstetrics                             Anesthesia Physical  Anesthesia Plan  ASA: II  Anesthesia Plan: General   Post-op Pain Management:    Induction: Intravenous  PONV Risk Score and Plan: 2 and Ondansetron, Dexamethasone, Midazolam and Treatment may vary due to  age or medical condition  Airway Management Planned: LMA  Additional Equipment:   Intra-op Plan:   Post-operative Plan: Extubation in OR  Informed Consent: I have reviewed the patients History and Physical, chart, labs and discussed the procedure including the risks, benefits and alternatives for the proposed anesthesia with the patient or authorized representative who has indicated his/her understanding and acceptance.       Plan Discussed with:   Anesthesia Plan Comments:         Anesthesia Quick Evaluation

## 2020-03-01 NOTE — Telephone Encounter (Signed)
-----   Message from Abbie Sons, MD sent at 03/01/2020  3:30 PM EDT ----- Regarding: Stent removal Please schedule cystoscopy with stent removal in approximately 7-10 days.  Thanks

## 2020-03-01 NOTE — Discharge Instructions (Signed)
DISCHARGE INSTRUCTIONS FOR KIDNEY STONE/URETERAL STENT   MEDICATIONS:  1. Resume all your other meds from home.  2.  Uribel (Rx) or AZO (over-the-counter) can help with the burning/stinging when you urinate.  Rx Uribel sent to pharmacy 3.  Oxycodone is for moderate/severe pain, Rx was sent to your pharmacy. 4.  Tamsulosin can also help stent irritation.  Rx sent to pharmacy  ACTIVITY:  1. May resume regular activities in 24 hours. 2. No driving while on narcotic pain medications  3. Drink plenty of water  4. Continue to walk at home - you can still get blood clots when you are at home, so keep active, but don't over do it.  5. May return to work/school tomorrow or when you feel ready    SIGNS/SYMPTOMS TO CALL:  Please call us if you have a fever greater than 101.5, uncontrolled nausea/vomiting, uncontrolled pain, dizziness, unable to urinate, excessively bloody urine, chest pain, shortness of breath, leg swelling, leg pain, or any other concerns or questions.   Common postoperative symptoms include urinary frequency, urgency, burning and blood in the urine  You can reach [place] at [phone].   FOLLOW-UP:  1. You we will be contacted for an appointment for stent removal next week  [place]   1) The drugs that you were given will stay in your system until tomorrow so for the next 24 hours you should not:  A) Drive an automobile B) Make any legal decisions C) Drink any alcoholic beverage   2) You may resume regular meals tomorrow.  Today it is better to start with liquids and gradually work up to solid foods.  You may eat anything you prefer, but it is better to start with liquids, then soup and crackers, and gradually work up to solid foods.   3) Please notify your doctor immediately if you have any unusual bleeding, trouble breathing, redness and pain at the surgery site, drainage, fever, or pain not relieved by  medication.    4) Additional Instructions:    Please contact your physician with any problems or Same Day Surgery at [phone], Monday through Friday 6 am to 4 pm, or [place] at [facility] number at [phone].

## 2020-03-01 NOTE — Telephone Encounter (Signed)
App made gave app to Eve in pacu

## 2020-03-01 NOTE — Anesthesia Procedure Notes (Signed)
Procedure Name: LMA Insertion Date/Time: 03/01/2020 1:11 PM Performed by: Rona Ravens, CRNA Pre-anesthesia Checklist: Patient identified, Emergency Drugs available, Suction available, Patient being monitored and Timeout performed Patient Re-evaluated:Patient Re-evaluated prior to induction Oxygen Delivery Method: Circle system utilized Preoxygenation: Pre-oxygenation with 100% oxygen Induction Type: IV induction Ventilation: Mask ventilation without difficulty LMA: LMA inserted LMA Size: 5.0 Number of attempts: 1 Tube secured with: Tape Dental Injury: Teeth and Oropharynx as per pre-operative assessment

## 2020-03-01 NOTE — Op Note (Signed)
Preoperative diagnosis:  1. Right proximal ureteral calculus 2. Right nephrolithiasis 3. Bladder calculi  Postoperative diagnosis:  1. Same  Procedure:  1. Cystoscopy 2. Right ureteroscopy and stone removal 3. Ureteroscopic laser lithotripsy 4. Cystolitholapaxy (< 2.5 cm) 5. Right ureteral stent placement (6FR/26 cm) 6. Right retrograde pyelography with interpretation  Surgeon: Nicki Reaper C. Reason Helzer, M.D.  Anesthesia: General  Complications: None  Intraoperative findings:  1. Cystoscopy-normal in caliber without stricture; prominent lateral lobe enlargement with prostatic calculi and mild bladder neck elevation; 3 bladder calculi noted the largest measuring approximately 1 cm, a second measuring >5 mm and a third measuring ~2 mm 2. Ureteroscopy-10 mm right proximal ureteral calculus embedded into the ureteral mucosa circumferentially with marked ureteral edema; 5 mm right lower calyceal calculus 3. Right retrograde pyelogram post procedure showed no filling defects or contrast extravasation  EBL: Minimal  Specimens: 1. Calculus fragments for analysis   Indication: Stephen Hanna is a 64 y.o. male with a 10 mm right proximal ureteral calculus with intermittent right flank pain.  He also has nonobstructing right renal calculi and bladder calculi, the largest measuring 10 mm.  After reviewing the management options for treatment, the patient elected to proceed with the above surgical procedure(s). We have discussed the potential benefits and risks of the procedure, side effects of the proposed treatment, the likelihood of the patient achieving the goals of the procedure, and any potential problems that might occur during the procedure or recuperation. Informed consent has been obtained.  Description of procedure:  The patient was taken to the operating room and general anesthesia was induced.  The patient was placed in the dorsal lithotomy position, prepped and draped in the usual  sterile fashion, and preoperative antibiotics were administered. A preoperative time-out was performed.   A 21 French cystoscope was lubricated, passed per urethra and advanced proximally into the bladder with findings as described above  Attention was directed to the right ureteral orifice and a 0.038 Sensor wire was then advanced up the ureter past the proximal ureteral calculus into the renal pelvis under fluoroscopic guidance.  The cystoscope was removed.  A 4.5 Fr semirigid ureteroscope was then advanced into the ureter next to the guidewire and the calculus was identified in the lower proximal ureter with findings as described above.  The stone was then dusted with a 200 micron holmium laser fiber on a setting of 0.2 J / 40 Hz.  Few small fragments migrated proximally.  A second Sensor wire was placed through the semirigid ureteroscope and the ureteroscope was removed.  A single channel digital flexible ureteroscope was then advanced over the wire however could not be negotiated beyond the first few centimeters of the distal ureter.  A 12/14 French ureteral access sheath was then advanced over the working wire without difficulty into the region of the mid ureter.  The flexible ureteroscope was then placed into the access sheath and advanced to the site of the proximal ureteral calculus.  A few 2 mm fragments were removed via a 1.9 [place] nitinol basket.  No additional fragments were seen in the proximal ureter and the access sheath was advanced over the ureteroscope without difficulty.  A few small fragments were also identified in the renal pelvis and were removed with the basket.  All calyces were examined and several calyces contain Randall's plaques.  A 5 mm calculus was identified in the lower pole calyx and placed within the nitinol basket however resistance was met in the proximal ureter.  The calculus was relocated in a midpole calyx and dusted down to a 3 mm size and subsequently removed  and sent for analysis.  Right retrograde pyelograms performed with findings as described above.  All calyces were examined and no additional calculi or fragments were identified.  The flexible ureteroscope and access sheath were removed in tandem and no additional fragments or ureteral mucosal abnormalities/perforation were noted.  The ureteroscope was removed and the 21 French cystoscope was repassed.  The 1 cm fragment was dusted with a 200 m holmium laser fiber at settings of 0.4 J / 40 Hz.  The < 5 mm fragment was also treated in a similar fashion.  All fragments were removed via irrigation.  No additional fragments were identified.  The cystoscope was removed and a 6FR/26 cm Contour ureteral stent was placed over the Sensor wire under fluoroscopic guidance. There was good curl noted in the renal pelvis and bladder under fluoroscopy.  After anesthetic reversal the patient was transported to the PACU in stable condition.  Plan: He will be scheduled for cystoscopy with stent removal in 7-10 days.   Stephen Giovanni, MD

## 2020-03-01 NOTE — Anesthesia Postprocedure Evaluation (Signed)
Anesthesia Post Note  Patient: Stephen Hanna  Procedure(s) Performed: CYSTOSCOPY/URETEROSCOPY/HOLMIUM LASER/STENT PLACEMENT (Right ) CYSTOSCOPY WITH LITHOLAPAXY (N/A ) CYSTOSCOPY WITH RETROGRADE PYELOGRAM (Right )  Patient location during evaluation: PACU Anesthesia Type: General Level of consciousness: awake and alert and oriented Pain management: pain level controlled Vital Signs Assessment: post-procedure vital signs reviewed and stable Respiratory status: spontaneous breathing, nonlabored ventilation and respiratory function stable Cardiovascular status: blood pressure returned to baseline and stable Postop Assessment: no signs of nausea or vomiting Anesthetic complications: no   No complications documented.   Last Vitals:  Vitals:   03/01/20 1533 03/01/20 1537  BP:  (!) 150/89  Pulse: (!) 54 (!) 55  Resp: 15 17  Temp:    SpO2: 98% 98%    Last Pain:  Vitals:   03/01/20 1518  TempSrc:   PainSc: 2                  Uldine Fuster

## 2020-03-02 ENCOUNTER — Encounter: Payer: Self-pay | Admitting: Urology

## 2020-03-04 LAB — CALCULI, WITH PHOTOGRAPH (CLINICAL LAB)
Calcium Oxalate Dihydrate: 10 %
Calcium Oxalate Monohydrate: 90 %
Weight Calculi: 27 mg

## 2020-03-08 ENCOUNTER — Encounter: Payer: Medicare Other | Admitting: Family Medicine

## 2020-03-09 ENCOUNTER — Ambulatory Visit (INDEPENDENT_AMBULATORY_CARE_PROVIDER_SITE_OTHER): Payer: Medicare Other | Admitting: Urology

## 2020-03-09 ENCOUNTER — Encounter: Payer: Self-pay | Admitting: Urology

## 2020-03-09 ENCOUNTER — Other Ambulatory Visit: Payer: Self-pay

## 2020-03-09 ENCOUNTER — Encounter: Payer: Medicare Other | Admitting: Surgery

## 2020-03-09 VITALS — BP 148/87 | HR 80 | Ht 69.0 in | Wt 207.0 lb

## 2020-03-09 DIAGNOSIS — N201 Calculus of ureter: Secondary | ICD-10-CM

## 2020-03-09 DIAGNOSIS — N200 Calculus of kidney: Secondary | ICD-10-CM

## 2020-03-09 MED ORDER — CIPROFLOXACIN HCL 500 MG PO TABS
500.0000 mg | ORAL_TABLET | Freq: Once | ORAL | Status: AC
  Administered 2020-03-09: 500 mg via ORAL

## 2020-03-09 NOTE — Progress Notes (Signed)
Indications: Patient is 64 y.o., who is s/p ureteroscopic removal of an 10 mm right proximal ureteral calculus, 5 mm renal calculus and cystolitholapaxy of 3 bladder calculi on 03/01/2020.  He is having moderate stent irritative symptoms.  The patient is presenting today for stent removal.  Procedure:  Flexible Cystoscopy with stent removal (84665)  Timeout was performed and the correct patient, procedure and participants were identified.    Description:  The patient was prepped and draped in the usual sterile fashion. Flexible cystosopy was performed.  The stent was visualized, grasped, and removed intact without difficulty. The patient tolerated the procedure well.  A single dose of oral antibiotics was given.  Complications:  None  Plan:   Instructed to call for fever/flank pain post stent removal  Follow-up with renal ultrasound 3 months   Stone analysis 90% CaOxM/10% CaOxD  He also has nonobstructing left renal calculi and have recommended a metabolic evaluation   [name], MD

## 2020-03-09 NOTE — Patient Instructions (Signed)

## 2020-03-09 NOTE — Addendum Note (Signed)
Addended by: Chrystie Nose on: 03/09/2020 03:37 PM   Modules accepted: Orders

## 2020-03-11 LAB — MICROSCOPIC EXAMINATION: RBC, Urine: >30 /hpf — AB (ref 0–2)

## 2020-03-11 LAB — URINALYSIS, COMPLETE
Bilirubin, UA: NEGATIVE
Glucose, UA: NEGATIVE
Ketones, UA: NEGATIVE
Nitrite, UA: POSITIVE — AB
Specific Gravity, UA: 1.02 (ref 1.005–1.030)
Urobilinogen, Ur: 0.2 mg/dL (ref 0.2–1.0)
pH, UA: 5.5 (ref 5.0–7.5)

## 2020-03-14 ENCOUNTER — Telehealth: Payer: Self-pay | Admitting: *Deleted

## 2020-03-14 NOTE — Telephone Encounter (Signed)
Contacted in attempt to schedule annual lung screening scan. Patient refuses lung screening at this time. He reports he will contact me if he wants to schedule scan after his next appt with his PCP.

## 2020-03-14 NOTE — Telephone Encounter (Signed)
Noted. Thanks.

## 2020-03-23 ENCOUNTER — Other Ambulatory Visit: Payer: Medicare Other

## 2020-03-23 ENCOUNTER — Other Ambulatory Visit: Payer: Self-pay

## 2020-03-23 DIAGNOSIS — N200 Calculus of kidney: Secondary | ICD-10-CM | POA: Diagnosis not present

## 2020-03-30 ENCOUNTER — Other Ambulatory Visit (INDEPENDENT_AMBULATORY_CARE_PROVIDER_SITE_OTHER): Payer: Medicare Other

## 2020-03-30 ENCOUNTER — Other Ambulatory Visit: Payer: Self-pay

## 2020-03-30 DIAGNOSIS — I2510 Atherosclerotic heart disease of native coronary artery without angina pectoris: Secondary | ICD-10-CM

## 2020-03-30 DIAGNOSIS — Z125 Encounter for screening for malignant neoplasm of prostate: Secondary | ICD-10-CM | POA: Diagnosis not present

## 2020-03-30 DIAGNOSIS — I2584 Coronary atherosclerosis due to calcified coronary lesion: Secondary | ICD-10-CM | POA: Diagnosis not present

## 2020-03-30 LAB — LIPID PANEL
Cholesterol: 139 mg/dL (ref 0–200)
HDL: 39.7 mg/dL (ref >39.00)
LDL Cholesterol: 77 mg/dL (ref 0–99)
NonHDL: 98.88
Total CHOL/HDL Ratio: 3
Triglycerides: 110 mg/dL (ref 0.0–149.0)
VLDL: 22 mg/dL (ref 0.0–40.0)

## 2020-03-30 LAB — BASIC METABOLIC PANEL
BUN: 16 mg/dL (ref 6–23)
CO2: 31 mEq/L (ref 19–32)
Calcium: 9.1 mg/dL (ref 8.4–10.5)
Chloride: 105 mEq/L (ref 96–112)
Creatinine, Ser: 0.98 mg/dL (ref 0.40–1.50)
GFR: 80.32 mL/min (ref >60.00)
Glucose, Bld: 98 mg/dL (ref 70–99)
Potassium: 4.7 mEq/L (ref 3.5–5.1)
Sodium: 140 mEq/L (ref 135–145)

## 2020-03-30 LAB — PSA, MEDICARE: PSA: 1.88 ng/ml (ref 0.10–4.00)

## 2020-04-05 ENCOUNTER — Ambulatory Visit (INDEPENDENT_AMBULATORY_CARE_PROVIDER_SITE_OTHER): Payer: Medicare Other | Admitting: Family Medicine

## 2020-04-05 ENCOUNTER — Encounter: Payer: Self-pay | Admitting: Family Medicine

## 2020-04-05 ENCOUNTER — Other Ambulatory Visit: Payer: Self-pay

## 2020-04-05 VITALS — BP 144/82 | HR 66 | Temp 98.6°F | Ht 74.0 in | Wt 212.0 lb

## 2020-04-05 DIAGNOSIS — I2510 Atherosclerotic heart disease of native coronary artery without angina pectoris: Secondary | ICD-10-CM

## 2020-04-05 DIAGNOSIS — Z7189 Other specified counseling: Secondary | ICD-10-CM

## 2020-04-05 DIAGNOSIS — I2584 Coronary atherosclerosis due to calcified coronary lesion: Secondary | ICD-10-CM | POA: Diagnosis not present

## 2020-04-05 DIAGNOSIS — Z0000 Encounter for general adult medical examination without abnormal findings: Secondary | ICD-10-CM

## 2020-04-05 DIAGNOSIS — Z23 Encounter for immunization: Secondary | ICD-10-CM

## 2020-04-05 NOTE — Patient Instructions (Addendum)
Don't change your meds for now.   Keep working on diet and exercise.  Flu shot today.  Take care.  Glad to see you. Let me know when you need refills.

## 2020-04-05 NOTE — Progress Notes (Signed)
This visit occurred during the SARS-CoV-2 public health emergency.  Safety protocols were in place, including screening questions prior to the visit, additional usage of staff PPE, and extensive cleaning of exam room while observing appropriate contact time as indicated for disinfecting solutions.  History of coronary artery calcification/elevated Cholesterol: Using medications without problems:yes Muscle aches: able to tolerate 20mg  lipitor w/o ADE.   Diet compliance: yes Exercise: yes Labs d/w pt. LDL slightly above 70 and that is likely affected by prev surgery/exercise changes.   No chest pain.  He is awaiting f/u test results from urology re: kidney stones.  He had procedures this year regarding kidney stones, diaphragmatic hernia and his abdominal wall.  Flu 2021 Shingles discussed with patient PNA 2020 Tetanus 2016 covid 2021 Colonoscopy 2016 Prostate cancer screening 2021 Advance directive-wife designated if patient were incapacitated. AAA screening neg 2020 Prev lung cancer screening protocol d/wpt.  He wanted to defer this year given the prev CT in Sep 10, 2022.    His father in law died recently, condolences offered.    Meds, vitals, and allergies reviewed.   PMH and SH reviewed  ROS: Per HPI unless specifically indicated in ROS section   GEN: nad, alert and oriented HEENT: NCAT NECK: supple w/o LA CV: rrr. PULM: ctab, no inc wob ABD: soft, +bs, abdominal surgical sites healed normally. EXT: no edema SKIN: no acute rash

## 2020-04-06 DIAGNOSIS — Z0000 Encounter for general adult medical examination without abnormal findings: Secondary | ICD-10-CM | POA: Insufficient documentation

## 2020-04-06 NOTE — Assessment & Plan Note (Signed)
Advance directive- wife designated if patient were incapacitated.  

## 2020-04-06 NOTE — Assessment & Plan Note (Signed)
Flu 2021 Shingles discussed with patient PNA 2020 Tetanus 2016 covid 2021 Colonoscopy 2016 Prostate cancer screening 2021 Advance directive-wife designated if patient were incapacitated. AAA screening neg 2020 Prev lung cancer screening protocol d/wpt.  He wanted to defer this year given the prev CT in 4/21.

## 2020-04-06 NOTE — Assessment & Plan Note (Signed)
Labs d/w pt. LDL slightly above 70 and that is likely affected by prev surgery/exercise changes.  Reasonable to continue Lipitor 20 mg and he will continue work on diet and exercise.  We can recheck this periodically.  He will update me as needed.

## 2020-04-07 ENCOUNTER — Other Ambulatory Visit: Payer: Self-pay | Admitting: Urology

## 2020-04-08 IMAGING — CT CT RENAL STONE PROTOCOL
2 of 4 series · 14 of 46 positions shown, 16 images · non-contrast
Comparison: Remote abdominal CT 11/26/2008

CLINICAL DATA: Sudden onset of right flank pain while mowing the
grass. History of kidney stones

EXAM:
CT ABDOMEN AND PELVIS WITHOUT CONTRAST
TECHNIQUE: Multidetector CT imaging of the abdomen and pelvis was performed
following the standard protocol without IV contrast.

[Series 2: stone full standard · axial · 0.79mm/px · z∈[-970,-520]mm · 11 of 105 slices shown, 13 images]
[im 10/105  soft-tissue]
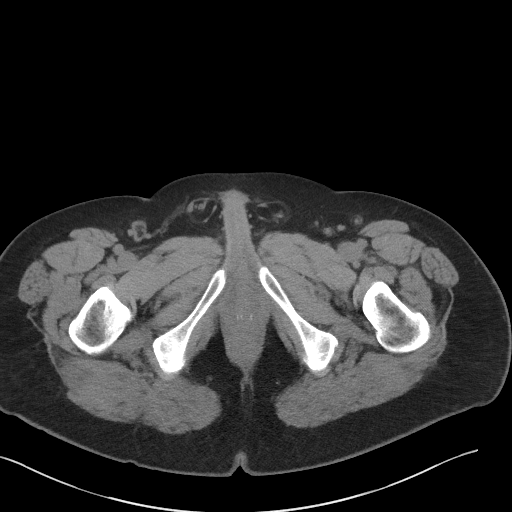
[im 10/105  bone]
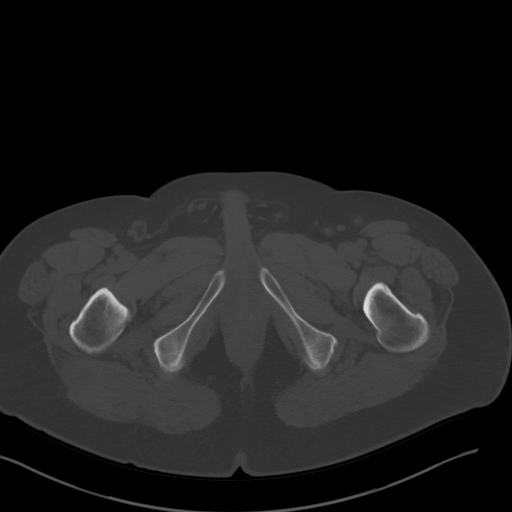
[im 19/105  soft-tissue]
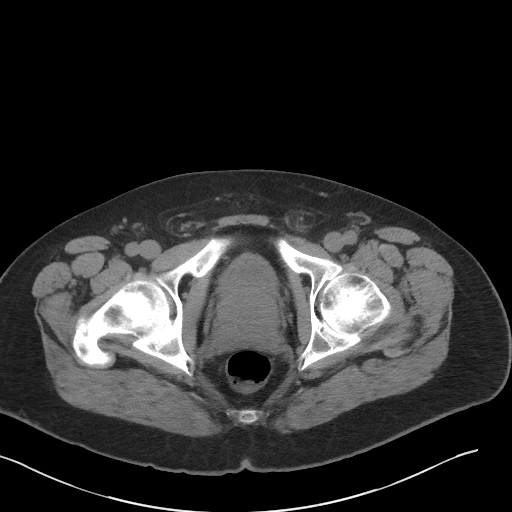
[im 28/105  soft-tissue]
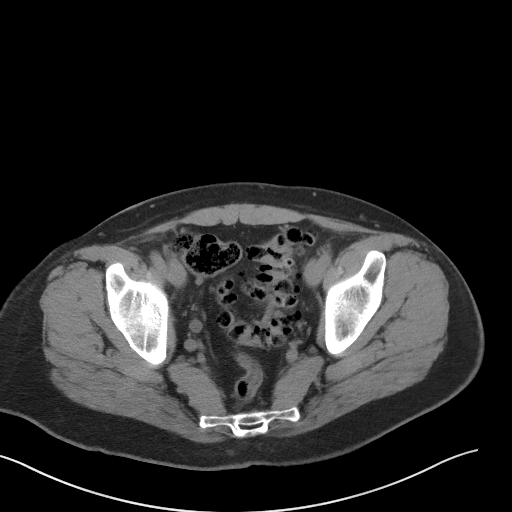
[im 37/105  soft-tissue]
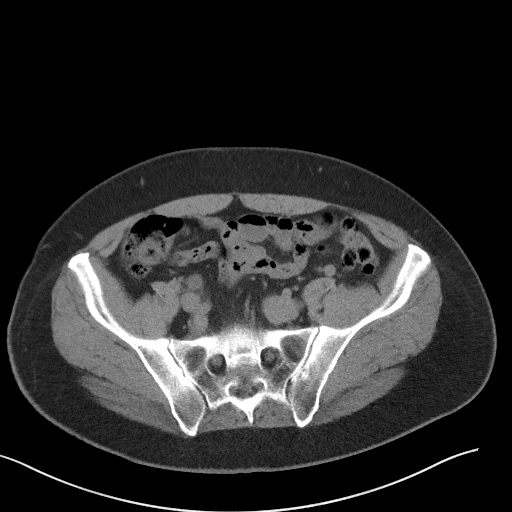
[im 46/105  soft-tissue]
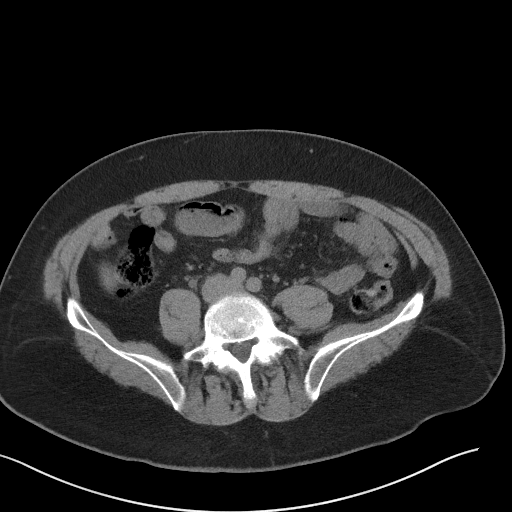
[im 55/105  soft-tissue]
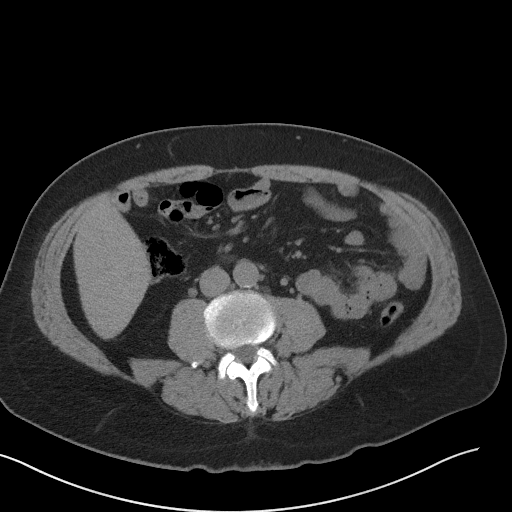
[im 64/105  soft-tissue]
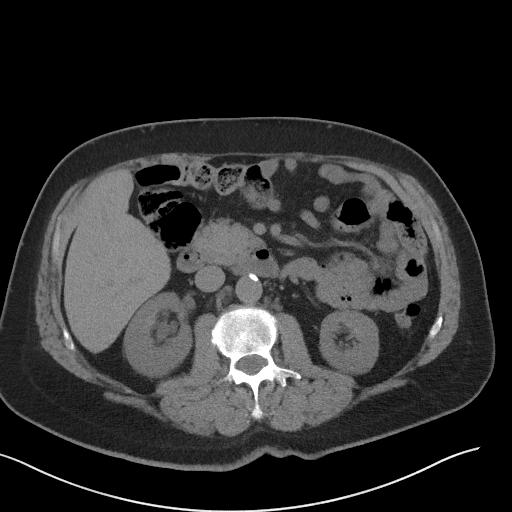
[im 73/105  soft-tissue]
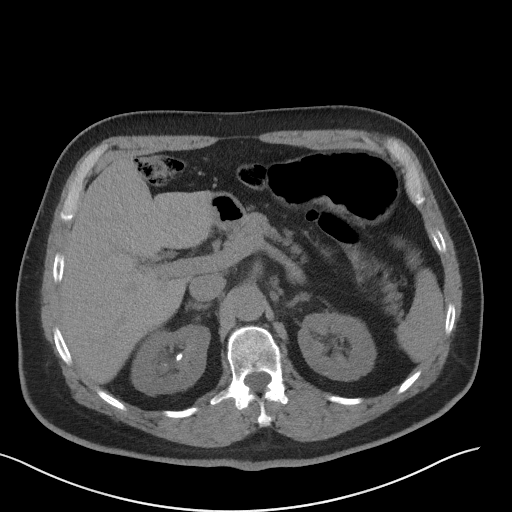
[im 82/105  soft-tissue]
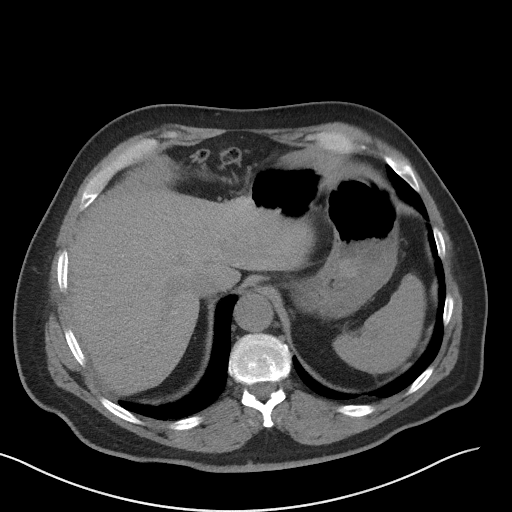
[im 82/105  bone]
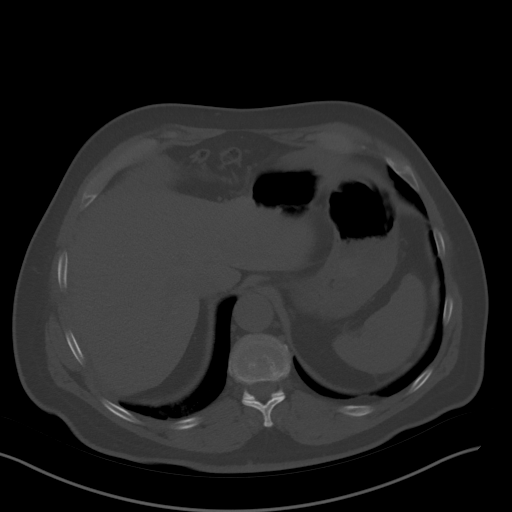
[im 91/105  soft-tissue]
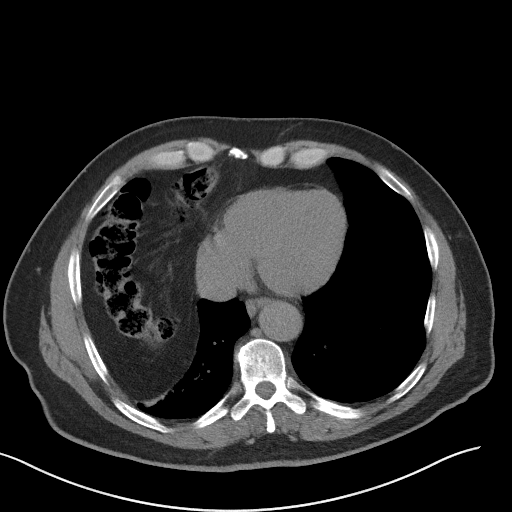
[im 100/105  soft-tissue]
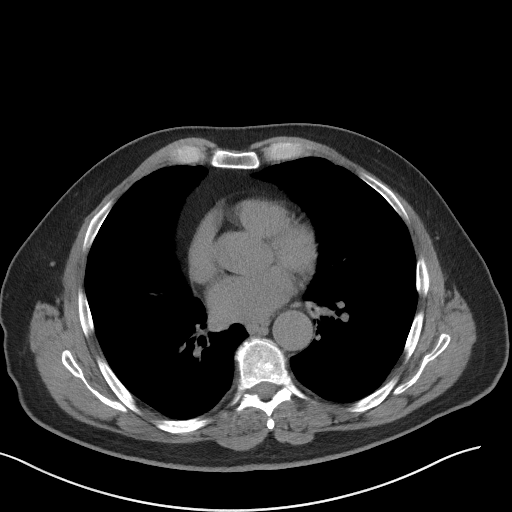

[Series 5: coronal · coronal · 0.77mm/px · 3 of 142 slices shown]
[im 48/142  soft-tissue]
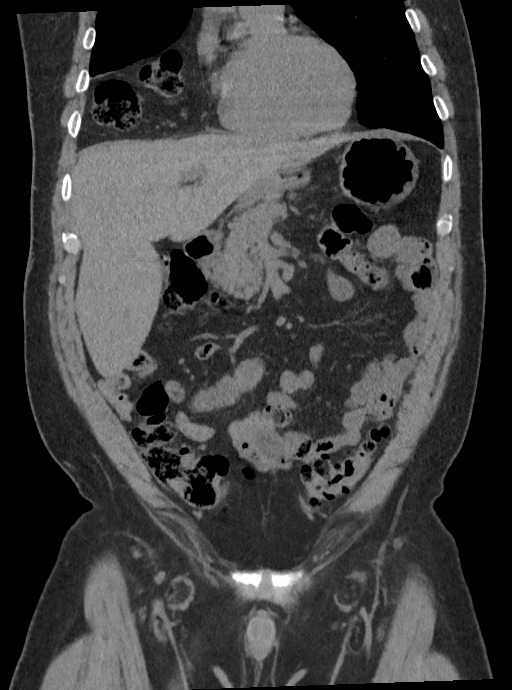
[im 63/142  soft-tissue]
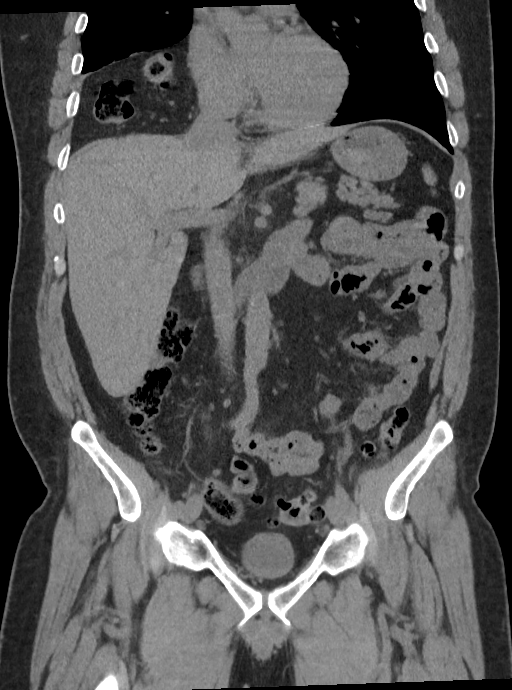
[im 79/142  soft-tissue]
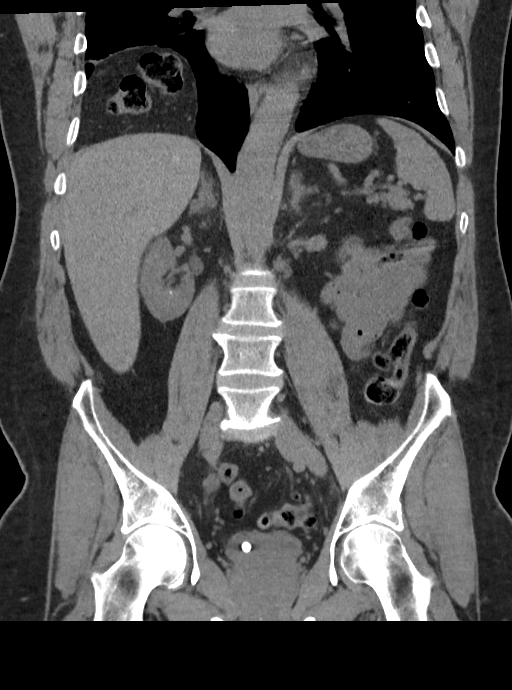

[14 of 46 positions shown; findings below may reference images not displayed]

FINDINGS: Lower chest: Anterior diaphragmatic hernia just to the right of
midline with herniation of omental fat and hepatic flexure of the
colon, unchanged from chest CT 03/12/2019. Hernia defect best
appreciated on coronal and sagittal reformats, series 5, image 25
and series 6, image 83. Mild adjacent atelectasis or scarring in the
right lung base.

Hepatobiliary: Prominent liver spanning 20 cm cranial caudal. No
evidence of focal lesion on noncontrast exam. Clips in the
gallbladder fossa postcholecystectomy. No biliary dilatation.

Pancreas: No ductal dilatation or inflammation.

Spleen: Normal in size without focal abnormality.

Adrenals/Urinary Tract: Bilateral adrenal thickening without
dominant nodule.

Moderate right hydroureteronephrosis. There are 2 adjacent stones at
the right ureterovesicular junction with more proximal stone
measuring 3 x 3 mm and adjacent stone measuring 10 x 7 mm. There are
3 nonobstructing stones in the right kidney largest in the upper
pole measuring 6 mm. Mild right perinephric edema.

Two punctate nonobstructing stones in the left kidney. No left
hydronephrosis. The left ureter is decompressed. Small low-density
in the upper left kidney is incompletely characterized on
noncontrast exam but may represent focal scarring when compared with
prior.

Urinary bladder is minimally distended. Bladder wall thickening may
be related to known extension.

Stomach/Bowel: Stomach is unremarkable. Normal positioning of the
ligament of [place]. No small bowel obstruction or inflammation.
Appendix is normal. Hepatic flexure colon courses through an
anterior diaphragmatic defect into the lower thorax. No obstruction
or inflammatory change. Moderate diverticulosis of the distal
descending and sigmoid colon. No acute diverticulitis.

Vascular/Lymphatic: Mild aortic atherosclerosis. No aortic aneurysm.
No bulky abdominopelvic lymph nodes.

Reproductive: Enlarged prostate gland spanning 5.6 cm transverse
causing mass effect on the bladder base.

Other: Fat within both inguinal canals. Tiny fat containing
umbilical hernia. No free air free fluid. Anterior diaphragmatic
hernia as described above.

Musculoskeletal: There are no acute or suspicious osseous
abnormalities. Degenerative change in the lumbar spine is most
prominent at L5-S1. There is a bone island within posterior elements
of left L4.
IMPRESSION: 1. Moderate right hydroureteronephrosis with 2 adjacent stones at
the right ureterovesicular junction. Proximal stone measures 3.3 mm,
with adjacent larger stone 10 x 7 mm.
2. Additional nonobstructing stones in both kidneys.
3. Colonic diverticulosis without diverticulitis.
4. Right right anterior diaphragmatic hernia with herniation of
omental fat and hepatic flexure of the colon, unchanged from prior
lung cancer screening chest CT March 2019. No evidence of
obstruction or inflammation. Consider elective surgical evaluation.
5. Enlarged prostate gland causing mass effect on the bladder base.

Aortic Atherosclerosis ([id number]).

## 2020-04-14 ENCOUNTER — Other Ambulatory Visit: Payer: Self-pay

## 2020-04-14 ENCOUNTER — Encounter: Payer: Self-pay | Admitting: Urology

## 2020-04-14 ENCOUNTER — Ambulatory Visit: Payer: Medicare Other | Admitting: Urology

## 2020-04-14 VITALS — BP 159/95 | HR 65 | Ht 74.0 in | Wt 212.0 lb

## 2020-04-14 DIAGNOSIS — N200 Calculus of kidney: Secondary | ICD-10-CM

## 2020-04-14 DIAGNOSIS — R82994 Hypercalciuria: Secondary | ICD-10-CM

## 2020-04-14 MED ORDER — INDAPAMIDE 2.5 MG PO TABS: 2.5000 mg | ORAL_TABLET | Freq: Every day | ORAL | 3 refills | Status: DC

## 2020-04-14 NOTE — Progress Notes (Signed)
04/14/2020 1:32 PM   Stephen Hanna 1955/05/23 [id number]  Referring provider: Tonia Ghent, MD [address] [place],  [place] [address]  Chief Complaint  Patient presents with  . Other    HPI: 64 y.o. male presents for review of recent metabolic evaluation for recurrent stone disease.   Episode of mild right flank pain 1.5 weeks ago which resolved and has not recurred  Metabolic evaluation performed mid-November remarkable for normal blood work with a calcium level 9.1  Urine volume excellent at 2.62 L; hypercalciuria at 382 mg per 24 hours; calcium oxalate supersaturation was within normal range at 4.77   PMH: Past Medical History:  Diagnosis Date  . Arthritis    LEFT SHOULDER  . Back pain, chronic    after fall from ladder in 1980s with episodic flares  . BPH (benign prostatic hyperplasia)   . Diverticulitis 10/2010   divertics one small polyp (Dr. Fuller Plan)  . GERD (gastroesophageal reflux disease)    RARE  . History of kidney stones   . Hyperglycemia   . Hyperlipidemia   . Hypertension    H/O  . IBS (irritable bowel syndrome)    diarrhea predominant  . Personal history of kidney stones 02/07/2005   CT ABD/pelvis --Diverticulitis sig left renal stone // CT ABD w/o negative stones + multiple aortic L.N. 07/24/2004//    Surgical History: Past Surgical History:  Procedure Laterality Date  . CHOLECYSTECTOMY    . CYSTOSCOPY W/ RETROGRADES Right 03/01/2020   Procedure: CYSTOSCOPY WITH RETROGRADE PYELOGRAM;  Surgeon: Abbie Sons, MD;  Location: [facility];  Service: Urology;  Laterality: Right;  . CYSTOSCOPY WITH LITHOLAPAXY N/A 03/01/2020   Procedure: CYSTOSCOPY WITH LITHOLAPAXY;  Surgeon: Abbie Sons, MD;  Location: [facility];  Service: Urology;  Laterality: N/A;  . CYSTOSCOPY/URETEROSCOPY/HOLMIUM LASER/STENT PLACEMENT Right 03/01/2020   Procedure: CYSTOSCOPY/URETEROSCOPY/HOLMIUM LASER/STENT PLACEMENT;  Surgeon: Abbie Sons, MD;   Location: [facility];  Service: Urology;  Laterality: Right;  . LAPAROSCOPIC CHOLECYSTECTOMY W/ CHOLANGIOGRAPHY  12/27/2008   Ct abd/pelvis w/o no gallstones mild dilation entire right ureter lap choley (Dr. Bary Castilla)  . VASECTOMY  1990  . XI ROBOTIC ASSISTED PARAESOPHAGEAL HERNIA REPAIR N/A 09/03/2019   Procedure: XI ROBOTIC ASSISTED Diaphragmatic hernia REPAIR;  Surgeon: Jules Husbands, MD;  Location: [facility];  Service: General;  Laterality: N/A;  . XI ROBOTIC ASSISTED VENTRAL HERNIA N/A 02/04/2020   Procedure: XI ROBOTIC ASSISTED VENTRAL HERNIA;  Surgeon: Jules Husbands, MD;  Location: [facility];  Service: General;  Laterality: N/A;    Home Medications:  Allergies as of 04/14/2020      Reactions   Atorvastatin    Aches at 30mg  but not 20mg  a day.    Chantix [varenicline]    Abnormal dreams   Hydrocodone-acetaminophen Other (See Comments)   edgy, intolerant   Lisinopril Other (See Comments)   cough      Medication List       Accurate as of April 14, 2020  1:32 PM. If you have any questions, ask your nurse or doctor.        atorvastatin 20 MG tablet Commonly known as: LIPITOR Take 20 mg by mouth daily.   naproxen 250 MG tablet Commonly known as: NAPROSYN Take 250 mg by mouth daily.   Nasal Spray Moisturizing 12 HR 0.05 % nasal spray Generic drug: oxymetazoline Place 1 spray into both nostrils 2 (two) times daily as needed for congestion. D& J nasal spray   psyllium  58.6 % packet Commonly known as: METAMUCIL Take 1 packet by mouth daily.   vitamin B-12 1000 MCG tablet Commonly known as: CYANOCOBALAMIN Take 1,000 mcg by mouth daily.       Allergies:  Allergies  Allergen Reactions  . Atorvastatin     Aches at 30mg  but not 20mg  a day.   . Chantix [Varenicline]     Abnormal dreams  . Hydrocodone-Acetaminophen Other (See Comments)    edgy, intolerant  . Lisinopril Other (See Comments)    cough    Family History: Family History  Problem Relation Age of Onset   . Heart disease Father 63       MI  . Kidney disease Father   . Depression Maternal Grandmother   . Alcohol abuse Neg Hx   . Stroke Neg Hx   . Prostate cancer Neg Hx   . Colon cancer Neg Hx     Social History:  reports that he quit smoking about 5 years ago. His smoking use included cigarettes. He has a 30.00 pack-year smoking history. He has never used smokeless tobacco. He reports current alcohol use of about 1.0 standard drink of alcohol per week. He reports that he does not use drugs.   Physical Exam: BP (!) 159/95   Pulse 65   Ht 6\' 2"  (1.88 m)   Wt 212 lb (96.2 kg)   BMI 27.22 kg/m   Constitutional:  Alert and oriented, No acute distress.   Assessment & Plan:    1.  Hypercalciuria  We discussed the most common cause of hypercalciuria is renal leak and discussed the role of thiazide diuretics and treatment  His urine output was excellent and likely offsetting his hypercalciuria as his supersaturation of calcium oxalate is within the normal range  He would like to start medical therapy and Rx indapamide sent to pharmacy  Potential side effects were discussed including hyperglycemia, hypokalemia  Lab visit 6 weeks for glucose and potassium levels  Office visit with KUB 3 months  Repeat 24-hour urine study in 6 months  2.  Recurrent stone disease   Abbie Sons, MD  [place] [address] [place], [place] [address] [phone]

## 2020-05-11 ENCOUNTER — Encounter: Payer: Self-pay | Admitting: *Deleted

## 2020-05-23 ENCOUNTER — Other Ambulatory Visit: Payer: Self-pay | Admitting: *Deleted

## 2020-05-23 DIAGNOSIS — Z122 Encounter for screening for malignant neoplasm of respiratory organs: Secondary | ICD-10-CM

## 2020-05-23 DIAGNOSIS — Z87891 Personal history of nicotine dependence: Secondary | ICD-10-CM

## 2020-05-23 NOTE — Progress Notes (Signed)
Contacted and scheduled for annual lung screening scan. Patient is a former smoker, quit 01/28/15, with 30 pack year history.

## 2020-05-26 ENCOUNTER — Other Ambulatory Visit: Payer: Self-pay

## 2020-05-26 ENCOUNTER — Ambulatory Visit
Admission: RE | Admit: 2020-05-26 | Discharge: 2020-05-26 | Disposition: A | Discharge status: Home / Self Care | Payer: Medicare Other | Source: Ambulatory Visit | Attending: Oncology | Admitting: Oncology

## 2020-05-26 ENCOUNTER — Other Ambulatory Visit: Payer: Medicare Other

## 2020-05-26 DIAGNOSIS — Z122 Encounter for screening for malignant neoplasm of respiratory organs: Secondary | ICD-10-CM | POA: Insufficient documentation

## 2020-05-26 DIAGNOSIS — R82994 Hypercalciuria: Secondary | ICD-10-CM

## 2020-05-26 DIAGNOSIS — Z87891 Personal history of nicotine dependence: Secondary | ICD-10-CM

## 2020-05-27 LAB — POTASSIUM: Potassium: 4 mmol/L (ref 3.5–5.2)

## 2020-05-27 LAB — GLUCOSE, RANDOM: Glucose: 101 mg/dL — ABNORMAL HIGH (ref 65–99)

## 2020-05-30 ENCOUNTER — Encounter: Payer: Self-pay | Admitting: *Deleted

## 2020-06-02 ENCOUNTER — Ambulatory Visit
Admission: RE | Admit: 2020-06-02 | Discharge: 2020-06-02 | Disposition: A | Discharge status: Home / Self Care | Payer: Medicare Other | Source: Ambulatory Visit | Attending: Urology | Admitting: Urology

## 2020-06-02 ENCOUNTER — Other Ambulatory Visit: Payer: Self-pay

## 2020-06-02 DIAGNOSIS — N201 Calculus of ureter: Secondary | ICD-10-CM | POA: Diagnosis not present

## 2020-06-02 DIAGNOSIS — N200 Calculus of kidney: Secondary | ICD-10-CM

## 2020-06-09 ENCOUNTER — Encounter: Payer: Self-pay | Admitting: Urology

## 2020-06-09 ENCOUNTER — Other Ambulatory Visit: Payer: Self-pay

## 2020-06-09 ENCOUNTER — Ambulatory Visit (INDEPENDENT_AMBULATORY_CARE_PROVIDER_SITE_OTHER): Payer: Medicare Other | Admitting: Urology

## 2020-06-09 VITALS — BP 129/82 | HR 76 | Ht 75.0 in | Wt 212.0 lb

## 2020-06-09 DIAGNOSIS — N201 Calculus of ureter: Secondary | ICD-10-CM

## 2020-06-09 NOTE — Progress Notes (Signed)
06/09/2020 10:38 AM   Stephen Hanna 23-Apr-1956 [id number]  Referring provider: Tonia Ghent, MD [address] [place],   [address]  Chief Complaint  Patient presents with  . Nephrolithiasis    Urologic history:  1.  Nephrolithiasis -Ureteroscopic removal of 10 mm right proximal ureteral calculus, 5 mm right lower pole calculus and bladder calculi times 323/55/73 -Metabolic evaluation remarkable for hypercalciuria at 382 mg /24 hours -Started indapamide December 2021   HPI: 66 y.o. male presents for follow-up.   No problems since last office visit  Serum potassium and glucose within normal limits  Had renal ultrasound performed 06/02/2020 which was interpreted to have an 8 mm right renal calculus  Denies flank, abdominal pain  No bothersome LUTS   PMH: Past Medical History:  Diagnosis Date  . Arthritis    LEFT SHOULDER  . Back pain, chronic    after fall from ladder in 1980s with episodic flares  . BPH (benign prostatic hyperplasia)   . Diverticulitis 10/2010   divertics one small polyp (Dr. Fuller Plan)  . GERD (gastroesophageal reflux disease)    RARE  . History of kidney stones   . Hyperglycemia   . Hyperlipidemia   . Hypertension    H/O  . IBS (irritable bowel syndrome)    diarrhea predominant  . Personal history of kidney stones 02/07/2005   CT ABD/pelvis --Diverticulitis sig left renal stone // CT ABD w/o negative stones + multiple aortic L.N. 07/24/2004//    Surgical History: Past Surgical History:  Procedure Laterality Date  . CHOLECYSTECTOMY    . CYSTOSCOPY W/ RETROGRADES Right 03/01/2020   Procedure: CYSTOSCOPY WITH RETROGRADE PYELOGRAM;  Surgeon: Abbie Sons, MD;  Location: [facility];  Service: Urology;  Laterality: Right;  . CYSTOSCOPY WITH LITHOLAPAXY N/A 03/01/2020   Procedure: CYSTOSCOPY WITH LITHOLAPAXY;  Surgeon: Abbie Sons, MD;  Location: [facility];  Service: Urology;  Laterality: N/A;  .  CYSTOSCOPY/URETEROSCOPY/HOLMIUM LASER/STENT PLACEMENT Right 03/01/2020   Procedure: CYSTOSCOPY/URETEROSCOPY/HOLMIUM LASER/STENT PLACEMENT;  Surgeon: Abbie Sons, MD;  Location: [facility];  Service: Urology;  Laterality: Right;  . LAPAROSCOPIC CHOLECYSTECTOMY W/ CHOLANGIOGRAPHY  12/27/2008   Ct abd/pelvis w/o no gallstones mild dilation entire right ureter lap choley (Dr. Bary Castilla)  . VASECTOMY  1990  . XI ROBOTIC ASSISTED PARAESOPHAGEAL HERNIA REPAIR N/A 09/03/2019   Procedure: XI ROBOTIC ASSISTED Diaphragmatic hernia REPAIR;  Surgeon: Jules Husbands, MD;  Location: [facility];  Service: General;  Laterality: N/A;  . XI ROBOTIC ASSISTED VENTRAL HERNIA N/A 02/04/2020   Procedure: XI ROBOTIC ASSISTED VENTRAL HERNIA;  Surgeon: Jules Husbands, MD;  Location: [facility];  Service: General;  Laterality: N/A;    Home Medications:  Allergies as of 06/09/2020      Reactions   Atorvastatin    Aches at 30mg  but not 20mg  a day.    Chantix [varenicline]    Abnormal dreams   Hydrocodone-acetaminophen Other (See Comments)   edgy, intolerant   Lisinopril Other (See Comments)   cough      Medication List       Accurate as of June 09, 2020 11:59 PM. If you have any questions, ask your nurse or doctor.        atorvastatin 20 MG tablet Commonly known as: LIPITOR Take 20 mg by mouth daily.   indapamide 2.5 MG tablet Commonly known as: LOZOL Take 1 tablet (2.5 mg total) by mouth daily.   naproxen 250 MG tablet Commonly known as: NAPROSYN Take 250  mg by mouth daily.   Nasal Spray Moisturizing 12 HR 0.05 % nasal spray Generic drug: oxymetazoline Place 1 spray into both nostrils 2 (two) times daily as needed for congestion. D& J nasal spray   psyllium 58.6 % packet Commonly known as: METAMUCIL Take 1 packet by mouth daily.   vitamin B-12 1000 MCG tablet Commonly known as: CYANOCOBALAMIN Take 1,000 mcg by mouth daily.       Allergies:  Allergies  Allergen Reactions  . Atorvastatin      Aches at 30mg  but not 20mg  a day.   . Chantix [Varenicline]     Abnormal dreams  . Hydrocodone-Acetaminophen Other (See Comments)    edgy, intolerant  . Lisinopril Other (See Comments)    cough    Family History: Family History  Problem Relation Age of Onset  . Heart disease Father 43       MI  . Kidney disease Father   . Depression Maternal Grandmother   . Alcohol abuse Neg Hx   . Stroke Neg Hx   . Prostate cancer Neg Hx   . Colon cancer Neg Hx     Social History:  reports that he quit smoking about 5 years ago. His smoking use included cigarettes. He has a 30.00 pack-year smoking history. He has never used smokeless tobacco. He reports current alcohol use of about 1.0 standard drink of alcohol per week. He reports that he does not use drugs.   Physical Exam: BP 129/82   Pulse 76   Ht 6\' 3"  (1.905 m)   Wt 212 lb (96.2 kg)   BMI 26.50 kg/m   Constitutional:  Alert and oriented, No acute distress. HEENT: [place] AT, moist mucus membranes.  Trachea midline, no masses. Cardiovascular: No clubbing, cyanosis, or edema. Respiratory: Normal respiratory effort, no increased work of breathing. Skin: No rashes, bruises or suspicious lesions. Neurologic: Grossly intact, no focal deficits, moving all 4 extremities. Psychiatric: Normal mood and affect.    Pertinent Imaging:  Renal ultrasound images were personally reviewed.  No hydronephrosis.  There is no shadowing noted in what was felt to be an 8 mm calculus  Ultrasound renal complete  Narrative CLINICAL DATA:  Nephrolithiasis  EXAM: RENAL / URINARY TRACT ULTRASOUND COMPLETE  COMPARISON:  CT abdomen pelvis 02/11/2020  FINDINGS: Right Kidney:  Renal measurements: 11.2 x 4.7 x 5.1 cm = volume: 141 mL. There is a shadowing calculus measuring 0.8 cm. There is a cyst in the right mid pole measuring 2.3 cm. No hydronephrosis.  Left Kidney:  Renal measurements: 11.5 x 6.0 x 5.2 cm = volume: 188 is mL. Echogenicity  within normal limits. No mass or hydronephrosis visualized.  Bladder:  Appears normal for degree of bladder distention.  Other:  None.  IMPRESSION: 1. Nonobstructing right renal calculus measuring 0.8 cm. Right renal cyst.  2.  Normal appearance of the left kidney.   Electronically Signed By: Audie Pinto M.D. On: 06/02/2020 10:56    Assessment & Plan:    1.  Hypercalciuria  Follow-up 24-hour urine study June 2022   2.  Nephrolithiasis  Not convinced he has an 8 mm calculus as the right side was cleared at the time of ureteroscopy and what is identified as a stone on ultrasound does not have acoustic shadowing.  KUB was ordered  Follow-up 6 months with [place], MD  [facility] [address] [place],  [address] [phone]

## 2020-06-10 ENCOUNTER — Ambulatory Visit
Admission: RE | Admit: 2020-06-10 | Discharge: 2020-06-10 | Disposition: A | Discharge status: Home / Self Care | Payer: Medicare Other | Source: Ambulatory Visit | Attending: Urology | Admitting: Urology

## 2020-06-10 ENCOUNTER — Ambulatory Visit
Admission: RE | Admit: 2020-06-10 | Discharge: 2020-06-10 | Disposition: A | Discharge status: Home / Self Care | Payer: Medicare Other | Attending: Urology | Admitting: Urology

## 2020-06-10 DIAGNOSIS — N201 Calculus of ureter: Secondary | ICD-10-CM | POA: Insufficient documentation

## 2020-06-10 DIAGNOSIS — Z0389 Encounter for observation for other suspected diseases and conditions ruled out: Secondary | ICD-10-CM | POA: Diagnosis not present

## 2020-06-13 ENCOUNTER — Telehealth: Payer: Self-pay | Admitting: *Deleted

## 2020-06-13 NOTE — Telephone Encounter (Signed)
-----   Message from Abbie Sons, MD sent at 06/12/2020 10:37 AM EST ----- Regarding: [facility] Follow-up [facility] 24-hour urine June 2022.  Does not need blood work

## 2020-06-13 NOTE — Telephone Encounter (Signed)
Notified patient as instructed, patient pleased. Discussed follow-up appointments, patient agrees  

## 2020-06-13 NOTE — Telephone Encounter (Signed)
-----   Message from Abbie Sons, MD sent at 06/11/2020 12:59 PM EST ----- KUB reviewed and no evidence of a recurrent renal calculus

## 2020-08-11 ENCOUNTER — Other Ambulatory Visit: Payer: Self-pay | Admitting: Family Medicine

## 2020-08-23 ENCOUNTER — Telehealth: Payer: Self-pay

## 2020-08-23 NOTE — Telephone Encounter (Addendum)
I try to work with patients to be accommodating.  Even if I were in clinic, he would need to be triaged.  I don't advise patients to bypass the triage process.  See what detail you can get and let me know.  Thanks.

## 2020-08-23 NOTE — Telephone Encounter (Signed)
Patient called in a stated that he has been experiencing nasal congestion and cough with green mucus. Patient denied any other symptoms. Patient stated, "Dr. Damita Dunnings stated that he would call in antibiotics anytime I had a sinus infection I could call and he would give me an antibiotic without having to go through all the protocol stuff." Informed patient that Dr. Damita Dunnings was out of the office this week and offered a VV with the other providers. Patient denied. Will send message to Dr. Josefine Class box.

## 2020-08-24 ENCOUNTER — Other Ambulatory Visit: Payer: Self-pay

## 2020-08-24 ENCOUNTER — Ambulatory Visit: Payer: Medicare Other | Admitting: Dermatology

## 2020-08-24 DIAGNOSIS — D229 Melanocytic nevi, unspecified: Secondary | ICD-10-CM

## 2020-08-24 DIAGNOSIS — L814 Other melanin hyperpigmentation: Secondary | ICD-10-CM

## 2020-08-24 DIAGNOSIS — B351 Tinea unguium: Secondary | ICD-10-CM

## 2020-08-24 DIAGNOSIS — L578 Other skin changes due to chronic exposure to nonionizing radiation: Secondary | ICD-10-CM | POA: Diagnosis not present

## 2020-08-24 DIAGNOSIS — L821 Other seborrheic keratosis: Secondary | ICD-10-CM

## 2020-08-24 DIAGNOSIS — Z1283 Encounter for screening for malignant neoplasm of skin: Secondary | ICD-10-CM

## 2020-08-24 DIAGNOSIS — D1800 Hemangioma unspecified site: Secondary | ICD-10-CM

## 2020-08-24 DIAGNOSIS — D692 Other nonthrombocytopenic purpura: Secondary | ICD-10-CM | POA: Diagnosis not present

## 2020-08-24 MED ORDER — KETOCONAZOLE 2 % EX CREA: TOPICAL_CREAM | CUTANEOUS | 6 refills | Status: DC

## 2020-08-24 MED ORDER — JUBLIA 10 % EX SOLN: CUTANEOUS | 11 refills | Status: DC

## 2020-08-24 MED ORDER — AMOXICILLIN-POT CLAVULANATE 875-125 MG PO TABS: 1.0000 | ORAL_TABLET | Freq: Two times a day (BID) | ORAL | 0 refills | Status: DC

## 2020-08-24 NOTE — Patient Instructions (Signed)

## 2020-08-24 NOTE — Telephone Encounter (Signed)
Can one of you triage this patient?

## 2020-08-24 NOTE — Telephone Encounter (Signed)
I triaged this patient yesterday. The only symptoms patient had was nasal congestion and cough. Patient had no additional symptoms and refused to be seen anywhere. Patient just wanted antibiotics. Please advise.

## 2020-08-24 NOTE — Addendum Note (Signed)
Addended by: Tonia Ghent on: 08/24/2020 11:04 PM   Modules accepted: Orders

## 2020-08-24 NOTE — Progress Notes (Signed)
   New Patient Visit  Subjective  Stephen Hanna is a 65 y.o. male who presents for the following: Annual Exam (No personal hx of skin cancers or dysplastic nevi ). The patient presents for Total-Body Skin Exam (TBSE) for skin cancer screening and mole check.  The following portions of the chart were reviewed this encounter and updated as appropriate:   Tobacco  Allergies  Meds  Problems  Med Hx  Surg Hx  Fam Hx     Review of Systems:  No other skin or systemic complaints except as noted in HPI or Assessment and Plan.  Objective  Well appearing patient in no apparent distress; mood and affect are within normal limits.  A full examination was performed including scalp, head, eyes, ears, nose, lips, neck, chest, axillae, abdomen, back, buttocks, bilateral upper extremities, bilateral lower extremities, hands, feet, fingers, toes, fingernails, and toenails. All findings within normal limits unless otherwise noted below.  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 Start Kerydin or [place] QHS (which ever is cheapest) and Ketoconazole 2% cream to the toenails and feet QHS.  ketoconazole (NIZORAL) 2 % cream - B/L toenails Other Related Medications Efinaconazole (JUBLIA) 10 % SOLN   Lentigines - Scattered tan macules - Due to sun exposure - Benign-appering, observe - Recommend daily broad spectrum sunscreen SPF 30+ to sun-exposed areas, reapply every 2 hours as needed. - Call for any changes  Seborrheic Keratoses - Stuck-on, waxy, tan-brown papules and/or plaques  - Benign-appearing - Discussed benign etiology and prognosis. - Observe - Call for any changes  Melanocytic Nevi - Tan-brown and/or pink-flesh-colored symmetric macules and papules - Benign appearing on exam today - Observation - Call clinic for new or changing moles - Recommend daily use of broad spectrum spf 30+ sunscreen to  sun-exposed areas.   Hemangiomas - Red papules - Discussed benign nature - Observe - Call for any changes  Actinic Damage - Chronic condition, secondary to cumulative UV/sun exposure - diffuse scaly erythematous macules with underlying dyspigmentation - Recommend daily broad spectrum sunscreen SPF 30+ to sun-exposed areas, reapply every 2 hours as needed.  - Staying in the shade or wearing long sleeves, sun glasses (UVA+UVB protection) and wide brim hats (4-inch brim around the entire circumference of the hat) are also recommended for sun protection.  - Call for new or changing lesions.  Purpura - Chronic; persistent and recurrent.  Treatable, but not curable. - Violaceous macules and patches - Benign - Related to trauma, age, sun damage and/or use of blood thinners, chronic use of topical and/or oral steroids - Observe - Can use OTC arnica containing moisturizer such as Dermend Bruise Formula if desired - Call for worsening or other concerns  Skin cancer screening performed today.  Return if symptoms worsen or fail to improve.  Luther Redo, CMA, am acting as scribe for Sarina Ser, MD .  Documentation: I have reviewed the above documentation for accuracy and completeness, and I agree with the above.  Sarina Ser, MD

## 2020-08-24 NOTE — Telephone Encounter (Signed)
If he doesn't have facial pain, then I wouldn't start abx yet.  I advise him to get a covid test and use flonase and claritin (if not already started).    If covid neg and if flonase and claritin haven't helped/don't help, then start the abx.  He'll need to get checked if worse in the meantime.  rx sent.  Thanks.

## 2020-08-25 ENCOUNTER — Other Ambulatory Visit: Payer: Self-pay

## 2020-08-25 DIAGNOSIS — B351 Tinea unguium: Secondary | ICD-10-CM

## 2020-08-25 MED ORDER — JUBLIA 10 % EX SOLN: CUTANEOUS | 11 refills | Status: DC

## 2020-08-25 NOTE — Telephone Encounter (Signed)
Spoke with patient and discussed below message with him. Patient got upset that no abx would be given. Patient states Dr. Damita Dunnings told him if he got a sinus infection again that he would be given abx when he was here for his CPE. Patient refuses to get tested; just wants abx. Tried to advise patient to get checked out now or if he gets worse but patient states he will just suffer thru like he had to before and hung up the phone.

## 2020-08-28 ENCOUNTER — Encounter: Payer: Self-pay | Admitting: Dermatology

## 2020-09-28 ENCOUNTER — Telehealth: Payer: Self-pay | Admitting: Family Medicine

## 2020-09-28 NOTE — Telephone Encounter (Signed)
LVM for pt to rtn my call to schedule AWV with nha 

## 2020-12-07 ENCOUNTER — Other Ambulatory Visit: Payer: Self-pay | Admitting: *Deleted

## 2020-12-07 DIAGNOSIS — N201 Calculus of ureter: Secondary | ICD-10-CM

## 2020-12-08 ENCOUNTER — Other Ambulatory Visit: Payer: Self-pay

## 2020-12-08 ENCOUNTER — Ambulatory Visit
Admission: RE | Admit: 2020-12-08 | Discharge: 2020-12-08 | Disposition: A | Discharge status: Home / Self Care | Payer: Medicare Other | Source: Ambulatory Visit | Attending: Urology | Admitting: Urology

## 2020-12-08 ENCOUNTER — Encounter: Payer: Self-pay | Admitting: Urology

## 2020-12-08 ENCOUNTER — Ambulatory Visit: Payer: Medicare Other | Admitting: Urology

## 2020-12-08 VITALS — BP 118/82 | HR 79 | Ht 74.0 in | Wt 214.0 lb

## 2020-12-08 DIAGNOSIS — N200 Calculus of kidney: Secondary | ICD-10-CM | POA: Diagnosis not present

## 2020-12-08 DIAGNOSIS — R82994 Hypercalciuria: Secondary | ICD-10-CM | POA: Diagnosis not present

## 2020-12-08 DIAGNOSIS — Z9049 Acquired absence of other specified parts of digestive tract: Secondary | ICD-10-CM | POA: Diagnosis not present

## 2020-12-08 DIAGNOSIS — Z87442 Personal history of urinary calculi: Secondary | ICD-10-CM | POA: Diagnosis not present

## 2020-12-08 DIAGNOSIS — N201 Calculus of ureter: Secondary | ICD-10-CM

## 2020-12-08 DIAGNOSIS — I878 Other specified disorders of veins: Secondary | ICD-10-CM | POA: Diagnosis not present

## 2020-12-08 NOTE — Progress Notes (Signed)
12/08/2020 7:28 PM   Stephen Hanna March 22, 1956 [id number]  Referring provider: Tonia Ghent, MD [place],  [place] [address]  Chief Complaint  Patient presents with   Ureteral calculus    1.  Nephrolithiasis -Ureteroscopic removal of 10 mm right proximal ureteral calculus, 5 mm right lower pole calculus and bladder calculi times 123XX123 -Metabolic evaluation remarkable for hypercalciuria at 382 mg /24 hours -Started indapamide December 2021   HPI: 65 y.o. male presents for semiannual follow-up.  No problems since last visit 06/2020 Denies flank, pelvic pain No bothersome LUTS, dysuria or gross hematuria RUS 05/2020 was felt to show an 8 mm right renal calculus and at the completion of his procedure no calculi were identified.  A KUB was obtained which did not show a definite calculus  PMH: Past Medical History:  Diagnosis Date   Arthritis    LEFT SHOULDER   Back pain, chronic    after fall from ladder in 1980s with episodic flares   BPH (benign prostatic hyperplasia)    Diverticulitis 10/2010   divertics one small polyp (Dr. Fuller Plan)   GERD (gastroesophageal reflux disease)    RARE   History of kidney stones    Hyperglycemia    Hyperlipidemia    Hypertension    H/O   IBS (irritable bowel syndrome)    diarrhea predominant   Personal history of kidney stones 02/07/2005   CT ABD/pelvis --Diverticulitis sig left renal stone // CT ABD w/o negative stones + multiple aortic L.N. 07/24/2004//    Surgical History: Past Surgical History:  Procedure Laterality Date   CHOLECYSTECTOMY     CYSTOSCOPY W/ RETROGRADES Right 03/01/2020   Procedure: CYSTOSCOPY WITH RETROGRADE PYELOGRAM;  Surgeon: Abbie Sons, MD;  Location: [facility];  Service: Urology;  Laterality: Right;   CYSTOSCOPY WITH LITHOLAPAXY N/A 03/01/2020   Procedure: CYSTOSCOPY WITH LITHOLAPAXY;  Surgeon: Abbie Sons, MD;  Location: [facility];  Service: Urology;  Laterality: N/A;    CYSTOSCOPY/URETEROSCOPY/HOLMIUM LASER/STENT PLACEMENT Right 03/01/2020   Procedure: CYSTOSCOPY/URETEROSCOPY/HOLMIUM LASER/STENT PLACEMENT;  Surgeon: Abbie Sons, MD;  Location: [facility];  Service: Urology;  Laterality: Right;   LAPAROSCOPIC CHOLECYSTECTOMY W/ CHOLANGIOGRAPHY  12/27/2008   Ct abd/pelvis w/o no gallstones mild dilation entire right ureter lap choley (Dr. Bary Castilla)   [place] PARAESOPHAGEAL HERNIA REPAIR N/A 09/03/2019   Procedure: XI ROBOTIC ASSISTED Diaphragmatic hernia REPAIR;  Surgeon: Jules Husbands, MD;  Location: [facility];  Service: General;  Laterality: N/A;   XI ROBOTIC ASSISTED VENTRAL HERNIA N/A 02/04/2020   Procedure: XI ROBOTIC ASSISTED VENTRAL HERNIA;  Surgeon: Jules Husbands, MD;  Location: [facility];  Service: General;  Laterality: N/A;    Home Medications:  Allergies as of 12/08/2020       Reactions   Atorvastatin    Aches at '30mg'$  but not '20mg'$  a day.    Chantix [varenicline]    Abnormal dreams   Hydrocodone-acetaminophen Other (See Comments)   edgy, intolerant   Lisinopril Other (See Comments)   cough        Medication List        Accurate as of December 08, 2020  7:28 PM. If you have any questions, ask your nurse or doctor.          STOP taking these medications    amoxicillin-clavulanate 875-125 MG tablet Commonly known as: Augmentin Stopped by: Abbie Sons, MD       TAKE these  medications    atorvastatin 20 MG tablet Commonly known as: LIPITOR TAKE 1 AND 1/2 TABLETS BY MOUTH DAILY   indapamide 2.5 MG tablet Commonly known as: LOZOL Take 1 tablet (2.5 mg total) by mouth daily.   Jublia 10 % Soln Generic drug: Efinaconazole Apply to toenails QHS.   ketoconazole 2 % cream Commonly known as: NIZORAL Apply to the feet and toenails QHS.   naproxen 250 MG tablet Commonly known as: NAPROSYN Take 250 mg by mouth daily.   Nasal Spray Moisturizing 12 HR 0.05 % nasal spray Generic drug:  oxymetazoline Place 1 spray into both nostrils 2 (two) times daily as needed for congestion. D& J nasal spray   psyllium 58.6 % packet Commonly known as: METAMUCIL Take 1 packet by mouth daily.   vitamin B-12 1000 MCG tablet Commonly known as: CYANOCOBALAMIN Take 1,000 mcg by mouth daily.        Allergies:  Allergies  Allergen Reactions   Atorvastatin     Aches at '30mg'$  but not '20mg'$  a day.    Chantix [Varenicline]     Abnormal dreams   Hydrocodone-Acetaminophen Other (See Comments)    edgy, intolerant   Lisinopril Other (See Comments)    cough    Family History: Family History  Problem Relation Age of Onset   Heart disease Father 35       MI   Kidney disease Father    Depression Maternal Grandmother    Alcohol abuse Neg Hx    Stroke Neg Hx    Prostate cancer Neg Hx    Colon cancer Neg Hx     Social History:  reports that he quit smoking about 5 years ago. His smoking use included cigarettes. He has a 30.00 pack-year smoking history. He has never used smokeless tobacco. He reports current alcohol use of about 1.0 standard drink of alcohol per week. He reports that he does not use drugs.   Physical Exam: BP 118/82   Pulse 79   Ht '6\' 2"'$  (1.88 m)   Wt 214 lb (97.1 kg)   BMI 27.48 kg/m   Constitutional:  Alert and oriented, No acute distress. HEENT: [place] AT, moist mucus membranes.  Trachea midline, no masses. Cardiovascular: No clubbing, cyanosis, or edema. Respiratory: Normal respiratory effort, no increased work of breathing.   Pertinent Imaging: KUB performed today was personally reviewed and interpreted.  There is a moderate amount of stool and bowel gas overlying the renal outlines.  A definite calculus is not identified   Assessment & Plan:    1.  Personal history urinary calculi No calculi identified overlying renal outlines Continue indapamide Repeat 24-hour urine study next follow-up Call earlier for development of flank pain  2.   Hypercalciuria As above   Return for 6 month KUB only 1 yr f.u with KUB.   Abbie Sons, [place] [address] [place], [place] [address] [phone]

## 2021-02-13 ENCOUNTER — Telehealth: Payer: Self-pay | Admitting: Family Medicine

## 2021-02-13 NOTE — Telephone Encounter (Signed)
LVM for pt to rtn my call to r/s appt with NHA. On 02/24/21

## 2021-02-24 ENCOUNTER — Ambulatory Visit: Payer: Medicare Other

## 2021-03-28 ENCOUNTER — Other Ambulatory Visit: Payer: Self-pay | Admitting: Urology

## 2021-04-02 NOTE — Progress Notes (Signed)
Subjective:   Stephen Hanna is a 65 y.o. male who presents for Medicare Annual/Subsequent preventive examination.  I connected with Stephen Hanna today by telephone and verified that I am speaking with the correct person using two identifiers. Location patient: home Location provider: work Persons participating in the virtual visit: patient, Marine scientist.    I discussed the limitations, risks, security and privacy concerns of performing an evaluation and management service by telephone and the availability of in person appointments. I also discussed with the patient that there may be a patient responsible charge related to this service. The patient expressed understanding and verbally consented to this telephonic visit.    Interactive audio and video telecommunications were attempted between this provider and patient, however failed, due to patient having technical difficulties OR patient did not have access to video capability.  We continued and completed visit with audio only.  Some vital signs may be absent or patient reported.   Time Spent with patient on telephone encounter: 20 minutes  Review of Systems     Cardiac Risk Factors include: advanced age (>45men, >69 women);dyslipidemia     Objective:    Today's Vitals   04/03/21 1313  Weight: 210 lb (95.3 kg)  Height: 6\' 2"  (1.88 m)   Body mass index is 26.96 kg/m.  Advanced Directives 04/03/2021 01/28/2020 09/03/2019 08/28/2019 08/25/2019 08/08/2019 04/24/2019  Does Patient Have a Medical Advance Directive? Yes No Yes Yes Yes No Yes  Type of Paramedic of [place];Living will - Living will;Healthcare Power of [place];Living will - [place];Living will  Does patient want to make changes to medical advance directive? Yes (MAU/Ambulatory/Procedural Areas - Information given) - No - Patient declined - - - -  Copy of [place] in Chart? - - No -  copy requested - No - copy requested - -  Would patient like information on creating a medical advance directive? - No - Patient declined - - - No - Patient declined -    Current Medications (verified) Outpatient Encounter Medications as of 04/03/2021  Medication Sig   atorvastatin (LIPITOR) 20 MG tablet TAKE 1 AND 1/2 TABLETS BY MOUTH DAILY   Efinaconazole (JUBLIA) 10 % SOLN Apply to toenails QHS.   indapamide (LOZOL) 2.5 MG tablet TAKE 1 TABLET BY MOUTH EVERY DAY   ketoconazole (NIZORAL) 2 % cream Apply to the feet and toenails QHS.   naproxen (NAPROSYN) 250 MG tablet Take 250 mg by mouth daily.   oxymetazoline (NASAL SPRAY MOISTURIZING 12 HR) 0.05 % nasal spray Place 1 spray into both nostrils 2 (two) times daily as needed for congestion. D& J nasal spray   psyllium (METAMUCIL) 58.6 % packet Take 1 packet by mouth daily.   vitamin B-12 (CYANOCOBALAMIN) 1000 MCG tablet Take 1,000 mcg by mouth daily.   No facility-administered encounter medications on file as of 04/03/2021.    Allergies (verified) Atorvastatin, Chantix [varenicline], Hydrocodone-acetaminophen, and Lisinopril   History: Past Medical History:  Diagnosis Date   Arthritis    LEFT SHOULDER   Back pain, chronic    after fall from ladder in 1980s with episodic flares   BPH (benign prostatic hyperplasia)    Diverticulitis 10/2010   divertics one small polyp (Dr. Fuller Plan)   GERD (gastroesophageal reflux disease)    RARE   History of kidney stones    Hyperglycemia    Hyperlipidemia    Hypertension    H/O   IBS (  irritable bowel syndrome)    diarrhea predominant   Personal history of kidney stones 02/07/2005   CT ABD/pelvis --Diverticulitis sig left renal stone // CT ABD w/o negative stones + multiple aortic L.N. 07/24/2004//   Past Surgical History:  Procedure Laterality Date   CHOLECYSTECTOMY     CYSTOSCOPY W/ RETROGRADES Right 03/01/2020   Procedure: CYSTOSCOPY WITH RETROGRADE PYELOGRAM;  Surgeon: Abbie Sons, MD;  Location: [facility];  Service: Urology;  Laterality: Right;   CYSTOSCOPY WITH LITHOLAPAXY N/A 03/01/2020   Procedure: CYSTOSCOPY WITH LITHOLAPAXY;  Surgeon: Abbie Sons, MD;  Location: [facility];  Service: Urology;  Laterality: N/A;   CYSTOSCOPY/URETEROSCOPY/HOLMIUM LASER/STENT PLACEMENT Right 03/01/2020   Procedure: CYSTOSCOPY/URETEROSCOPY/HOLMIUM LASER/STENT PLACEMENT;  Surgeon: Abbie Sons, MD;  Location: [facility];  Service: Urology;  Laterality: Right;   LAPAROSCOPIC CHOLECYSTECTOMY W/ CHOLANGIOGRAPHY  12/27/2008   Ct abd/pelvis w/o no gallstones mild dilation entire right ureter lap choley (Dr. Bary Castilla)   [place] PARAESOPHAGEAL HERNIA REPAIR N/A 09/03/2019   Procedure: XI ROBOTIC ASSISTED Diaphragmatic hernia REPAIR;  Surgeon: Jules Husbands, MD;  Location: [facility];  Service: General;  Laterality: N/A;   XI ROBOTIC ASSISTED VENTRAL HERNIA N/A 02/04/2020   Procedure: XI ROBOTIC ASSISTED VENTRAL HERNIA;  Surgeon: Jules Husbands, MD;  Location: [facility];  Service: General;  Laterality: N/A;   Family History  Problem Relation Age of Onset   Heart disease Father 27       MI   Kidney disease Father    Depression Maternal Grandmother    Alcohol abuse Neg Hx    Stroke Neg Hx    Prostate cancer Neg Hx    Colon cancer Neg Hx    Social History   Socioeconomic History   Marital status: Married    Spouse name: Not on file   Number of children: Not on file   Years of education: Not on file   Highest education level: Not on file  Occupational History   Not on file  Tobacco Use   Smoking status: Former    Packs/day: 1.00    Years: 30.00    Pack years: 30.00    Types: Cigarettes    Quit date: 01/28/2015    Years since quitting: 6.1   Smokeless tobacco: Never   Tobacco comments:    quit 6 years ago  Vaping Use   Vaping Use: Never used  Substance and Sexual Activity   Alcohol use: Yes    Alcohol/week: 1.0 standard drink    Types: 1  Standard drinks or equivalent per week    Comment: 1 drink a month   Drug use: No   Sexual activity: Yes  Other Topics Concern   Not on file  Social History Narrative   Retired from [employer], Dealer   Former Brewing technologist, 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      Left handed   Lives in a single story home with wife   Social Determinants of Radio broadcast assistant Strain: Low Risk    Difficulty of Paying Living Expenses: Not hard at all  Food Insecurity: No Food Insecurity   Worried About Charity fundraiser in the Last Year: Never true   Arboriculturist in the Last Year: Never true  Transportation Needs: No Transportation Needs   Lack of Transportation (Medical): No   Lack of Transportation (Non-Medical): No  Physical Activity: Sufficiently Active   Days of Exercise per Week:  3 days   Minutes of Exercise per Session: 60 min  Stress: No Stress Concern Present   Feeling of Stress : Not at all  Social Connections: Socially Integrated   Frequency of Communication with Friends and Family: More than three times a week   Frequency of Social Gatherings with Friends and Family: More than three times a week   Attends Religious Services: More than 4 times per year   Active Member of [employer] or Organizations: Yes   Attends Music therapist: More than 4 times per year   Marital Status: Married    Tobacco Counseling Counseling given: Not Answered Tobacco comments: quit 6 years ago   Clinical Intake:  Pre-visit preparation completed: Yes  Pain : No/denies pain     BMI - recorded: 27.46 Nutritional Status: BMI 25 -29 Overweight Nutritional Risks: None Diabetes: No  How often do you need to have someone help you when you read instructions, pamphlets, or other written materials from your doctor or pharmacy?: 1 - Never  Diabetic?No  Interpreter Needed?: No  Information entered by :: Orrin Brigham LPN   Activities of Daily Living In your present state of health,  do you have any difficulty performing the following activities: 04/03/2021  Hearing? N  Vision? N  Difficulty concentrating or making decisions? N  Walking or climbing stairs? N  Dressing or bathing? N  Doing errands, shopping? N  Preparing Food and eating ? N  Using the Toilet? N  In the past six months, have you accidently leaked urine? N  Do you have problems with loss of bowel control? N  Managing your Medications? N  Managing your Finances? N  Housekeeping or managing your Housekeeping? N  Some recent data might be hidden    Patient Care Team: Tonia Ghent, MD as PCP - General (Family Medicine) Alda Berthold, DO as Consulting Physician (Neurology) Tonia Ghent, MD (Family Medicine)  Indicate any recent Medical Services you may have received from other than [facility] providers in the past year (date may be approximate).     Assessment:   This is a routine wellness examination for Bernard.  Hearing/Vision screen Hearing Screening - Comments:: No issues Vision Screening - Comments:: Last exam 2021, plans to make an appointment, Dr. Ellin Mayhew, wears readers  Dietary issues and exercise activities discussed: Current Exercise Habits: Home exercise routine, Type of exercise: walking, Time (Minutes): 60, Frequency (Times/Week): 3, Weekly Exercise (Minutes/Week): 180, Intensity: Moderate   Goals Addressed             This Visit's Progress    Patient Stated       Would like maintain current health.       Depression Screen PHQ 2/9 Scores 04/03/2021 08/25/2019 03/02/2019 02/17/2018 02/11/2017  PHQ - 2 Score 0 0 0 0 0  PHQ- 9 Score - 0 - - -    Fall Risk Fall Risk  04/03/2021 04/05/2020 02/11/2020 01/27/2020 09/16/2019  Falls in the past year? 0 0 0 0 0  Number falls in past yr: 0 0 - - 0  Injury with Fall? 0 0 - - 0  Risk for fall due to : No Fall Risks - - - -  Follow up Falls prevention discussed - - - -    FALL RISK PREVENTION PERTAINING TO THE HOME:  Any stairs  in or around the home? Yes  If so, are there any without handrails? No  Home free of loose throw rugs in walkways, pet  beds, electrical cords, etc? Yes  Adequate lighting in your home to reduce risk of falls? Yes   ASSISTIVE DEVICES UTILIZED TO PREVENT FALLS:  Life alert? No  Use of a cane, walker or w/c? No  Grab bars in the bathroom? Yes  Shower chair or bench in shower? No  Elevated toilet seat or a handicapped toilet? No   TIMED UP AND GO:  Was the test performed? No , visit completed over the phone.  Cognitive Function: Normal cognitive status assessed by this Nurse Health Advisor. No abnormalities found.   MMSE - Mini Mental State Exam 08/25/2019  Orientation to time 5  Orientation to Place 5  Registration 3  Attention/ Calculation 0  Recall 3  Language- repeat 1        Immunizations Immunization History  Administered Date(s) Administered   Fluad Quad(high Dose 65+) 03/02/2019, 04/05/2020   Influenza Split 04/11/2011   Influenza Whole 04/17/2007, 02/10/2008   Influenza,inj,Quad PF,6+ Mos 01/26/2013, 01/28/2014, 02/01/2015, 02/11/2017, 02/17/2018   Moderna Sars-Covid-2 Vaccination 11/06/2019, 12/12/2019   Pneumococcal Polysaccharide-23 03/02/2019   Td 05/07/2004   Tdap 02/01/2015   Zoster, Live 01/28/2014    TDAP status: Up to date  Flu Vaccine status: Due, Education has been provided regarding the importance of this vaccine. Advised may receive this vaccine at local pharmacy or Health Dept. Aware to provide a copy of the vaccination record if obtained from local pharmacy or Health Dept. Verbalized acceptance and understanding.  Pneumococcal vaccine status: Up to date , Pneumovax 23 completed.  Covid-19 vaccine status: Declined, Education has been provided regarding the importance of this vaccine but patient still declined. Advised may receive this vaccine at local pharmacy or Health Dept.or vaccine clinic. Aware to provide a copy of the vaccination record if  obtained from local pharmacy or Health Dept. Verbalized acceptance and understanding.  Qualifies for Shingles Vaccine? Yes   Zostavax completed Yes   Shingrix Completed?: No.    Education has been provided regarding the importance of this vaccine. Patient has been advised to call insurance company to determine out of pocket expense if they have not yet received this vaccine. Advised may also receive vaccine at local pharmacy or Health Dept. Verbalized acceptance and understanding.  Screening Tests Health Maintenance  Topic Date Due   Zoster Vaccines- Shingrix (1 of 2) Never done   COVID-19 Vaccine (3 - Moderna risk series) 01/09/2020   Pneumonia Vaccine 29+ Years old (2 - PCV) 03/01/2020   INFLUENZA VACCINE  12/05/2020   TETANUS/TDAP  01/31/2025   COLONOSCOPY (Pts 45-93yrs Insurance coverage will need to be confirmed)  03/28/2025   Hepatitis C Screening  Completed   HPV VACCINES  Aged Out    Health Maintenance  Health Maintenance Due  Topic Date Due   Zoster Vaccines- Shingrix (1 of 2) Never done   COVID-19 Vaccine (3 - Moderna risk series) 01/09/2020   Pneumonia Vaccine 22+ Years old (2 - PCV) 03/01/2020   INFLUENZA VACCINE  12/05/2020    Colorectal cancer screening: Type of screening: Colonoscopy. Completed 03/29/15. Repeat every 10 years  Lung Cancer Screening: (Low Dose CT Chest recommended if Age 51-80 years, 30 pack-year currently smoking OR have quit w/in 15years.) does qualify.   Lung Cancer Screening Referral: Lung scan completed 05/26/20  Additional Screening:  Hepatitis C Screening: does qualify; Completed 02/01/15  Vision Screening: Recommended annual ophthalmology exams for early detection of glaucoma and other disorders of the eye. Is the patient up to date with their annual eye  exam?  No , patient plans on scheduling an appointment Who is the provider or what is the name of the office in which the patient attends annual eye exams? Dr. Ellin Mayhew   Dental  Screening: Recommended annual dental exams for proper oral hygiene  Community Resource Referral / Chronic Care Management: CRR required this visit?  No   CCM required this visit?  No      Plan:     I have personally reviewed and noted the following in the patient's chart:   Medical and social history Use of alcohol, tobacco or illicit drugs  Current medications and supplements including opioid prescriptions. Patient is not currently taking opioid prescriptions. Functional ability and status Nutritional status Physical activity Advanced directives List of other physicians Hospitalizations, surgeries, and ER visits in previous 12 months Vitals Screenings to include cognitive, depression, and falls Referrals and appointments  In addition, I have reviewed and discussed with patient certain preventive protocols, quality metrics, and best practice recommendations. A written personalized care plan for preventive services as well as general preventive health recommendations were provided to patient.   Due to this being a telephonic visit, the after visit summary with patients personalized plan was offered to patient via mail or my-chart. Patient preferred to pick up at office at next visit.   Loma Messing, LPN   [id number]   Nurse Health Advisor  Nurse Notes: none

## 2021-04-03 ENCOUNTER — Ambulatory Visit (INDEPENDENT_AMBULATORY_CARE_PROVIDER_SITE_OTHER): Payer: Medicare Other

## 2021-04-03 VITALS — Ht 74.0 in | Wt 210.0 lb

## 2021-04-03 DIAGNOSIS — Z0000 Encounter for general adult medical examination without abnormal findings: Secondary | ICD-10-CM

## 2021-04-03 NOTE — Patient Instructions (Signed)
Stephen Hanna , Thank you for taking time to complete your Medicare Wellness Visit. I appreciate your ongoing commitment to your health goals. Please review the following plan we discussed and let me know if I can assist you in the future.   Screening recommendations/referrals: Colonoscopy: up to date, completed 04/02/15, due 04/01/25 Recommended yearly ophthalmology/optometry visit for glaucoma screening and checkup Recommended yearly dental visit for hygiene and checkup  Vaccinations: Influenza vaccine: Due-May obtain vaccine at our office or your local pharmacy. Pneumococcal vaccine: up to date Tdap vaccine: up to date, completed 02/01/15, due 01/31/25 Shingles vaccine: Discuss with your local pharmacy if you change your mind  Covid-19:  newest booster available at your local pharmacy  Advanced directives: Please bring a copy of Living Will and/or [place] for your chart.   Conditions/risks identified: see problem list  Next appointment: Follow up in one year for your annual wellness visit. 04/05/22 @ 8:15am, this will be a telephone visit  Preventive Care 67 Years and Older, Male Preventive care refers to lifestyle choices and visits with your health care provider that can promote health and wellness. What does preventive care include? A yearly physical exam. This is also called an annual well check. Dental exams once or twice a year. Routine eye exams. Ask your health care provider how often you should have your eyes checked. Personal lifestyle choices, including: Daily care of your teeth and gums. Regular physical activity. Eating a healthy diet. Avoiding tobacco and drug use. Limiting alcohol use. Practicing safe sex. Taking low doses of aspirin every day. Taking vitamin and mineral supplements as recommended by your health care provider. What happens during an annual well check? The services and screenings done by your health care provider during your  annual well check will depend on your age, overall health, lifestyle risk factors, and family history of disease. Counseling  Your health care provider may ask you questions about your: Alcohol use. Tobacco use. Drug use. Emotional well-being. Home and relationship well-being. Sexual activity. Eating habits. History of falls. Memory and ability to understand (cognition). Work and work Statistician. Screening  You may have the following tests or measurements: Height, weight, and BMI. Blood pressure. Lipid and cholesterol levels. These may be checked every 5 years, or more frequently if you are over 67 years old. Skin check. Lung cancer screening. You may have this screening every year starting at age 67 if you have a 30-pack-year history of smoking and currently smoke or have quit within the past 15 years. Fecal occult blood test (FOBT) of the stool. You may have this test every year starting at age 67. Flexible sigmoidoscopy or colonoscopy. You may have a sigmoidoscopy every 5 years or a colonoscopy every 10 years starting at age 67. Prostate cancer screening. Recommendations will vary depending on your family history and other risks. Hepatitis C blood test. Hepatitis B blood test. Sexually transmitted disease (STD) testing. Diabetes screening. This is done by checking your blood sugar (glucose) after you have not eaten for a while (fasting). You may have this done every 1-3 years. Abdominal aortic aneurysm (AAA) screening. You may need this if you are a current or former smoker. Osteoporosis. You may be screened starting at age 31 if you are at high risk. Talk with your health care provider about your test results, treatment options, and if necessary, the need for more tests. Vaccines  Your health care provider may recommend certain vaccines, such as: Influenza vaccine. This is recommended every  year. Tetanus, diphtheria, and acellular pertussis (Tdap, Td) vaccine. You may need a Td  booster every 10 years. Zoster vaccine. You may need this after age 67. Pneumococcal 13-valent conjugate (PCV13) vaccine. One dose is recommended after age 67. Pneumococcal polysaccharide (PPSV23) vaccine. One dose is recommended after age 67. Talk to your health care provider about which screenings and vaccines you need and how often you need them. This information is not intended to replace advice given to you by your health care provider. Make sure you discuss any questions you have with your health care provider. Document Released: 05/20/2015 Document Revised: 01/11/2016 Document Reviewed: 02/22/2015 Elsevier Interactive Patient Education  2017 [place] Prevention in the Home Falls can cause injuries. They can happen to people of all ages. There are many things you can do to make your home safe and to help prevent falls. What can I do on the outside of my home? Regularly fix the edges of walkways and driveways and fix any cracks. Remove anything that might make you trip as you walk through a door, such as a raised step or threshold. Trim any bushes or trees on the path to your home. Use bright outdoor lighting. Clear any walking paths of anything that might make someone trip, such as rocks or tools. Regularly check to see if handrails are loose or broken. Make sure that both sides of any steps have handrails. Any raised decks and porches should have guardrails on the edges. Have any leaves, snow, or ice cleared regularly. Use sand or salt on walking paths during winter. Clean up any spills in your garage right away. This includes oil or grease spills. What can I do in the bathroom? Use night lights. Install grab bars by the toilet and in the tub and shower. Do not use towel bars as grab bars. Use non-skid mats or decals in the tub or shower. If you need to sit down in the shower, use a plastic, non-slip stool. Keep the floor dry. Clean up any water that spills on the floor  as soon as it happens. Remove soap buildup in the tub or shower regularly. Attach bath mats securely with double-sided non-slip rug tape. Do not have throw rugs and other things on the floor that can make you trip. What can I do in the bedroom? Use night lights. Make sure that you have a light by your bed that is easy to reach. Do not use any sheets or blankets that are too big for your bed. They should not hang down onto the floor. Have a firm chair that has side arms. You can use this for support while you get dressed. Do not have throw rugs and other things on the floor that can make you trip. What can I do in the kitchen? Clean up any spills right away. Avoid walking on wet floors. Keep items that you use a lot in easy-to-reach places. If you need to reach something above you, use a strong step stool that has a grab bar. Keep electrical cords out of the way. Do not use floor polish or wax that makes floors slippery. If you must use wax, use non-skid floor wax. Do not have throw rugs and other things on the floor that can make you trip. What can I do with my stairs? Do not leave any items on the stairs. Make sure that there are handrails on both sides of the stairs and use them. Fix handrails that are broken or  loose. Make sure that handrails are as long as the stairways. Check any carpeting to make sure that it is firmly attached to the stairs. Fix any carpet that is loose or worn. Avoid having throw rugs at the top or bottom of the stairs. If you do have throw rugs, attach them to the floor with carpet tape. Make sure that you have a light switch at the top of the stairs and the bottom of the stairs. If you do not have them, ask someone to add them for you. What else can I do to help prevent falls? Wear shoes that: Do not have high heels. Have rubber bottoms. Are comfortable and fit you well. Are closed at the toe. Do not wear sandals. If you use a stepladder: Make sure that it is  fully opened. Do not climb a closed stepladder. Make sure that both sides of the stepladder are locked into place. Ask someone to hold it for you, if possible. Clearly mark and make sure that you can see: Any grab bars or handrails. First and last steps. Where the edge of each step is. Use tools that help you move around (mobility aids) if they are needed. These include: Canes. Walkers. Scooters. Crutches. Turn on the lights when you go into a dark area. Replace any light bulbs as soon as they burn out. Set up your furniture so you have a clear path. Avoid moving your furniture around. If any of your floors are uneven, fix them. If there are any pets around you, be aware of where they are. Review your medicines with your doctor. Some medicines can make you feel dizzy. This can increase your chance of falling. Ask your doctor what other things that you can do to help prevent falls. This information is not intended to replace advice given to you by your health care provider. Make sure you discuss any questions you have with your health care provider. Document Released: 02/17/2009 Document Revised: 09/29/2015 Document Reviewed: 05/28/2014 Elsevier Interactive Patient Education  2017 [employer].

## 2021-04-04 ENCOUNTER — Other Ambulatory Visit: Payer: Medicare Other

## 2021-04-11 ENCOUNTER — Encounter: Payer: Medicare Other | Admitting: Family Medicine

## 2021-06-11 ENCOUNTER — Other Ambulatory Visit: Payer: Self-pay | Admitting: Family Medicine

## 2021-06-11 DIAGNOSIS — I2510 Atherosclerotic heart disease of native coronary artery without angina pectoris: Secondary | ICD-10-CM

## 2021-06-11 DIAGNOSIS — Z125 Encounter for screening for malignant neoplasm of prostate: Secondary | ICD-10-CM

## 2021-06-13 ENCOUNTER — Other Ambulatory Visit: Payer: Medicare Other

## 2021-06-16 ENCOUNTER — Other Ambulatory Visit: Payer: Medicare Other

## 2021-06-20 ENCOUNTER — Encounter: Payer: Medicare Other | Admitting: Family Medicine

## 2021-07-05 ENCOUNTER — Telehealth: Payer: Self-pay | Admitting: *Deleted

## 2021-07-05 NOTE — Telephone Encounter (Signed)
Spoke with Stephen Hanna about scheduling yearly Lung CA CT Scan. He requested we call back in a couple of months. He stated he has a physical 07/18/2021 and would like to wait after that to schedule. ?

## 2021-07-14 ENCOUNTER — Other Ambulatory Visit: Payer: Self-pay

## 2021-07-14 ENCOUNTER — Other Ambulatory Visit (INDEPENDENT_AMBULATORY_CARE_PROVIDER_SITE_OTHER): Payer: Medicare Other

## 2021-07-14 DIAGNOSIS — I2510 Atherosclerotic heart disease of native coronary artery without angina pectoris: Secondary | ICD-10-CM

## 2021-07-14 DIAGNOSIS — I2584 Coronary atherosclerosis due to calcified coronary lesion: Secondary | ICD-10-CM

## 2021-07-14 DIAGNOSIS — Z125 Encounter for screening for malignant neoplasm of prostate: Secondary | ICD-10-CM

## 2021-07-14 LAB — LIPID PANEL
Cholesterol: 146 mg/dL (ref 0–200)
HDL: 39.1 mg/dL (ref >39.00)
LDL Cholesterol: 89 mg/dL (ref 0–99)
NonHDL: 106.45
Total CHOL/HDL Ratio: 4
Triglycerides: 86 mg/dL (ref 0.0–149.0)
VLDL: 17.2 mg/dL (ref 0.0–40.0)

## 2021-07-14 LAB — COMPREHENSIVE METABOLIC PANEL
ALT: 20 U/L (ref 0–53)
AST: 18 U/L (ref 0–37)
Albumin: 4.1 g/dL (ref 3.5–5.2)
Alkaline Phosphatase: 54 U/L (ref 39–117)
BUN: 20 mg/dL (ref 6–23)
CO2: 31 mEq/L (ref 19–32)
Calcium: 9.2 mg/dL (ref 8.4–10.5)
Chloride: 104 mEq/L (ref 96–112)
Creatinine, Ser: 0.95 mg/dL (ref 0.40–1.50)
GFR: 82.62 mL/min (ref >60.00)
Glucose, Bld: 101 mg/dL — ABNORMAL HIGH (ref 70–99)
Potassium: 4.2 mEq/L (ref 3.5–5.1)
Sodium: 142 mEq/L (ref 135–145)
Total Bilirubin: 0.5 mg/dL (ref 0.2–1.2)
Total Protein: 6 g/dL (ref 6.0–8.3)

## 2021-07-14 LAB — PSA, MEDICARE: PSA: 1.76 ng/ml (ref 0.10–4.00)

## 2021-07-18 ENCOUNTER — Ambulatory Visit (INDEPENDENT_AMBULATORY_CARE_PROVIDER_SITE_OTHER): Payer: Medicare Other | Admitting: Family Medicine

## 2021-07-18 ENCOUNTER — Ambulatory Visit (INDEPENDENT_AMBULATORY_CARE_PROVIDER_SITE_OTHER)
Admission: RE | Admit: 2021-07-18 | Discharge: 2021-07-18 | Disposition: A | Discharge status: Home / Self Care | Payer: Medicare Other | Source: Ambulatory Visit | Attending: Family Medicine | Admitting: Family Medicine

## 2021-07-18 ENCOUNTER — Other Ambulatory Visit: Payer: Self-pay

## 2021-07-18 ENCOUNTER — Encounter: Payer: Self-pay | Admitting: Family Medicine

## 2021-07-18 VITALS — BP 120/80 | HR 56 | Temp 97.9°F | Ht 74.0 in | Wt 218.0 lb

## 2021-07-18 DIAGNOSIS — Z7189 Other specified counseling: Secondary | ICD-10-CM

## 2021-07-18 DIAGNOSIS — Z87891 Personal history of nicotine dependence: Secondary | ICD-10-CM

## 2021-07-18 DIAGNOSIS — Z0000 Encounter for general adult medical examination without abnormal findings: Secondary | ICD-10-CM

## 2021-07-18 DIAGNOSIS — N200 Calculus of kidney: Secondary | ICD-10-CM

## 2021-07-18 DIAGNOSIS — R0789 Other chest pain: Secondary | ICD-10-CM | POA: Diagnosis not present

## 2021-07-18 DIAGNOSIS — E785 Hyperlipidemia, unspecified: Secondary | ICD-10-CM | POA: Diagnosis not present

## 2021-07-18 MED ORDER — ATORVASTATIN CALCIUM 20 MG PO TABS: 10.0000 mg | ORAL_TABLET | Freq: Every day | ORAL | Status: DC

## 2021-07-18 MED ORDER — PRAVASTATIN SODIUM 10 MG PO TABS: 10.0000 mg | ORAL_TABLET | Freq: Every day | ORAL | 3 refills | Status: DC

## 2021-07-18 NOTE — Progress Notes (Signed)
This visit occurred during the SARS-CoV-2 public health emergency.  Safety protocols were in place, including screening questions prior to the visit, additional usage of staff PPE, and extensive cleaning of exam room while observing appropriate contact time as indicated for disinfecting solutions. ? ?Rib pain/R sided chest wall pain.  He was working on [employer], operational at work, hit his R rib on [employer].  Then did heavy shoveling at work. Pain with cough.  Focally ttp on R lateral chest wall.  He may have re aggravated it at work again.   ? ?He has residual numbness L inguinal area at baseline.  He has some soreness at prev hernia site on the abdominal wall.   ? ?He is still using nicotine gum, has been for years.  D/w pt.  He is using up to 8 pieces of gum a day.  D/w pt about gradually tapering nicotine use.  Discussed that it likely does not make sense to change medications at this point but just try to slowly taper nicotine use. ? ?Elevated Cholesterol: ?Using medications without problems: see below.   ?Muscle aches: he tried taking '10mg'$  lipitor a day but still has aches, d/w pt.   ?Diet compliance: yes ?Exercise: yes.  ?Labs d/w pt.   ? ?Still on indapamide at baseline per urology.  No recent renal stones.   ? ?Flu d/w pt. ?Shingles discussed with patient ?PNA 2020 ?Tetanus 2016 ?covid 2021 ?Colonoscopy 2016 ?Prostate cancer screening 2023 ?Advance directive-wife designated if patient were incapacitated. ?AAA screening neg 2020 ?Prev lung cancer screening protocol d/w pt.  He can update me as needed.   ? ?PMH and SH reviewed ? ?Meds, vitals, and allergies reviewed.  ? ?ROS: Per HPI unless specifically indicated in ROS section  ? ?GEN: nad, alert and oriented ?HEENT: ncat ?NECK: supple w/o LA ?CV: rrr. ?PULM: ctab, no inc wob ?ABD: soft, +bs ?EXT: no edema ?SKIN: no acute rash ?R chest wall ttp, focally at the R lower anterior ribs.   ? ?30 minutes were devoted to patient care in this encounter (this includes time  spent reviewing the patient's file/history, interviewing and examining the patient, counseling/reviewing plan with patient).  ? ?

## 2021-07-18 NOTE — Patient Instructions (Addendum)
Let me know if you have trouble getting scheduled for the lung cancer screening program. ? ?Stop atorvastatin for 2 weeks.  Make sure the aches get better.  If so, then try taking pravastatin '10mg'$  at night.   ? ?Go to the lab on the way out.   If you have mychart we'll likely use that to update you.    ?

## 2021-07-19 DIAGNOSIS — R0789 Other chest pain: Secondary | ICD-10-CM | POA: Insufficient documentation

## 2021-07-19 NOTE — Assessment & Plan Note (Signed)
History of.  Continue indapamide.  No symptoms recently. ?

## 2021-07-19 NOTE — Assessment & Plan Note (Signed)
No reason to suspect an ominous diagnosis but reasonable to check chest x-ray.  See notes on imaging. ?

## 2021-07-19 NOTE — Assessment & Plan Note (Signed)
Discussed gradually tapering nicotine gum.  He can update me as needed. ?

## 2021-07-19 NOTE — Assessment & Plan Note (Signed)
Flu d/w pt. ?Shingles discussed with patient ?PNA 2020 ?Tetanus 2016 ?covid 2021 ?Colonoscopy 2016 ?Prostate cancer screening 2023 ?Advance directive-wife designated if patient were incapacitated. ?AAA screening neg 2020 ?Prev lung cancer screening protocol d/w pt.  He can update me as needed.   ?

## 2021-07-19 NOTE — Assessment & Plan Note (Signed)
He is having some aches on Lipitor.  We talked about stopping it to make sure the aches resolved and then if he is feeling well he could try taking pravastatin and then update me as needed.  Rationale discussed with patient, especially given previous arterial calcification noted on imaging. ?

## 2021-07-19 NOTE — Assessment & Plan Note (Signed)
Advance directive- wife designated if patient were incapacitated.  

## 2021-07-21 ENCOUNTER — Telehealth: Payer: Self-pay | Admitting: Family Medicine

## 2021-07-21 NOTE — Telephone Encounter (Addendum)
Patient notified of results and states he is still sore; no changes. Also you wanted patient to have the number to the lung cancer screening; was patient needing to have this done? There are no orders in the EMR ?

## 2021-07-21 NOTE — Telephone Encounter (Signed)
Pt called stating he would like the results of his Xray. Pt would like a call back to discuss. Please advise. ?

## 2021-07-21 NOTE — Telephone Encounter (Signed)
Results are not resulted and patient would like to discuss. ?

## 2021-07-21 NOTE — Telephone Encounter (Signed)
Lung cancer screening is done at [facility] pulmonary. I called and left message on patients VM as requested with their number to call and schedule. I advised patient that if he has any issues to give [place] a call back. ?

## 2021-07-21 NOTE — Telephone Encounter (Signed)
He shouldn't need an order from me about the lung cancer screening program.  He should be able to contact them and they'll put in the order.   ? ?As long as the pain isn't worse, then I suspect he will gradually start to improve in the next few days.  If it is worse or not getting any better by early next week then let me know.  Thanks.  ?

## 2021-07-21 NOTE — Telephone Encounter (Signed)
Please call pt.  CXR looks good.  His lung fields and ribs look fine.  Please let me know how he is feeling. Thanks.  ?

## 2021-08-02 ENCOUNTER — Other Ambulatory Visit: Payer: Self-pay | Admitting: *Deleted

## 2021-08-02 DIAGNOSIS — Z87891 Personal history of nicotine dependence: Secondary | ICD-10-CM

## 2021-08-17 ENCOUNTER — Ambulatory Visit
Admission: RE | Admit: 2021-08-17 | Discharge: 2021-08-17 | Disposition: A | Discharge status: Home / Self Care | Payer: Medicare Other | Source: Ambulatory Visit | Attending: Acute Care | Admitting: Acute Care

## 2021-08-17 DIAGNOSIS — Z87891 Personal history of nicotine dependence: Secondary | ICD-10-CM | POA: Diagnosis not present

## 2021-08-18 ENCOUNTER — Telehealth: Payer: Self-pay | Admitting: Acute Care

## 2021-08-18 NOTE — Telephone Encounter (Signed)
Spoke with Malachy Mood with [facility]- calling for CT done 08/17/21  ?Impression: ? ?IMPRESSION: ?1. New aggressive appearing right upper lobe pulmonary nodule ?categorized as Lung-RADS 4BS, suspicious. Additional imaging ?evaluation or consultation with Pulmonology or Thoracic Surgery ?recommended. ?2. The "S" modifier above refers to potentially clinically ?significant non lung cancer related findings. Specifically, there is ?aortic atherosclerosis, in addition to left main and three-vessel ?coronary artery disease. Please note that although the presence of ?coronary artery calcium documents the presence of coronary artery ?disease, the severity of this disease and any potential stenosis ?cannot be assessed on this non-gated CT examination. Assessment for ?potential risk factor modification, dietary therapy or pharmacologic ?therapy may be warranted, if clinically indicated. ?3. Mild diffuse bronchial wall thickening with very mild ?centrilobular and paraseptal emphysema; imaging findings suggestive ?of underlying COPD. ?  ?These results will be called to the ordering clinician or ?representative by the Radiologist Assistant, and communication ?documented in the PACS or [employer]. ?  ?Aortic Atherosclerosis (ICD10-I70.0) and Emphysema (ICD10-J43.9). ?  ?  ?Electronically Signed ?  By: Vinnie Langton M.D. ?  On: 08/18/2021 08:48 ?

## 2021-08-20 ENCOUNTER — Telehealth: Payer: Self-pay | Admitting: Family Medicine

## 2021-08-20 NOTE — Telephone Encounter (Signed)
Has this been addressed?  Please let me know.  Thanks.   ?======================================================== ?New aggressive appearing right upper lobe pulmonary nodule ?categorized as Lung-RADS 4BS, suspicious. Additional imaging ?evaluation or consultation with Pulmonology or Thoracic Surgery ?recommended. ?

## 2021-08-21 ENCOUNTER — Telehealth: Payer: Self-pay | Admitting: Acute Care

## 2021-08-21 ENCOUNTER — Other Ambulatory Visit: Payer: Self-pay | Admitting: Acute Care

## 2021-08-21 ENCOUNTER — Telehealth: Payer: Self-pay | Admitting: Pulmonary Disease

## 2021-08-21 DIAGNOSIS — R911 Solitary pulmonary nodule: Secondary | ICD-10-CM

## 2021-08-21 NOTE — Telephone Encounter (Signed)
Will schedule OV once PET has been scheduled.  

## 2021-08-21 NOTE — Telephone Encounter (Signed)
Appt scheduled 09/19/2021 at 2:00. ?Will close encounter.  ?

## 2021-08-21 NOTE — Telephone Encounter (Signed)
I have called the patient with the results of his low dose Ct Chest. I explained that his scan was read as a Lung RADS 4 B indicates suspicious findings for which additional diagnostic testing and or tissue sampling is recommended. There is a new aggressive appearing 9.7 mm nodule in the RUL. I explained that the next step is to better evaluate the nodule with a PET scan which gives [place] a better idea of whether or not the nodule is growing. Then follow up with Dr. Patsey Berthold in the [place] office to determine next steps based on the scan results. He is in agreement with this plan. ? ?I have placed the order for the PET scan.  ? ?Lesleigh Noe, can you please schedule patient with Dr. Darnell Level. After the PET scan? ( Hopefully it will get scheduled today)  ?Langley Gauss , Dr. Damita Dunnings is already aware of the results of the scan, and I have added him to this Telephone note.We will need to follow his results.  ? ?Thanks so much ? ?

## 2021-08-21 NOTE — Telephone Encounter (Signed)
Noted  

## 2021-08-21 NOTE — Telephone Encounter (Signed)
Stephen Hanna contacted patient to schedule PET. PET has been scheduled and patient is aware.  ?Will close encounter.  ?

## 2021-08-21 NOTE — Telephone Encounter (Signed)
Magdalen Spatz, NP  You 13 minutes ago (7:57 AM)  ? ?Yes, I will call the patient today. He will most likely get a PET scan and then follow up with one of the [facility] who does biopsies. I will keep you posted.   ? ?============== ?Many thanks and please let me know if I can be of service.  I appreciate your help.  ?

## 2021-08-21 NOTE — Telephone Encounter (Signed)
Noted. Thanks.

## 2021-09-01 ENCOUNTER — Ambulatory Visit (HOSPITAL_COMMUNITY): Payer: Medicare Other

## 2021-09-07 ENCOUNTER — Ambulatory Visit (HOSPITAL_COMMUNITY)
Admission: RE | Admit: 2021-09-07 | Discharge: 2021-09-07 | Disposition: A | Discharge status: Home / Self Care | Payer: Medicare Other | Source: Ambulatory Visit | Attending: Acute Care | Admitting: Acute Care

## 2021-09-07 DIAGNOSIS — R911 Solitary pulmonary nodule: Secondary | ICD-10-CM | POA: Diagnosis not present

## 2021-09-07 DIAGNOSIS — K5730 Diverticulosis of large intestine without perforation or abscess without bleeding: Secondary | ICD-10-CM | POA: Diagnosis not present

## 2021-09-07 DIAGNOSIS — J984 Other disorders of lung: Secondary | ICD-10-CM | POA: Diagnosis not present

## 2021-09-07 DIAGNOSIS — E041 Nontoxic single thyroid nodule: Secondary | ICD-10-CM | POA: Diagnosis not present

## 2021-09-07 DIAGNOSIS — I2510 Atherosclerotic heart disease of native coronary artery without angina pectoris: Secondary | ICD-10-CM | POA: Diagnosis not present

## 2021-09-07 LAB — GLUCOSE, CAPILLARY: Glucose-Capillary: 121 mg/dL — ABNORMAL HIGH (ref 70–99)

## 2021-09-07 MED ORDER — FLUDEOXYGLUCOSE F - 18 (FDG) INJECTION
10.5000 | Freq: Once | INTRAVENOUS | Status: AC
  Administered 2021-09-07: 10.45 via INTRAVENOUS

## 2021-09-12 ENCOUNTER — Encounter: Payer: Self-pay | Admitting: Family Medicine

## 2021-09-12 ENCOUNTER — Ambulatory Visit (INDEPENDENT_AMBULATORY_CARE_PROVIDER_SITE_OTHER): Payer: Medicare Other | Admitting: Family Medicine

## 2021-09-12 ENCOUNTER — Telehealth: Payer: Self-pay | Admitting: Family Medicine

## 2021-09-12 VITALS — BP 120/70 | HR 70 | Temp 98.2°F | Ht 74.0 in | Wt 209.0 lb

## 2021-09-12 DIAGNOSIS — E041 Nontoxic single thyroid nodule: Secondary | ICD-10-CM

## 2021-09-12 DIAGNOSIS — R911 Solitary pulmonary nodule: Secondary | ICD-10-CM | POA: Diagnosis not present

## 2021-09-12 DIAGNOSIS — E785 Hyperlipidemia, unspecified: Secondary | ICD-10-CM

## 2021-09-12 DIAGNOSIS — R059 Cough, unspecified: Secondary | ICD-10-CM

## 2021-09-12 MED ORDER — ALBUTEROL SULFATE HFA 108 (90 BASE) MCG/ACT IN AERS: 1.0000 | INHALATION_SPRAY | Freq: Four times a day (QID) | RESPIRATORY_TRACT | 1 refills | Status: DC | PRN

## 2021-09-12 MED ORDER — DOXYCYCLINE HYCLATE 100 MG PO TABS: 100.0000 mg | ORAL_TABLET | Freq: Two times a day (BID) | ORAL | 1 refills | Status: DC

## 2021-09-12 NOTE — Telephone Encounter (Signed)
Pt was seen today on 09/12/2021 with Dr. Damita Dunnings, he requested a referral but he wants to change the name and location for that referral of CARDIOLOGY. Only knows the last name of the person, but not the location. Wants to speak with the nurse. ? ?Callback Number: [phone] ? ?  ?

## 2021-09-12 NOTE — Progress Notes (Signed)
Aches on pravastatin.  D/w pt about cessation.   D/w pt about lipid clinic eval. referral placed. ? ?Discussed recent CT and pulmonary follow-up: ? ?1. The medial right upper lobe nodule has a maximum SUV of 1.2, and ?has undergone composition ule transition with part of the nodule now ?being subsolid, and with a residual 5 mm in diameter solid ?component. This reduction in density of a substantial portion of the ?nodule combined with the low activity level is moderately reassuring ?of a benign etiology. The nodule has not completely resolved, and I ?would recommend follow up chest CT in 3-6 months time in order to ?reassess morphology. ?2. New volume loss and ill-defined nodularity in the left lower lobe ?with maximum SUV 2.5. This was not present on 08/17/2021 and ?accordingly is felt to be due to inflammation/atelectasis. ?3. Vertically oriented activity in the midline of the prostate gland ?is most compatible with excreted FDG in the prostatic urethra. There ?is underlying prostatomegaly. ?4. Mildly hypermetabolic 1.5 by 0.9 cm nodule extending inferiorly ?from the thyroid isthmus in the midline. Recommend thyroid US and ?biopsy (ref: J Am Coll Radiol. 2015 Feb;12(2): 143-50). ?5. Other imaging findings of potential clinical significance: Aortic ?Atherosclerosis (ICD10-I70.0). Coronary atherosclerosis. Sigmoid ?colon diverticulosis. ?  ?Discussed with patient about getting thyroid biopsy done. ? ?Cough/sx started prior to PET scan.  Sx started 09/06/21.  Initial sx: cough with green sputum.  Rhinorrhea, wheeze.  Cough.  No fevers, chills.  No facial pain.  Prev L ear pain, resolved now.  Still with discolored sputum.  Can take a deep breath.  Not SOB walking.  No loss of taste smell.  Dec in appetite.  No one else is sick at home.   ? ?Meds, vitals, and allergies reviewed.  ? ?ROS: Per HPI unless specifically indicated in ROS section  ? ?Nad ?Ncat, TM wnl, OP wnl, nasal exam wnl.  Sinuses not ttp.  ?Neck supple,  no LA ?Rrr ?Ctab except for Faint B exp wheeze   ?Dry cough noted.  ?Abd soft, not ttp ?Ext well perfused.  ?

## 2021-09-12 NOTE — Patient Instructions (Addendum)
Stop pravastatin and let me know if the aches don't get better.   ?I'll await the notes from pulmonary.  See if you can get on the cancellation list at pulmonary to get moved sooner.   ? ?I'll check on the thyroid ultrasound/biopsy.  ? ?Take care.  Glad to see you. ? ?Use albuterol if needed for the cough.  Start doxycycline.  If you have return of symptoms in the future then restart doxy and call the triage line.  ?

## 2021-09-12 NOTE — Telephone Encounter (Signed)
Called patient back and patient would like to see Dr. Clayborn Bigness at [facility] ?

## 2021-09-13 DIAGNOSIS — R911 Solitary pulmonary nodule: Secondary | ICD-10-CM | POA: Insufficient documentation

## 2021-09-13 DIAGNOSIS — E041 Nontoxic single thyroid nodule: Secondary | ICD-10-CM | POA: Insufficient documentation

## 2021-09-13 DIAGNOSIS — R059 Cough, unspecified: Secondary | ICD-10-CM | POA: Insufficient documentation

## 2021-09-13 NOTE — Addendum Note (Signed)
Addended by: Tonia Ghent on: 09/13/2021 12:20 PM ? ? Modules accepted: Orders ? ?

## 2021-09-13 NOTE — Assessment & Plan Note (Signed)
Stop pravastatin.  Refer to cardiology.  Need cardiology input on nonstatin alternatives. ?

## 2021-09-13 NOTE — Assessment & Plan Note (Signed)
Refer for thyroid biopsy.  Order placed. ?

## 2021-09-13 NOTE — Telephone Encounter (Addendum)
Referral changed.  Thanks.

## 2021-09-13 NOTE — Assessment & Plan Note (Signed)
Discussed recent imaging and he will follow-up with pulmonary.  He is going to call to see if he can get seen sooner via the cancellation list. ?

## 2021-09-13 NOTE — Assessment & Plan Note (Signed)
He is already out of the window for fluid treatment or COVID treatments I think it makes sense to defer testing.  He is already passed the quarantine point.  I think it makes sense to preemptively treat him given his history.  Start doxycycline.  Use albuterol as needed.  Routine cautions given to patient on both.  He will update me as needed. ?

## 2021-09-19 ENCOUNTER — Ambulatory Visit: Payer: Medicare Other | Admitting: Pulmonary Disease

## 2021-09-19 ENCOUNTER — Other Ambulatory Visit: Payer: Self-pay

## 2021-09-19 ENCOUNTER — Encounter: Payer: Self-pay | Admitting: Pulmonary Disease

## 2021-09-19 VITALS — BP 126/72 | HR 87 | Temp 98.0°F | Ht 74.0 in | Wt 213.8 lb

## 2021-09-19 DIAGNOSIS — Z87891 Personal history of nicotine dependence: Secondary | ICD-10-CM

## 2021-09-19 DIAGNOSIS — J069 Acute upper respiratory infection, unspecified: Secondary | ICD-10-CM | POA: Diagnosis not present

## 2021-09-19 DIAGNOSIS — R911 Solitary pulmonary nodule: Secondary | ICD-10-CM | POA: Diagnosis not present

## 2021-09-19 NOTE — Patient Instructions (Signed)
We going to get another CT scan in 3 months to make sure all the findings have resolved. ? ?Complete your antibiotics. ? ?We will see you here on an as-needed basis. ?

## 2021-09-19 NOTE — Progress Notes (Signed)
Subjective:    Patient ID: Stephen Hanna, male    DOB: 11/21/1955, 66 y.o.   MRN: [id number] Patient Care Team: Tonia Ghent, MD as PCP - General (Family Medicine) Alda Berthold, DO as Consulting Physician (Neurology) Tonia Ghent, MD (Family Medicine)  Chief Complaint  Patient presents with   pulmonary consult    PET-recently tx with doxy. C/o prod cough clear sputum, wheezing and SOB with exertion.    HPI Patient is a 66 year old former smoker (81 PY) who presents for evaluation of incidental lung nodule noted on low-dose cancer screening done on 17 August 2021.  Patient is kindly referred by Dr. Elsie Stain and the lung cancer screening program.  Patient was noted to have a "9.7 mm aggressive appearing nodule" in the subpleural area of the right upper lobe.  The patient has been asymptomatic with regards to this finding, he has not had any weight loss or anorexia, no hemoptysis, he has had some cough and sputum production and currently completed doxycycline prescribed for an upper respiratory infection.  Patient underwent PET/CT on 07 Sep 2021 which actually showed diminution of this nodule to 5 mm and low to no significant SUV activity.  This prompted the recommendation of a follow-up CT in 3 months.  The patient quit smoking in 2016.  He does not endorse issues with dyspnea.  Occasional notices some wheeze only when he has "bronchitis".  Uses albuterol only rarely.  Is not on any other maintenance inhalers and does not feel he needs one.  Patient is retired with no occupational exposures previously.   Review of Systems A 10 point review of systems was performed and it is as noted above otherwise negative.  Past Medical History:  Diagnosis Date   Arthritis    LEFT SHOULDER   Back pain, chronic    after fall from ladder in 1980s with episodic flares   BPH (benign prostatic hyperplasia)    Diverticulitis 10/2010   divertics one small polyp (Dr. Fuller Plan)   GERD  (gastroesophageal reflux disease)    RARE   History of kidney stones    Hyperglycemia    Hyperlipidemia    Hypertension    H/O   IBS (irritable bowel syndrome)    diarrhea predominant   Personal history of kidney stones 02/07/2005   CT ABD/pelvis --Diverticulitis sig left renal stone // CT ABD w/o negative stones + multiple aortic L.N. 07/24/2004//   Past Surgical History:  Procedure Laterality Date   CHOLECYSTECTOMY     CYSTOSCOPY W/ RETROGRADES Right 03/01/2020   Procedure: CYSTOSCOPY WITH RETROGRADE PYELOGRAM;  Surgeon: Abbie Sons, MD;  Location: [facility] ORS;  Service: Urology;  Laterality: Right;   CYSTOSCOPY WITH LITHOLAPAXY N/A 03/01/2020   Procedure: CYSTOSCOPY WITH LITHOLAPAXY;  Surgeon: Abbie Sons, MD;  Location: [facility] ORS;  Service: Urology;  Laterality: N/A;   CYSTOSCOPY/URETEROSCOPY/HOLMIUM LASER/STENT PLACEMENT Right 03/01/2020   Procedure: CYSTOSCOPY/URETEROSCOPY/HOLMIUM LASER/STENT PLACEMENT;  Surgeon: Abbie Sons, MD;  Location: [facility] ORS;  Service: Urology;  Laterality: Right;   LAPAROSCOPIC CHOLECYSTECTOMY W/ CHOLANGIOGRAPHY  12/27/2008   Ct abd/pelvis w/o no gallstones mild dilation entire right ureter lap choley (Dr. Bary Castilla)   [place] PARAESOPHAGEAL HERNIA REPAIR N/A 09/03/2019   Procedure: XI ROBOTIC ASSISTED Diaphragmatic hernia REPAIR;  Surgeon: Jules Husbands, MD;  Location: [facility] ORS;  Service: General;  Laterality: N/A;   XI ROBOTIC ASSISTED VENTRAL HERNIA N/A 02/04/2020   Procedure: XI ROBOTIC ASSISTED VENTRAL  HERNIA;  Surgeon: Jules Husbands, MD;  Location: [facility] ORS;  Service: General;  Laterality: N/A;   Patient Active Problem List   Diagnosis Date Noted   Cough 09/13/2021   Thyroid nodule 09/13/2021   Lung nodule 09/13/2021   Chest wall pain 07/19/2021   Healthcare maintenance 04/06/2020   Bladder calculi 02/18/2020   Diaphragmatic hernia 08/12/2019   BPH (benign prostatic hyperplasia) 08/12/2019   Facial  paresthesia 03/05/2019   Coronary artery calcification 03/26/2018   Wheeze 10/30/2016   Personal history of tobacco use, presenting hazards to health 02/24/2016   Pain in joint, ankle and foot 02/10/2016   Former smoker 02/10/2016   History of hypertension 05/16/2014   Advance care planning 01/28/2014   Pain in joint, shoulder region 01/28/2014   Pain in right knee 03/09/2013   Skin thickening 01/26/2013   Fatigue 01/04/2011   IRRITABLE BOWEL SYNDROME 06/22/2010   Recurrent nephrolithiasis 11/23/2008   BENIGN PROSTATIC HYPERTROPHY, MILD, HX OF 10/14/2008   HLD (hyperlipidemia) 10/06/2008   COLONIC POLYPS 10/08/2007   DIVERTICULOSIS OF COLON 10/08/2007   Hyperglycemia 10/08/2007   BACK PAIN, LUMBAR 01/01/2007   Family History  Problem Relation Age of Onset   Heart disease Father 9       MI   Kidney disease Father    Depression Maternal Grandmother    Alcohol abuse Neg Hx    Stroke Neg Hx    Prostate cancer Neg Hx    Colon cancer Neg Hx    Social History   Tobacco Use   Smoking status: Former    Packs/day: 1.00    Years: 30.00    Total pack years: 30.00    Types: Cigarettes    Quit date: 01/28/2015    Years since quitting: 6.8   Smokeless tobacco: Never   Tobacco comments:    quit 6 years ago  Substance Use Topics   Alcohol use: Yes    Alcohol/week: 1.0 standard drink of alcohol    Types: 1 Standard drinks or equivalent per week    Comment: 1 drink a month   Allergies  Allergen Reactions   Atorvastatin     Aches at '30mg'$  but not '20mg'$  a day.    Chantix [Varenicline]     Abnormal dreams   Hydrocodone-Acetaminophen Other (See Comments)    edgy, intolerant   Lisinopril Other (See Comments)    cough   Pravastatin     Aches   Current Meds  Medication Sig   albuterol (VENTOLIN HFA) 108 (90 Base) MCG/ACT inhaler Inhale 1-2 puffs into the lungs every 6 (six) hours as needed for wheezing (or for cough. okay to fill with albuterol/ventolin/proair).   indapamide  (LOZOL) 2.5 MG tablet TAKE 1 TABLET BY MOUTH EVERY DAY   naproxen (NAPROSYN) 250 MG tablet Take 250 mg by mouth daily.   oxymetazoline (NASAL SPRAY MOISTURIZING 12 HR) 0.05 % nasal spray Place 1 spray into both nostrils 2 (two) times daily as needed for congestion. D& J nasal spray   psyllium (METAMUCIL) 58.6 % packet Take 1 packet by mouth daily.   vitamin B-12 (CYANOCOBALAMIN) 1000 MCG tablet Take 1,000 mcg by mouth daily.   [DISCONTINUED] doxycycline (VIBRA-TABS) 100 MG tablet Take 1 tablet (100 mg total) by mouth 2 (two) times daily. (Patient not taking: Reported on 12/20/2021)   Immunization History  Administered Date(s) Administered   Fluad Quad(high Dose 65+) 03/02/2019, 04/05/2020   Influenza Split 04/11/2011   Influenza Whole 04/17/2007, 02/10/2008   Influenza,inj,Quad PF,6+ Mos  01/26/2013, 01/28/2014, 02/01/2015, 02/11/2017, 02/17/2018   Moderna Sars-Covid-2 Vaccination 11/06/2019, 12/12/2019   Pneumococcal Polysaccharide-23 03/02/2019   Td 05/07/2004   Tdap 02/01/2015   Zoster, Live 01/28/2014       Objective:   Physical Exam BP 126/72 (BP Location: Left Arm, Cuff Size: Normal)   Pulse 87   Temp 98 F (36.7 C) (Temporal)   Ht '6\' 2"'$  (1.88 m)   Wt 213 lb 12.8 oz (97 kg)   SpO2 95%   BMI 27.45 kg/m ' GENERAL: Well-developed, well-nourished gentleman, fit appearing, fully ambulatory, no acute distress.  No conversational dyspnea. HEAD: Normocephalic, atraumatic.  EYES: Pupils equal, round, reactive to light.  No scleral icterus.  MOUTH: Oral mucosa moist.  No thrush.  Dentition intact. NECK: Supple. No thyromegaly. Trachea midline. No JVD.  No adenopathy. PULMONARY: Good air entry bilaterally.  Minimal rhonchi noted, otherwise no adventitious sounds. CARDIOVASCULAR: S1 and S2. Regular rate and rhythm.  No rubs, murmurs or gallops heard. ABDOMEN: Benign. MUSCULOSKELETAL: No joint deformity, no clubbing, no edema.  NEUROLOGIC: No overt focal deficit, no gait disturbance,  speech is fluent. SKIN: Intact,warm,dry. PSYCH: Mood and behavior normal.  Image from 17 December 2021 CT showing 9.7 mm nodule right upper lobe (arrow):   Image from PET/CT performed 07 Sep 2021 showing decrease in size of said nodule to 5 mm:      Assessment & Plan:     ICD-10-CM   1. Lung nodule seen on imaging study  R91.1    The lung nodule in question appears to be REGRESSING Repeat CT chest in 3 months Likely inflammatory/infectious    2. Recent URI  J06.9    Completed doxycycline course Relevant vis--vis lung nodule    3. Former cigarette smoker  Z87.891    No evidence of relapse     Follow-up CT will be obtained through the lung cancer screening program.  We will see the patient in 3 months time after CT is done.  Call sooner should any new problems arise.  Renold Don, MD Advanced Bronchoscopy PCCM [facility]    *This note was dictated using voice recognition software/Dragon.  Despite best efforts to proofread, errors can occur which can change the meaning. Any transcriptional errors that result from this process are unintentional and may not be fully corrected at the time of dictation.

## 2021-09-25 ENCOUNTER — Telehealth: Payer: Self-pay | Admitting: Family Medicine

## 2021-09-25 DIAGNOSIS — E041 Nontoxic single thyroid nodule: Secondary | ICD-10-CM

## 2021-09-25 NOTE — Telephone Encounter (Signed)
Referral for Cardiology was sent to requested Cardiologist in [facility]   Dr. Karma Greaser D. Clayborn Bigness, MD Address: [address] [place], [place] [address] Phone: [phone]  Appointment for [place] FNA BX THYROID 1ST LESION AFIRMA [facility] will call to schedule the biopsy. Spoke with [facility] is aware of order in epic)

## 2021-09-25 NOTE — Telephone Encounter (Signed)
Pt called and wanted to know when he would hear from the heart and thyroid doctor to set up an appointment. Pt last saw Dr. Damita Dunnings on 09/12/2021 for cold/sinus symptoms. Please return the call when possible.  Callback Number: [phone]

## 2021-09-25 NOTE — Telephone Encounter (Signed)
Per [place], our IR Drs require that the patients have had a dedicated thyroid US prior to approving this biopsy. They will need images to look at before proceeding. Also, the location will be here at [facility]. Can you ask them to schedule a Thyroid US asap for this pt and then I can send it for review? Thanks  Please advise, thanks. Please let me know in this message once you have ordered the STAT/Urgent Thyroid US

## 2021-09-26 NOTE — Telephone Encounter (Signed)
I put in the order for the thyroid ultrasound.  I did not see the option to make it a stat on the order.  Please notify patient and explain to him the rationale, ie that the interventional radiology doctor needs the thyroid images first.  Thanks.

## 2021-09-26 NOTE — Telephone Encounter (Signed)
The US Thyroid or is in  The patient has been notified. He is not happy with having to have another test done when he just had a PET Scan last week that pinpointed the abnormality of the Thyroid. Declined scheduling the US Thyroid until he knows more about why he is having to have so many tests done. The patient is wanting to know why he is having to have another test and why the PET scan cannot be used. I advised him that I would reach back out to [facility] to see if they can explain this and see if maybe the PET scan results can be used. Aware that I will call him back or the office will call him once this is discussed.   I have called and left a message with Perry Mount [phone] [facility] and requested a call back - I left her the Doc line number and requested that if she called back AFTER 2pm today that she request to speak with Dr Damita Dunnings and/or Janett Billow so that we can get this patient taken care of.

## 2021-09-26 NOTE — Telephone Encounter (Signed)
Called pt.  Cough is better in the meantime.   D/w pt about getting u/s.  We talked about rationale and he understood the point.  He agrees with u/s.  Please schedule.  Please use the phone numbers below, mobile number first then home number if needed.   Thanks.   [phone] Jerilynn Mages)  [phone] (H)

## 2021-09-26 NOTE — Telephone Encounter (Addendum)
Review back from Doctor at [facility]  -   Dr Damita Dunnings, I dont believe that the patient is going to accept this after our conversation earlier. Can you please help explain to the patient  why the dedicated US thyroid is needed in place of the PET scan he had done last week. Per the messages below, the Doctor at [facility] was adamant about needing the [place] first even after reviewing the PET scan. The patient declined scheduling earlier this morning until I had a better explanation of the need for this. Once discussed the patient can call to schedule his Thyroid US ([place]).    FW: ATTN: Dr Lambert Keto   US Biopsy  Wonda Amis, CMA  09/26/2021  Logan Bores,   Here is the review from our Radiologist today, Dr Michaelle Birks, MD.  I went ahead and sent this patient to him for review per your request and this was his response.  If Dr Damita Dunnings needs to discuss this patient further, he can contact Dr Maryelizabeth Kaufmann.  He will be here until 4:30pm today.   Thanks,  Perry Mount        Previous Messages   ----- Message -----  From: Michaelle Birks, MD  Sent: 09/26/2021  12:44 PM EDT  To: Sheryle Spray  Subject: RE: ATTN: Dr Lambert Keto   US Biopsy             Reviewed.  Needs a dedicated US Thyroid before proceeding with any intervention.   Thanks,  Velora Mediate, MD

## 2021-09-27 NOTE — Telephone Encounter (Signed)
Pt is going to call and set up the US Thyroid.   Once this is complete and Dr Damita Dunnings has the results the Biopsy can then be scheduled by the IR dept.   Perry Mount with [facility] Rad is waiting on Biopsy to be scheduled/done

## 2021-09-27 NOTE — Telephone Encounter (Signed)
Soonest appt for Thyroid US 10/03/21  Will follow for scheduling of Bx

## 2021-09-27 NOTE — Telephone Encounter (Signed)
Noted. Thanks.

## 2021-10-03 ENCOUNTER — Ambulatory Visit
Admission: RE | Admit: 2021-10-03 | Discharge: 2021-10-03 | Disposition: A | Discharge status: Home / Self Care | Payer: Medicare Other | Source: Ambulatory Visit | Attending: Family Medicine | Admitting: Family Medicine

## 2021-10-03 DIAGNOSIS — E041 Nontoxic single thyroid nodule: Secondary | ICD-10-CM | POA: Diagnosis not present

## 2021-10-05 ENCOUNTER — Telehealth: Payer: Self-pay

## 2021-10-05 NOTE — Telephone Encounter (Signed)
I spoke with patient about his [place] and looks like bx still is needed. Do you know when they will call him to set that up?

## 2021-10-05 NOTE — Telephone Encounter (Signed)
Patient called back in asking for clarification on his [place] results, states he did not want to hear back from any CMA/Nurse asked for Dr.Duncan to respond back to him.

## 2021-10-06 NOTE — Telephone Encounter (Signed)
I called him before this u/s was done.  Based on prev imaging, he'll need a biopsy.  I explained that to patient prev.   He needed this u/s so the IR department could do his biopsy.  I explained that to patient prev.   I updated his result note in the meantime with the following message "This u/s confirmed the prev seen nodule (nothing new) and the IR department should be able to do the biopsy now.  I routed this to Asht[place] to see about getting the FNA scheduled."  That is the message the patient needs to get.  Apparently, he declined to take that message from staff here.  Please explain to patient that he'll need to be able to communicate with staff here.  If he still has questions, then please schedule a phone visit when possible.  I have no remaining open slots on my schedule today and will be working throughout the day without extra availability.    Thanks.

## 2021-10-06 NOTE — Telephone Encounter (Signed)
Per Perry Mount with [facility] [place], I make sure to keep up with any patients that we are waiting to get biopsies on. I saw that this patient's images came back and that our Rad - Dr Shellia Cleverly read the results and approved him for his FNA Thyroid Bx so I a went ahead and scheduled him. He is on for next Wed 6/7 at 2:30p with an arrival time 2p. If he cannot do this date, just let me know and we can move it. He does NOT have to worry about being NPO or a driver. He should check in at the [facility] registration desk. Thank you and if you have anymore questions, please don't hesitate to let me know.    She is aware that I will let the patient know of this appt date/time

## 2021-10-06 NOTE — Telephone Encounter (Signed)
Noted. Thanks.

## 2021-10-06 NOTE — Telephone Encounter (Signed)
Spoke with patient, made aware of upcoming appt/Bx Also called and left a detailed message on machine per pt request with the appt information.  Nothing further needed.

## 2021-10-11 ENCOUNTER — Ambulatory Visit
Admission: RE | Admit: 2021-10-11 | Discharge: 2021-10-11 | Disposition: A | Discharge status: Home / Self Care | Payer: Medicare Other | Source: Ambulatory Visit | Attending: Family Medicine | Admitting: Family Medicine

## 2021-10-11 VITALS — BP 126/76

## 2021-10-11 DIAGNOSIS — E041 Nontoxic single thyroid nodule: Secondary | ICD-10-CM | POA: Insufficient documentation

## 2021-10-11 DIAGNOSIS — R079 Chest pain, unspecified: Secondary | ICD-10-CM | POA: Diagnosis not present

## 2021-10-11 DIAGNOSIS — I2510 Atherosclerotic heart disease of native coronary artery without angina pectoris: Secondary | ICD-10-CM | POA: Diagnosis not present

## 2021-10-11 DIAGNOSIS — R001 Bradycardia, unspecified: Secondary | ICD-10-CM | POA: Diagnosis not present

## 2021-10-11 NOTE — Procedures (Signed)
Successful US guided FNA of isthmus thyroid nodule No complications. See PACS for full report.  Tsosie Billing D, PA-C 10/11/2021, 3:02 PM

## 2021-10-12 LAB — CYTOLOGY - NON PAP

## 2021-10-16 ENCOUNTER — Telehealth: Payer: Self-pay | Admitting: Family Medicine

## 2021-10-16 NOTE — Telephone Encounter (Signed)
Called and spoke with patient about his results. Patient asked what he needed to do about his thyroid now and I advised patient that Dr. Damita Dunnings needed and little more time to completely review his case and we would call him back with further instructions. Patient got mad and stated that Dr. Damita Dunnings dropped the ball and that he knew what he was talking about there.

## 2021-10-16 NOTE — Telephone Encounter (Signed)
Patients get the report before I have a chance to review it.   NEGATIVE FOR MALIGNANCY-BETHESDA CATEGORY II.    Neg for malignancy means benign, ie not cancer.  I need to review his case about follow up and then I'll send info via staff.  He'll need to be able to take info from staff.  Thanks.

## 2021-10-16 NOTE — Telephone Encounter (Signed)
Patient received thyroid biopsy results and would like to discuss with Dr. Damita Dunnings. Please advise.

## 2021-10-17 NOTE — Telephone Encounter (Addendum)
Please talk to me about this patient so we can address his behavior and move towards discharge if his behavior does not immediately improve.  I do not know what he means by " dropped the ball".    I do know that he has refused to speak with staff here at clinic and that is unacceptable. I have already talked with him by phone about the rationale for his plan leading up to the biopsy and I have coordinated all of that amongst the radiology site, the clinic care, and our referral department.  In the meantime, I sent him a message when he had the biopsy on 10/11/2021, letting him know that we would update him about the pathology results when they were made available to me. I specifically checked incoming results on 6/8 and again on 6/9 (even though I was not in clinic seeing patients and had a vacation day on 6/9) and the results were not available to me at that point.  The results are still not available in my inbox over the weekend and the first day I saw his results was 10/16/2021.  He was notified that day that his results were benign.  I reserve the right to review his case going forward before making a more detailed statement, especially given the complexity of his case.  If he believes this is inadequate care then he should withdraw from the clinic and seek care elsewhere.  If he refuses to be reasonable with me and with staff then he will also need to find another clinic as he will have earned the right to be discharged.  Regarding his results, 1-yr follow-up ultrasound is recommended for Bethesda 2 nodules to document stability.  If the thyroid nodule is unchanged he may not need anything else done.  If it has changed at 1 year he may need repeat biopsy or other evaluation/treatment.

## 2021-10-19 ENCOUNTER — Ambulatory Visit
Admission: RE | Admit: 2021-10-19 | Discharge: 2021-10-19 | Disposition: A | Discharge status: Home / Self Care | Payer: Medicare Other | Source: Ambulatory Visit | Attending: Internal Medicine | Admitting: Internal Medicine

## 2021-10-19 ENCOUNTER — Other Ambulatory Visit: Payer: Self-pay | Admitting: Internal Medicine

## 2021-10-19 DIAGNOSIS — R079 Chest pain, unspecified: Secondary | ICD-10-CM | POA: Diagnosis not present

## 2021-10-19 DIAGNOSIS — I2510 Atherosclerotic heart disease of native coronary artery without angina pectoris: Secondary | ICD-10-CM

## 2021-10-19 DIAGNOSIS — R931 Abnormal findings on diagnostic imaging of heart and coronary circulation: Secondary | ICD-10-CM | POA: Diagnosis not present

## 2021-10-19 LAB — POCT I-STAT CREATININE: Creatinine, Ser: 1 mg/dL (ref 0.61–1.24)

## 2021-10-19 MED ORDER — METOPROLOL TARTRATE 5 MG/5ML IV SOLN
10.0000 mg | Freq: Once | INTRAVENOUS | Status: AC
  Administered 2021-10-19: 10 mg via INTRAVENOUS

## 2021-10-19 MED ORDER — IOHEXOL 350 MG/ML SOLN
75.0000 mL | Freq: Once | INTRAVENOUS | Status: AC | PRN
  Administered 2021-10-19: 75 mL via INTRAVENOUS

## 2021-10-19 MED ORDER — NITROGLYCERIN 0.4 MG SL SUBL
0.8000 mg | SUBLINGUAL_TABLET | Freq: Once | SUBLINGUAL | Status: AC
  Administered 2021-10-19: 0.8 mg via SUBLINGUAL

## 2021-10-20 ENCOUNTER — Other Ambulatory Visit: Payer: Self-pay | Admitting: Internal Medicine

## 2021-10-20 DIAGNOSIS — R931 Abnormal findings on diagnostic imaging of heart and coronary circulation: Secondary | ICD-10-CM

## 2021-10-20 DIAGNOSIS — I2510 Atherosclerotic heart disease of native coronary artery without angina pectoris: Secondary | ICD-10-CM | POA: Diagnosis not present

## 2021-10-25 ENCOUNTER — Telehealth: Payer: Self-pay | Admitting: Family Medicine

## 2021-10-25 NOTE — Telephone Encounter (Addendum)
See below.  To my knowledge all previously pending issues that I can address have been taking care of.  ----- Message from Reginia Naas sent at 10/20/2021  3:00 PM EDT ----- I spoke with the patient today at 1455. I explained the plan at this time based on his bx being negative was to follow up in one year with an ultrasound.  He said he would be here in the spring for his physical and that would help him remember. He was pleasant and appreciated the information.   He asked me to say "hi" to you and wish you a Happy Father's Day.

## 2021-10-30 DIAGNOSIS — I2510 Atherosclerotic heart disease of native coronary artery without angina pectoris: Secondary | ICD-10-CM | POA: Diagnosis not present

## 2021-11-08 DIAGNOSIS — R079 Chest pain, unspecified: Secondary | ICD-10-CM | POA: Diagnosis not present

## 2021-11-08 DIAGNOSIS — I2510 Atherosclerotic heart disease of native coronary artery without angina pectoris: Secondary | ICD-10-CM | POA: Diagnosis not present

## 2021-11-08 DIAGNOSIS — F17200 Nicotine dependence, unspecified, uncomplicated: Secondary | ICD-10-CM | POA: Diagnosis not present

## 2021-11-08 DIAGNOSIS — R001 Bradycardia, unspecified: Secondary | ICD-10-CM | POA: Diagnosis not present

## 2021-11-09 DIAGNOSIS — F17200 Nicotine dependence, unspecified, uncomplicated: Secondary | ICD-10-CM | POA: Diagnosis not present

## 2021-12-01 ENCOUNTER — Ambulatory Visit
Admission: RE | Admit: 2021-12-01 | Discharge: 2021-12-01 | Disposition: A | Discharge status: Home / Self Care | Payer: Medicare Other | Source: Ambulatory Visit | Attending: Acute Care | Admitting: Acute Care

## 2021-12-01 DIAGNOSIS — J439 Emphysema, unspecified: Secondary | ICD-10-CM | POA: Diagnosis not present

## 2021-12-01 DIAGNOSIS — R911 Solitary pulmonary nodule: Secondary | ICD-10-CM | POA: Diagnosis not present

## 2021-12-01 DIAGNOSIS — Z87891 Personal history of nicotine dependence: Secondary | ICD-10-CM | POA: Diagnosis not present

## 2021-12-08 ENCOUNTER — Ambulatory Visit: Payer: Self-pay | Admitting: Urology

## 2021-12-12 ENCOUNTER — Other Ambulatory Visit: Payer: Self-pay | Admitting: *Deleted

## 2021-12-12 DIAGNOSIS — N201 Calculus of ureter: Secondary | ICD-10-CM

## 2021-12-14 ENCOUNTER — Encounter: Payer: Self-pay | Admitting: Urology

## 2021-12-14 ENCOUNTER — Ambulatory Visit
Admission: RE | Admit: 2021-12-14 | Discharge: 2021-12-14 | Disposition: A | Discharge status: Home / Self Care | Payer: Medicare Other | Attending: Urology | Admitting: Urology

## 2021-12-14 ENCOUNTER — Ambulatory Visit: Payer: Medicare Other | Admitting: Urology

## 2021-12-14 ENCOUNTER — Ambulatory Visit
Admission: RE | Admit: 2021-12-14 | Discharge: 2021-12-14 | Disposition: A | Discharge status: Home / Self Care | Payer: Medicare Other | Source: Ambulatory Visit | Attending: Urology | Admitting: Urology

## 2021-12-14 VITALS — BP 112/74 | HR 78 | Ht 75.0 in | Wt 214.0 lb

## 2021-12-14 DIAGNOSIS — N201 Calculus of ureter: Secondary | ICD-10-CM

## 2021-12-14 DIAGNOSIS — N200 Calculus of kidney: Secondary | ICD-10-CM | POA: Diagnosis not present

## 2021-12-14 DIAGNOSIS — R351 Nocturia: Secondary | ICD-10-CM

## 2021-12-14 DIAGNOSIS — N401 Enlarged prostate with lower urinary tract symptoms: Secondary | ICD-10-CM

## 2021-12-14 DIAGNOSIS — R82994 Hypercalciuria: Secondary | ICD-10-CM

## 2021-12-14 DIAGNOSIS — Z87442 Personal history of urinary calculi: Secondary | ICD-10-CM | POA: Diagnosis not present

## 2021-12-14 DIAGNOSIS — I878 Other specified disorders of veins: Secondary | ICD-10-CM | POA: Diagnosis not present

## 2021-12-14 DIAGNOSIS — M47816 Spondylosis without myelopathy or radiculopathy, lumbar region: Secondary | ICD-10-CM | POA: Diagnosis not present

## 2021-12-14 DIAGNOSIS — N2889 Other specified disorders of kidney and ureter: Secondary | ICD-10-CM | POA: Diagnosis not present

## 2021-12-14 NOTE — Progress Notes (Addendum)
12/14/2021 2:16 PM   Stephen Hanna 06-05-1955 [id number]  Referring provider: Tonia Ghent, MD [address] [place],  [place] [address]  Chief Complaint  Patient presents with   Nephrolithiasis    1.  Nephrolithiasis -Ureteroscopic removal of 10 mm right proximal ureteral calculus, 5 mm right lower pole calculus and bladder calculi X3 on 14/97/02 -Metabolic evaluation remarkable for hypercalciuria at 382 mg /24 hours -Started indapamide December 2021   HPI: 66 y.o. male presents for annual follow-up.  Doing well since last visit No bothersome LUTS Denies dysuria, gross hematuria Denies flank, abdominal or pelvic pain Has variable nocturia up to 3 times per night.  He typically drinks a glass of water before bedtime.  Does note increased nocturia with ice tea Remains on indapamide  PMH: Past Medical History:  Diagnosis Date   Arthritis    LEFT SHOULDER   Back pain, chronic    after fall from ladder in 1980s with episodic flares   BPH (benign prostatic hyperplasia)    Diverticulitis 10/2010   divertics one small polyp (Dr. Fuller Plan)   GERD (gastroesophageal reflux disease)    RARE   History of kidney stones    Hyperglycemia    Hyperlipidemia    Hypertension    H/O   IBS (irritable bowel syndrome)    diarrhea predominant   Personal history of kidney stones 02/07/2005   CT ABD/pelvis --Diverticulitis sig left renal stone // CT ABD w/o negative stones + multiple aortic L.N. 07/24/2004//    Surgical History: Past Surgical History:  Procedure Laterality Date   CHOLECYSTECTOMY     CYSTOSCOPY W/ RETROGRADES Right 03/01/2020   Procedure: CYSTOSCOPY WITH RETROGRADE PYELOGRAM;  Surgeon: Abbie Sons, MD;  Location: [facility] ORS;  Service: Urology;  Laterality: Right;   CYSTOSCOPY WITH LITHOLAPAXY N/A 03/01/2020   Procedure: CYSTOSCOPY WITH LITHOLAPAXY;  Surgeon: Abbie Sons, MD;  Location: [facility] ORS;  Service: Urology;  Laterality: N/A;    CYSTOSCOPY/URETEROSCOPY/HOLMIUM LASER/STENT PLACEMENT Right 03/01/2020   Procedure: CYSTOSCOPY/URETEROSCOPY/HOLMIUM LASER/STENT PLACEMENT;  Surgeon: Abbie Sons, MD;  Location: [facility] ORS;  Service: Urology;  Laterality: Right;   LAPAROSCOPIC CHOLECYSTECTOMY W/ CHOLANGIOGRAPHY  12/27/2008   Ct abd/pelvis w/o no gallstones mild dilation entire right ureter lap choley (Dr. Bary Castilla)   [place] PARAESOPHAGEAL HERNIA REPAIR N/A 09/03/2019   Procedure: XI ROBOTIC ASSISTED Diaphragmatic hernia REPAIR;  Surgeon: Jules Husbands, MD;  Location: [facility] ORS;  Service: General;  Laterality: N/A;   XI ROBOTIC ASSISTED VENTRAL HERNIA N/A 02/04/2020   Procedure: XI ROBOTIC ASSISTED VENTRAL HERNIA;  Surgeon: Jules Husbands, MD;  Location: [facility] ORS;  Service: General;  Laterality: N/A;    Home Medications:  Allergies as of 12/14/2021       Reactions   Atorvastatin    Aches at '30mg'$  but not '20mg'$  a day.    Chantix [varenicline]    Abnormal dreams   Hydrocodone-acetaminophen Other (See Comments)   edgy, intolerant   Lisinopril Other (See Comments)   cough   Pravastatin    Aches        Medication List        Accurate as of December 14, 2021  2:16 PM. If you have any questions, ask your nurse or doctor.          albuterol 108 (90 Base) MCG/ACT inhaler Commonly known as: VENTOLIN HFA Inhale 1-2 puffs into the lungs every 6 (six) hours as needed for wheezing (or  for cough. okay to fill with albuterol/ventolin/proair).   cyanocobalamin 1000 MCG tablet Commonly known as: VITAMIN B12 Take 1,000 mcg by mouth daily.   doxycycline 100 MG tablet Commonly known as: VIBRA-TABS Take 1 tablet (100 mg total) by mouth 2 (two) times daily.   indapamide 2.5 MG tablet Commonly known as: LOZOL TAKE 1 TABLET BY MOUTH EVERY DAY   naproxen 250 MG tablet Commonly known as: NAPROSYN Take 250 mg by mouth daily.   Nasal Spray Moisturizing 12 HR 0.05 % nasal spray Generic drug:  oxymetazoline Place 1 spray into both nostrils 2 (two) times daily as needed for congestion. D& J nasal spray   psyllium 58.6 % packet Commonly known as: METAMUCIL Take 1 packet by mouth daily.        Allergies:  Allergies  Allergen Reactions   Atorvastatin     Aches at '30mg'$  but not '20mg'$  a day.    Chantix [Varenicline]     Abnormal dreams   Hydrocodone-Acetaminophen Other (See Comments)    edgy, intolerant   Lisinopril Other (See Comments)    cough   Pravastatin     Aches    Family History: Family History  Problem Relation Age of Onset   Heart disease Father 74       MI   Kidney disease Father    Depression Maternal Grandmother    Alcohol abuse Neg Hx    Stroke Neg Hx    Prostate cancer Neg Hx    Colon cancer Neg Hx     Social History:  reports that he quit smoking about 6 years ago. His smoking use included cigarettes. He has a 30.00 pack-year smoking history. He has never used smokeless tobacco. He reports current alcohol use of about 1.0 standard drink of alcohol per week. He reports that he does not use drugs.   Physical Exam: BP 112/74   Pulse 78   Ht '6\' 3"'$  (1.905 m)   Wt 214 lb (97.1 kg)   BMI 26.75 kg/m   Constitutional:  Alert and oriented, No acute distress. HEENT: [place] AT, moist mucus membranes.  Trachea midline, no masses. Cardiovascular: No clubbing, cyanosis, or edema. Respiratory: Normal respiratory effort, no increased work of breathing.   Pertinent Imaging: KUB performed today was personally reviewed and interpreted.  There are 2 calcifications medial to the right renal outline which I thought may have potentially been ureteral calculi.  He did have a chest CT 12/01/2021.  The entire right kidney is seen.  There was a small upper pole calcification much smaller than what I am seeing on KUB and there was no hydronephrosis.   Assessment & Plan:    1.  Nephrolithiasis Recent CT with 2 ~ 2 mm right renal calculi Continue indapamide Repeat  24-hour urine study ordered Call earlier for development of flank pain.  He did request an Rx for 2 oxycodone in the event to potentially avoid an ED visit for renal colic and he would follow-up in the office the next day.  Rx sent Continue annual follow-up.  Will get every other year KUB if no symptoms or microhematuria  2.  Hypercalciuria As above Indapamide refilled  3.  BPH with LUTS Mild lower urinary tract symptoms    Abbie Sons, MD  [facility] [address] [place], [place] [address] [phone]

## 2021-12-17 DIAGNOSIS — N201 Calculus of ureter: Secondary | ICD-10-CM | POA: Diagnosis not present

## 2021-12-20 ENCOUNTER — Telehealth: Payer: Self-pay | Admitting: Urology

## 2021-12-20 ENCOUNTER — Ambulatory Visit: Payer: Medicare Other | Admitting: Pulmonary Disease

## 2021-12-20 ENCOUNTER — Other Ambulatory Visit: Payer: Self-pay

## 2021-12-20 ENCOUNTER — Encounter: Payer: Self-pay | Admitting: Pulmonary Disease

## 2021-12-20 VITALS — BP 120/70 | HR 61 | Temp 98.0°F | Ht 74.0 in | Wt 215.0 lb

## 2021-12-20 DIAGNOSIS — Z122 Encounter for screening for malignant neoplasm of respiratory organs: Secondary | ICD-10-CM

## 2021-12-20 DIAGNOSIS — Z87891 Personal history of nicotine dependence: Secondary | ICD-10-CM

## 2021-12-20 DIAGNOSIS — R911 Solitary pulmonary nodule: Secondary | ICD-10-CM | POA: Diagnosis not present

## 2021-12-20 MED ORDER — OXYCODONE-ACETAMINOPHEN 5-325 MG PO TABS: 1.0000 | ORAL_TABLET | Freq: Four times a day (QID) | ORAL | 0 refills | Status: DC | PRN

## 2021-12-20 NOTE — Patient Instructions (Signed)
Your lung CT looked better.  The small spot seen in your lung is definitely getting smaller it was probably a small focus of pneumonia.  We will see you in follow-up in a year's time.  Your lung cancer screening will be done in a year as well.  Call sooner should any new difficulties arise.

## 2021-12-20 NOTE — Telephone Encounter (Signed)
Pt does not have appt today, did stop by the office and is requesting to have Pain Medicattion.  Pharmacy is [facility] in [place].  Please call pt  [phone] (c).. [phone] (h)

## 2021-12-20 NOTE — Progress Notes (Signed)
Subjective:    Patient ID: Stephen Hanna, male    DOB: Feb 07, 1956, 66 y.o.   MRN: [id number] Patient Care Team: Tonia Ghent, MD as PCP - General (Family Medicine) Alda Berthold, DO as Consulting Physician (Neurology) Tonia Ghent, MD (Family Medicine)  Chief Complaint  Patient presents with   Follow-up   HPI Patient is a 66 year old former smoker (35 PY) who presents for follow-up of an incidental lung nodule noted on low-dose cancer screening.  This is a follow-up visit.  The initial finding was on 17 August 2021 showing a 9.7 mm "aggressive looking" nodule subpleural on the right upper lobe.  This was followed by a PET/CT on 07 Sep 2021 which showed diminution of this nodule to 5 mm and no significant SUV activity.  The patient at that time had been treated with antibiotics for a different issue.  Patient was initially evaluated here on 19 Sep 2021.  At that time the recommendation was to do a 55-monthfollow-up CT.  This was obtained on July 28.  The nodule in question is now 3 mm in size indicating this is likely related to inflammatory/infectious process which is resolving.  The films were shown in sequence to the patient.  Patient remains asymptomatic in this regard.  He has had no fevers, chills or sweats.  No cough or sputum production.  No hemoptysis.  Does not endorse any dyspnea, orthopnea or paroxysmal nocturnal dyspnea.  No lower extremity edema and no calf tenderness.  Overall he feels well and looks well.   Review of Systems A 10 point review of systems was performed and it is as noted above otherwise negative.  Patient Active Problem List   Diagnosis Date Noted   Cough 09/13/2021   Thyroid nodule 09/13/2021   Lung nodule 09/13/2021   Chest wall pain 07/19/2021   Healthcare maintenance 04/06/2020   Bladder calculi 02/18/2020   Diaphragmatic hernia 08/12/2019   BPH (benign prostatic hyperplasia) 08/12/2019   Facial paresthesia 03/05/2019   Coronary artery  calcification 03/26/2018   Wheeze 10/30/2016   Personal history of tobacco use, presenting hazards to health 02/24/2016   Pain in joint, ankle and foot 02/10/2016   Former smoker 02/10/2016   History of hypertension 05/16/2014   Advance care planning 01/28/2014   Pain in joint, shoulder region 01/28/2014   Pain in right knee 03/09/2013   Skin thickening 01/26/2013   Fatigue 01/04/2011   IRRITABLE BOWEL SYNDROME 06/22/2010   Recurrent nephrolithiasis 11/23/2008   BENIGN PROSTATIC HYPERTROPHY, MILD, HX OF 10/14/2008   HLD (hyperlipidemia) 10/06/2008   COLONIC POLYPS 10/08/2007   DIVERTICULOSIS OF COLON 10/08/2007   Hyperglycemia 10/08/2007   BACK PAIN, LUMBAR 01/01/2007   Social History   Tobacco Use   Smoking status: Former    Packs/day: 1.00    Years: 30.00    Total pack years: 30.00    Types: Cigarettes    Quit date: 01/28/2015    Years since quitting: 6.8   Smokeless tobacco: Never   Tobacco comments:    quit 6 years ago  Substance Use Topics   Alcohol use: Yes    Alcohol/week: 1.0 standard drink of alcohol    Types: 1 Standard drinks or equivalent per week    Comment: 1 drink a month   Allergies  Allergen Reactions   Atorvastatin     Aches at '30mg'$  but not '20mg'$  a day.    Chantix [Varenicline]     Abnormal dreams   Hydrocodone-Acetaminophen  Other (See Comments)    edgy, intolerant   Lisinopril Other (See Comments)    cough   Pravastatin     Aches   Current Meds  Medication Sig   albuterol (VENTOLIN HFA) 108 (90 Base) MCG/ACT inhaler Inhale 1-2 puffs into the lungs every 6 (six) hours as needed for wheezing (or for cough. okay to fill with albuterol/ventolin/proair).   indapamide (LOZOL) 2.5 MG tablet TAKE 1 TABLET BY MOUTH EVERY DAY   naproxen (NAPROSYN) 250 MG tablet Take 250 mg by mouth daily.   oxymetazoline (NASAL SPRAY MOISTURIZING 12 HR) 0.05 % nasal spray Place 1 spray into both nostrils 2 (two) times daily as needed for congestion. D& J nasal spray    psyllium (METAMUCIL) 58.6 % packet Take 1 packet by mouth daily.   vitamin B-12 (CYANOCOBALAMIN) 1000 MCG tablet Take 1,000 mcg by mouth daily.   Immunization History  Administered Date(s) Administered   Fluad Quad(high Dose 65+) 03/02/2019, 04/05/2020   Influenza Split 04/11/2011   Influenza Whole 04/17/2007, 02/10/2008   Influenza,inj,Quad PF,6+ Mos 01/26/2013, 01/28/2014, 02/01/2015, 02/11/2017, 02/17/2018   Moderna Sars-Covid-2 Vaccination 11/06/2019, 12/12/2019   Pneumococcal Polysaccharide-23 03/02/2019   Td 05/07/2004   Tdap 02/01/2015   Zoster, Live 01/28/2014       Objective:   Physical Exam BP 120/70 (BP Location: Left Arm, Patient Position: Sitting, Cuff Size: Large)   Pulse 61   Temp 98 F (36.7 C) (Oral)   Ht '6\' 2"'$  (1.88 m)   Wt 215 lb (97.5 kg)   SpO2 99%   BMI 27.60 kg/m  GENERAL: Well-developed, well-nourished gentleman, fit appearing, fully ambulatory, no acute distress.  No conversational dyspnea. HEAD: Normocephalic, atraumatic.  EYES: Pupils equal, round, reactive to light.  No scleral icterus.  MOUTH: Oral mucosa moist.  No thrush.  Dentition intact. NECK: Supple. No thyromegaly. Trachea midline. No JVD.  No adenopathy. PULMONARY: Good air entry bilaterally.  No adventitious sounds. CARDIOVASCULAR: S1 and S2. Regular rate and rhythm.  No rubs, murmurs or gallops heard. ABDOMEN: Benign. MUSCULOSKELETAL: No joint deformity, no clubbing, no edema.  NEUROLOGIC: No overt focal deficit, no gait disturbance, speech is fluent. SKIN: Intact,warm,dry. PSYCH: Mood and behavior normal.  Representative image of the CT performed 17 August 2021 showing the nodule in the right upper lobe at 9.7 mm:   Representative image from the CT performed 01 December 2021 showing regression of set nodule to 3 mm:   Nodule likely represents inflammatory change.    Assessment & Plan:     ICD-10-CM   1. Lung nodule seen on imaging study  R91.1    Nodule continues to  regress Likely inflammatory change Patient may return to yearly LDCT lung cancer screening    2. Former cigarette smoker  Z87.891    Come continues to be abstinent of tobacco No evidence of relapse     Patient's nodule continues to regress.  This represents inflammatory change/sequela from prior infection.  Patient will return to yearly low-dose CT lung cancer screening.  We will see the patient in follow-up in a year's time or as needed.  Renold Don, MD Advanced Bronchoscopy PCCM [facility]-    *This note was dictated using voice recognition software/Dragon.  Despite best efforts to proofread, errors can occur which can change the meaning. Any transcriptional errors that result from this process are unintentional and may not be fully corrected at the time of dictation.

## 2022-01-11 ENCOUNTER — Telehealth: Payer: Self-pay | Admitting: Urology

## 2022-01-11 NOTE — Telephone Encounter (Addendum)
Patient Stephen Hanna wanting to get his calcium urine results done on 12/14/21

## 2022-01-12 ENCOUNTER — Encounter: Payer: Self-pay | Admitting: Urology

## 2022-01-26 ENCOUNTER — Telehealth: Payer: Self-pay | Admitting: Urology

## 2022-01-26 ENCOUNTER — Encounter: Payer: Self-pay | Admitting: Urology

## 2022-01-26 NOTE — Telephone Encounter (Signed)
Pt called wanting to know his results of Urine test.

## 2022-01-26 NOTE — Telephone Encounter (Signed)
Notified via [place].  Please make sure he got the message

## 2022-03-29 ENCOUNTER — Other Ambulatory Visit: Payer: Self-pay | Admitting: Urology

## 2022-05-23 ENCOUNTER — Telehealth: Payer: Self-pay | Admitting: Family Medicine

## 2022-05-23 NOTE — Telephone Encounter (Signed)
LVM for pt to rtn my call to schedule AWV with [facility] call back # [phone] 

## 2022-05-30 ENCOUNTER — Ambulatory Visit (INDEPENDENT_AMBULATORY_CARE_PROVIDER_SITE_OTHER): Payer: Medicare HMO | Admitting: Family Medicine

## 2022-05-30 ENCOUNTER — Encounter: Payer: Self-pay | Admitting: Family Medicine

## 2022-05-30 VITALS — BP 124/78 | HR 91 | Temp 98.0°F | Ht 74.0 in | Wt 220.1 lb

## 2022-05-30 DIAGNOSIS — J069 Acute upper respiratory infection, unspecified: Secondary | ICD-10-CM | POA: Diagnosis not present

## 2022-05-30 MED ORDER — BENZONATATE 200 MG PO CAPS: 200.0000 mg | ORAL_CAPSULE | Freq: Three times a day (TID) | ORAL | 1 refills | Status: DC | PRN

## 2022-05-30 MED ORDER — DOXYCYCLINE HYCLATE 100 MG PO TABS: 100.0000 mg | ORAL_TABLET | Freq: Two times a day (BID) | ORAL | 0 refills | Status: DC

## 2022-05-30 NOTE — Progress Notes (Signed)
Subjective:    Patient ID: Stephen Hanna, male    DOB: 09/14/1955, 67 y.o.   MRN: [id number]  HPI 67 yo pt of Dr Damita Dunnings presents with uri symptoms for 2 weeks  Wt Readings from Last 3 Encounters:  05/30/22 220 lb 2 oz (99.8 kg)  12/20/21 215 lb (97.5 kg)  12/14/21 214 lb (97.1 kg)   28.26 kg/m  Started symptoms 2 weeks ago  Cold symptoms   Sore throat  Nasal and chest symptoms   Got better for about a day   Then came back Monday   Congestion  Runny nose  Green mucous  No sinus pain or pressure  Ears were stopped up - better now  Throat is a little better   Gargling with salt water Did use chlorasepitc spray   Cough Green phlegm  No wheezing  No sob  No chest soreness  No fever  Did not do a covdi   Worse am and middle of the night   Used to smoke  No copd or asthma   Otc  Throat spray  Some otc multi symptom  Eating oranges   Patient Active Problem List   Diagnosis Date Noted   URI with cough and congestion 05/30/2022   Cough 09/13/2021   Thyroid nodule 09/13/2021   Lung nodule 09/13/2021   Chest wall pain 07/19/2021   Healthcare maintenance 04/06/2020   Bladder calculi 02/18/2020   Diaphragmatic hernia 08/12/2019   BPH (benign prostatic hyperplasia) 08/12/2019   Facial paresthesia 03/05/2019   Coronary artery calcification 03/26/2018   Wheeze 10/30/2016   Personal history of tobacco use, presenting hazards to health 02/24/2016   Pain in joint, ankle and foot 02/10/2016   Former smoker 02/10/2016   History of hypertension 05/16/2014   Advance care planning 01/28/2014   Pain in joint, shoulder region 01/28/2014   Pain in right knee 03/09/2013   Skin thickening 01/26/2013   Fatigue 01/04/2011   IRRITABLE BOWEL SYNDROME 06/22/2010   Recurrent nephrolithiasis 11/23/2008   BENIGN PROSTATIC HYPERTROPHY, MILD, HX OF 10/14/2008   HLD (hyperlipidemia) 10/06/2008   COLONIC POLYPS 10/08/2007   DIVERTICULOSIS OF COLON 10/08/2007    Hyperglycemia 10/08/2007   BACK PAIN, LUMBAR 01/01/2007   Past Medical History:  Diagnosis Date   Arthritis    LEFT SHOULDER   Back pain, chronic    after fall from ladder in 1980s with episodic flares   BPH (benign prostatic hyperplasia)    Diverticulitis 10/2010   divertics one small polyp (Dr. Fuller Plan)   GERD (gastroesophageal reflux disease)    RARE   History of kidney stones    Hyperglycemia    Hyperlipidemia    Hypertension    H/O   IBS (irritable bowel syndrome)    diarrhea predominant   Personal history of kidney stones 02/07/2005   CT ABD/pelvis --Diverticulitis sig left renal stone // CT ABD w/o negative stones + multiple aortic L.N. 07/24/2004//   Past Surgical History:  Procedure Laterality Date   CHOLECYSTECTOMY     CYSTOSCOPY W/ RETROGRADES Right 03/01/2020   Procedure: CYSTOSCOPY WITH RETROGRADE PYELOGRAM;  Surgeon: Abbie Sons, MD;  Location: [facility];  Service: Urology;  Laterality: Right;   CYSTOSCOPY WITH LITHOLAPAXY N/A 03/01/2020   Procedure: CYSTOSCOPY WITH LITHOLAPAXY;  Surgeon: Abbie Sons, MD;  Location: [facility];  Service: Urology;  Laterality: N/A;   CYSTOSCOPY/URETEROSCOPY/HOLMIUM LASER/STENT PLACEMENT Right 03/01/2020   Procedure: CYSTOSCOPY/URETEROSCOPY/HOLMIUM LASER/STENT PLACEMENT;  Surgeon: Abbie Sons, MD;  Location: [facility];  Service: Urology;  Laterality: Right;   LAPAROSCOPIC CHOLECYSTECTOMY W/ CHOLANGIOGRAPHY  12/27/2008   Ct abd/pelvis w/o no gallstones mild dilation entire right ureter lap choley (Dr. Bary Castilla)   [place] PARAESOPHAGEAL HERNIA REPAIR N/A 09/03/2019   Procedure: XI ROBOTIC ASSISTED Diaphragmatic hernia REPAIR;  Surgeon: Jules Husbands, MD;  Location: [facility];  Service: General;  Laterality: N/A;   XI ROBOTIC ASSISTED VENTRAL HERNIA N/A 02/04/2020   Procedure: XI ROBOTIC ASSISTED VENTRAL HERNIA;  Surgeon: Jules Husbands, MD;  Location: [facility];  Service: General;  Laterality: N/A;    Social History   Tobacco Use   Smoking status: Former    Packs/day: 1.00    Years: 30.00    Total pack years: 30.00    Types: Cigarettes    Quit date: 01/28/2015    Years since quitting: 7.3   Smokeless tobacco: Never   Tobacco comments:    quit 6 years ago  Vaping Use   Vaping Use: Never used  Substance Use Topics   Alcohol use: Yes    Alcohol/week: 1.0 standard drink of alcohol    Types: 1 Standard drinks or equivalent per week    Comment: 1 drink a month   Drug use: No   Family History  Problem Relation Age of Onset   Heart disease Father 45       MI   Kidney disease Father    Depression Maternal Grandmother    Alcohol abuse Neg Hx    Stroke Neg Hx    Prostate cancer Neg Hx    Colon cancer Neg Hx    Allergies  Allergen Reactions   Atorvastatin     Aches at '30mg'$  but not '20mg'$  a day.    Chantix [Varenicline]     Abnormal dreams   Hydrocodone-Acetaminophen Other (See Comments)    edgy, intolerant   Lisinopril Other (See Comments)    cough   Pravastatin     Aches   Current Outpatient Medications on File Prior to Visit  Medication Sig Dispense Refill   albuterol (VENTOLIN HFA) 108 (90 Base) MCG/ACT inhaler Inhale 1-2 puffs into the lungs every 6 (six) hours as needed for wheezing (or for cough. okay to fill with albuterol/ventolin/proair). 8 g 1   indapamide (LOZOL) 2.5 MG tablet TAKE 1 TABLET BY MOUTH EVERY DAY 90 tablet 3   naproxen (NAPROSYN) 250 MG tablet Take 250 mg by mouth daily.     oxymetazoline (NASAL SPRAY MOISTURIZING 12 HR) 0.05 % nasal spray Place 1 spray into both nostrils 2 (two) times daily as needed for congestion. D& J nasal spray     psyllium (METAMUCIL) 58.6 % packet Take 1 packet by mouth daily.     vitamin B-12 (CYANOCOBALAMIN) 1000 MCG tablet Take 1,000 mcg by mouth daily.     oxyCODONE-acetaminophen (PERCOCET/ROXICET) 5-325 MG tablet Take 1 tablet by mouth every 6 (six) hours as needed for severe pain. (Patient not taking: Reported on  05/30/2022) 2 tablet 0   No current facility-administered medications on file prior to visit.       Review of Systems  Constitutional:  Positive for appetite change and fatigue. Negative for fever.  HENT:  Positive for congestion, postnasal drip, rhinorrhea, sinus pressure, sneezing and sore throat. Negative for ear pain.   Eyes:  Negative for pain and discharge.  Respiratory:  Positive for cough. Negative for shortness of breath, wheezing and stridor.   Cardiovascular:  Negative for chest pain.  Gastrointestinal:  Negative for diarrhea, nausea and vomiting.  Genitourinary:  Negative for frequency, hematuria and urgency.  Musculoskeletal:  Negative for arthralgias and myalgias.  Skin:  Negative for rash.  Neurological:  Positive for headaches. Negative for dizziness, weakness and light-headedness.  Psychiatric/Behavioral:  Negative for confusion and dysphoric mood.        Objective:   Physical Exam Constitutional:      General: He is not in acute distress.    Appearance: Normal appearance. He is well-developed. He is not ill-appearing, toxic-appearing or diaphoretic.     Comments: overwt  HENT:     Head: Normocephalic and atraumatic.     Comments: Nares are injected and congested      Right Ear: Tympanic membrane, ear canal and external ear normal.     Left Ear: Tympanic membrane, ear canal and external ear normal.     Nose: Congestion and rhinorrhea present.     Mouth/Throat:     Mouth: Mucous membranes are moist.     Pharynx: Oropharynx is clear. No oropharyngeal exudate or posterior oropharyngeal erythema.     Comments: Clear pnd  Eyes:     General:        Right eye: No discharge.        Left eye: No discharge.     Conjunctiva/sclera: Conjunctivae normal.     Pupils: Pupils are equal, round, and reactive to light.  Cardiovascular:     Rate and Rhythm: Normal rate.     Heart sounds: Normal heart sounds.  Pulmonary:     Effort: Pulmonary effort is normal. No  respiratory distress.     Breath sounds: Normal breath sounds. No stridor. No wheezing, rhonchi or rales.     Comments: Good air exch  Chest:     Chest wall: No tenderness.  Musculoskeletal:     Cervical back: Normal range of motion and neck supple.  Lymphadenopathy:     Cervical: No cervical adenopathy.  Skin:    General: Skin is warm and dry.     Capillary Refill: Capillary refill takes less than 2 seconds.     Findings: No rash.  Neurological:     Mental Status: He is alert.     Cranial Nerves: No cranial nerve deficit.  Psychiatric:        Mood and Affect: Mood normal.     Comments: Seems irritable/frustrated today           Assessment & Plan:   Problem List Items Addressed This Visit       Respiratory   URI with cough and congestion - Primary    2 weeks./no improvement with congestion and prod cough with green phlegm Reassuring exam  Given timeline doxycycline px to cover bact infx ER precautions noted Disc sympt care  Tessalon sent for cough  Otc exp with dm may help Update if not starting to improve in a week or if worsening

## 2022-05-30 NOTE — Patient Instructions (Signed)
I recommend an expectorant with cough suppressant like mucinex DM or robitussin dm   I can send in some tessalon also for cough   Take doxycycline as directed   Watch for wheezing  Watch for fever Watch for shortness of breath

## 2022-05-30 NOTE — Assessment & Plan Note (Signed)
2 weeks./no improvement with congestion and prod cough with green phlegm Reassuring exam  Given timeline doxycycline px to cover bact infx ER precautions noted Disc sympt care  Tessalon sent for cough  Otc exp with dm may help Update if not starting to improve in a week or if worsening

## 2022-06-20 ENCOUNTER — Ambulatory Visit (INDEPENDENT_AMBULATORY_CARE_PROVIDER_SITE_OTHER): Payer: Medicare HMO

## 2022-06-20 VITALS — Ht 75.0 in | Wt 215.0 lb

## 2022-06-20 DIAGNOSIS — Z0000 Encounter for general adult medical examination without abnormal findings: Secondary | ICD-10-CM

## 2022-06-20 NOTE — Patient Instructions (Addendum)
[name] , Thank you for taking time to come for your Medicare Wellness Visit. I appreciate your ongoing commitment to your health goals. Please review the following plan we discussed and let me know if I can assist you in the future.   These are the goals we discussed:  Goals      Patient Stated     08/25/2019, I will maintain and continue medications as prescribed.      Patient Stated     Would like maintain current health.        This is a list of the screening recommended for you and due dates:  Health Maintenance  Topic Date Due   Zoster (Shingles) Vaccine (1 of 2) Never done   Flu Shot  12/05/2021   COVID-19 Vaccine (3 - 2023-24 season) 01/05/2022   Pneumonia Vaccine (2 of 2 - PCV) 09/20/2022*   Screening for Lung Cancer  12/02/2022   Medicare Annual Wellness Visit  06/21/2023   DTaP/Tdap/Td vaccine (3 - Td or Tdap) 01/31/2025   Colon Cancer Screening  03/28/2025   Hepatitis C Screening: USPSTF Recommendation to screen - Ages 18-79 yo.  Completed   HPV Vaccine  Aged Out  *Topic was postponed. The date shown is not the original due date.    Advanced directives: yes  Conditions/risks identified: none  Next appointment: Follow up in one year for your annual wellness visit. 06/25/2023 @8$ :30am/telephone  Preventive Care 65 Years and Older, Male  Preventive care refers to lifestyle choices and visits with your health care provider that can promote health and wellness. What does preventive care include? A yearly physical exam. This is also called an annual well check. Dental exams once or twice a year. Routine eye exams. Ask your health care provider how often you should have your eyes checked. Personal lifestyle choices, including: Daily care of your teeth and gums. Regular physical activity. Eating a healthy diet. Avoiding tobacco and drug use. Limiting alcohol use. Practicing safe sex. Taking low doses of aspirin every day. Taking vitamin and mineral  supplements as recommended by your health care provider. What happens during an annual well check? The services and screenings done by your health care provider during your annual well check will depend on your age, overall health, lifestyle risk factors, and family history of disease. Counseling  Your health care provider may ask you questions about your: Alcohol use. Tobacco use. Drug use. Emotional well-being. Home and relationship well-being. Sexual activity. Eating habits. History of falls. Memory and ability to understand (cognition). Work and work Statistician. Screening  You may have the following tests or measurements: Height, weight, and BMI. Blood pressure. Lipid and cholesterol levels. These may be checked every 5 years, or more frequently if you are over 33 years old. Skin check. Lung cancer screening. You may have this screening every year starting at age 51 if you have a 30-pack-year history of smoking and currently smoke or have quit within the past 15 years. Fecal occult blood test (FOBT) of the stool. You may have this test every year starting at age 56. Flexible sigmoidoscopy or colonoscopy. You may have a sigmoidoscopy every 5 years or a colonoscopy every 10 years starting at age 26. Prostate cancer screening. Recommendations will vary depending on your family history and other risks. Hepatitis C blood test. Hepatitis B blood test. Sexually transmitted disease (STD) testing. Diabetes screening. This is done by checking your blood sugar (glucose) after you have not eaten for a while (fasting). You  may have this done every 1-3 years. Abdominal aortic aneurysm (AAA) screening. You may need this if you are a current or former smoker. Osteoporosis. You may be screened starting at age 9 if you are at high risk. Talk with your health care provider about your test results, treatment options, and if necessary, the need for more tests. Vaccines  Your health care provider  may recommend certain vaccines, such as: Influenza vaccine. This is recommended every year. Tetanus, diphtheria, and acellular pertussis (Tdap, Td) vaccine. You may need a Td booster every 10 years. Zoster vaccine. You may need this after age 60. Pneumococcal 13-valent conjugate (PCV13) vaccine. One dose is recommended after age 8. Pneumococcal polysaccharide (PPSV23) vaccine. One dose is recommended after age 72. Talk to your health care provider about which screenings and vaccines you need and how often you need them. This information is not intended to replace advice given to you by your health care provider. Make sure you discuss any questions you have with your health care provider. Document Released: 05/20/2015 Document Revised: 01/11/2016 Document Reviewed: 02/22/2015 Elsevier Interactive Patient Education  2017 [place] Prevention in the Home Falls can cause injuries. They can happen to people of all ages. There are many things you can do to make your home safe and to help prevent falls. What can I do on the outside of my home? Regularly fix the edges of walkways and driveways and fix any cracks. Remove anything that might make you trip as you walk through a door, such as a raised step or threshold. Trim any bushes or trees on the path to your home. Use bright outdoor lighting. Clear any walking paths of anything that might make someone trip, such as rocks or tools. Regularly check to see if handrails are loose or broken. Make sure that both sides of any steps have handrails. Any raised decks and porches should have guardrails on the edges. Have any leaves, snow, or ice cleared regularly. Use sand or salt on walking paths during winter. Clean up any spills in your garage right away. This includes oil or grease spills. What can I do in the bathroom? Use night lights. Install grab bars by the toilet and in the tub and shower. Do not use towel bars as grab bars. Use  non-skid mats or decals in the tub or shower. If you need to sit down in the shower, use a plastic, non-slip stool. Keep the floor dry. Clean up any water that spills on the floor as soon as it happens. Remove soap buildup in the tub or shower regularly. Attach bath mats securely with double-sided non-slip rug tape. Do not have throw rugs and other things on the floor that can make you trip. What can I do in the bedroom? Use night lights. Make sure that you have a light by your bed that is easy to reach. Do not use any sheets or blankets that are too big for your bed. They should not hang down onto the floor. Have a firm chair that has side arms. You can use this for support while you get dressed. Do not have throw rugs and other things on the floor that can make you trip. What can I do in the kitchen? Clean up any spills right away. Avoid walking on wet floors. Keep items that you use a lot in easy-to-reach places. If you need to reach something above you, use a strong step stool that has a grab bar. Keep electrical  cords out of the way. Do not use floor polish or wax that makes floors slippery. If you must use wax, use non-skid floor wax. Do not have throw rugs and other things on the floor that can make you trip. What can I do with my stairs? Do not leave any items on the stairs. Make sure that there are handrails on both sides of the stairs and use them. Fix handrails that are broken or loose. Make sure that handrails are as long as the stairways. Check any carpeting to make sure that it is firmly attached to the stairs. Fix any carpet that is loose or worn. Avoid having throw rugs at the top or bottom of the stairs. If you do have throw rugs, attach them to the floor with carpet tape. Make sure that you have a light switch at the top of the stairs and the bottom of the stairs. If you do not have them, ask someone to add them for you. What else can I do to help prevent falls? Wear  shoes that: Do not have high heels. Have rubber bottoms. Are comfortable and fit you well. Are closed at the toe. Do not wear sandals. If you use a stepladder: Make sure that it is fully opened. Do not climb a closed stepladder. Make sure that both sides of the stepladder are locked into place. Ask someone to hold it for you, if possible. Clearly mark and make sure that you can see: Any grab bars or handrails. First and last steps. Where the edge of each step is. Use tools that help you move around (mobility aids) if they are needed. These include: Canes. Walkers. Scooters. Crutches. Turn on the lights when you go into a dark area. Replace any light bulbs as soon as they burn out. Set up your furniture so you have a clear path. Avoid moving your furniture around. If any of your floors are uneven, fix them. If there are any pets around you, be aware of where they are. Review your medicines with your doctor. Some medicines can make you feel dizzy. This can increase your chance of falling. Ask your doctor what other things that you can do to help prevent falls. This information is not intended to replace advice given to you by your health care provider. Make sure you discuss any questions you have with your health care provider. Document Released: 02/17/2009 Document Revised: 09/29/2015 Document Reviewed: 05/28/2014 Elsevier Interactive Patient Education  2017 [employer].

## 2022-06-20 NOTE — Progress Notes (Signed)
I connected with  Stephen Hanna on 06/20/22 by a audio enabled telemedicine application and verified that I am speaking with the correct person using two identifiers.  Patient Location: Home  Provider Location: Office/Clinic  I discussed the limitations of evaluation and management by telemedicine. The patient expressed understanding and agreed to proceed.  Subjective:   Stephen Hanna is a 67 y.o. male who presents for Medicare Annual/Subsequent preventive examination.  Review of Systems     Cardiac Risk Factors include: advanced age (>76mn, >>55women);dyslipidemia;male gender     Objective:    Today's Vitals   06/20/22 0847  Weight: 215 lb (97.5 kg)  Height: 6' 3"$  (1.905 m)   Body mass index is 26.87 kg/m.     06/20/2022    9:01 AM 04/03/2021    1:17 PM 01/28/2020    9:41 AM 09/03/2019    6:50 AM 08/28/2019   12:16 PM 08/25/2019    9:02 AM 08/08/2019    2:30 PM  Advanced Directives  Does Patient Have a Medical Advance Directive? Yes Yes No Yes Yes Yes No  Type of Advance Directive Living will;Healthcare Power of [place]  Living will;Healthcare Power of [place]   Does patient want to make changes to medical advance directive?  Yes (MAU/Ambulatory/Procedural Areas - Information given)  No - Patient declined     Copy of [place] Chart? No - copy requested   No - copy requested  No - copy requested   Would patient like information on creating a medical advance directive?   No - Patient declined    No - Patient declined    Current Medications (verified) Outpatient Encounter Medications as of 06/20/2022  Medication Sig   indapamide (LOZOL) 2.5 MG tablet TAKE 1 TABLET BY MOUTH EVERY DAY   naproxen (NAPROSYN) 250 MG tablet Take 250 mg by mouth daily.   oxymetazoline (NASAL SPRAY MOISTURIZING 12 HR) 0.05 % nasal spray Place 1 spray into both nostrils 2 (two) times daily  as needed for congestion. D& J nasal spray   psyllium (METAMUCIL) 58.6 % packet Take 1 packet by mouth daily.   vitamin B-12 (CYANOCOBALAMIN) 1000 MCG tablet Take 1,000 mcg by mouth daily.   albuterol (VENTOLIN HFA) 108 (90 Base) MCG/ACT inhaler Inhale 1-2 puffs into the lungs every 6 (six) hours as needed for wheezing (or for cough. okay to fill with albuterol/ventolin/proair). (Patient not taking: Reported on 06/20/2022)   benzonatate (TESSALON) 200 MG capsule Take 1 capsule (200 mg total) by mouth 3 (three) times daily as needed. Swallow whole (Patient not taking: Reported on 06/20/2022)   doxycycline (VIBRA-TABS) 100 MG tablet Take 1 tablet (100 mg total) by mouth 2 (two) times daily. (Patient not taking: Reported on 06/20/2022)   oxyCODONE-acetaminophen (PERCOCET/ROXICET) 5-325 MG tablet Take 1 tablet by mouth every 6 (six) hours as needed for severe pain. (Patient not taking: Reported on 05/30/2022)   No facility-administered encounter medications on file as of 06/20/2022.    Allergies (verified) Atorvastatin, Chantix [varenicline], Hydrocodone-acetaminophen, Lisinopril, and Pravastatin   History: Past Medical History:  Diagnosis Date   Arthritis    LEFT SHOULDER   Back pain, chronic    after fall from ladder in 1980s with episodic flares   BPH (benign prostatic hyperplasia)    Diverticulitis 10/2010   divertics one small polyp (Dr. SFuller Plan   GERD (gastroesophageal reflux disease)    RARE   History  of kidney stones    Hyperglycemia    Hyperlipidemia    Hypertension    H/O   IBS (irritable bowel syndrome)    diarrhea predominant   Personal history of kidney stones 02/07/2005   CT ABD/pelvis --Diverticulitis sig left renal stone // CT ABD w/o negative stones + multiple aortic L.N. 07/24/2004//   Past Surgical History:  Procedure Laterality Date   CHOLECYSTECTOMY     CYSTOSCOPY W/ RETROGRADES Right 03/01/2020   Procedure: CYSTOSCOPY WITH RETROGRADE PYELOGRAM;  Surgeon: Abbie Sons, MD;  Location: [facility];  Service: Urology;  Laterality: Right;   CYSTOSCOPY WITH LITHOLAPAXY N/A 03/01/2020   Procedure: CYSTOSCOPY WITH LITHOLAPAXY;  Surgeon: Abbie Sons, MD;  Location: [facility];  Service: Urology;  Laterality: N/A;   CYSTOSCOPY/URETEROSCOPY/HOLMIUM LASER/STENT PLACEMENT Right 03/01/2020   Procedure: CYSTOSCOPY/URETEROSCOPY/HOLMIUM LASER/STENT PLACEMENT;  Surgeon: Abbie Sons, MD;  Location: [facility];  Service: Urology;  Laterality: Right;   LAPAROSCOPIC CHOLECYSTECTOMY W/ CHOLANGIOGRAPHY  12/27/2008   Ct abd/pelvis w/o no gallstones mild dilation entire right ureter lap choley (Dr. Bary Castilla)   [place] PARAESOPHAGEAL HERNIA REPAIR N/A 09/03/2019   Procedure: XI ROBOTIC ASSISTED Diaphragmatic hernia REPAIR;  Surgeon: Jules Husbands, MD;  Location: [facility];  Service: General;  Laterality: N/A;   XI ROBOTIC ASSISTED VENTRAL HERNIA N/A 02/04/2020   Procedure: XI ROBOTIC ASSISTED VENTRAL HERNIA;  Surgeon: Jules Husbands, MD;  Location: [facility];  Service: General;  Laterality: N/A;   Family History  Problem Relation Age of Onset   Heart disease Father 64       MI   Kidney disease Father    Depression Maternal Grandmother    Alcohol abuse Neg Hx    Stroke Neg Hx    Prostate cancer Neg Hx    Colon cancer Neg Hx    Social History   Socioeconomic History   Marital status: Married    Spouse name: Not on file   Number of children: Not on file   Years of education: Not on file   Highest education level: Not on file  Occupational History   Not on file  Tobacco Use   Smoking status: Former    Packs/day: 1.00    Years: 30.00    Total pack years: 30.00    Types: Cigarettes    Quit date: 01/28/2015    Years since quitting: 7.3   Smokeless tobacco: Never   Tobacco comments:    quit 6 years ago  Vaping Use   Vaping Use: Never used  Substance and Sexual Activity   Alcohol use: Yes    Alcohol/week: 1.0 standard drink of  alcohol    Types: 1 Standard drinks or equivalent per week    Comment: 1 drink a month   Drug use: No   Sexual activity: Yes  Other Topics Concern   Not on file  Social History Narrative   Retired from [employer], Dealer   Former Brewing technologist, 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   Left handed   Lives in a single story home with wife   Social Determinants of Health   Financial Resource Strain: [place]  (06/20/2022)   Overall Financial Resource Strain (CARDIA)    Difficulty of Paying Living Expenses: Not hard at all  Food Insecurity: No Food Insecurity (06/20/2022)   Hunger Vital Sign    Worried About Running Out of Food in the Last Year: Never true    Ran Out of  Food in the Last Year: Never true  Transportation Needs: No Transportation Needs (06/20/2022)   PRAPARE - Hydrologist (Medical): No    Lack of Transportation (Non-Medical): No  Physical Activity: Sufficiently Active (06/20/2022)   Exercise Vital Sign    Days of Exercise per Week: 3 days    Minutes of Exercise per Session: 60 min  Stress: No Stress Concern Present (06/20/2022)   [place]    Feeling of Stress : Not at all  Social Connections: [place] (04/03/2021)   Social Connection and Isolation Panel [NHANES]    Frequency of Communication with Friends and Family: More than three times a week    Frequency of Social Gatherings with Friends and Family: More than three times a week    Attends Religious Services: More than 4 times per year    Active Member of [employer] or Organizations: Yes    Attends Music therapist: More than 4 times per year    Marital Status: Married    Tobacco Counseling Counseling given: Not Answered Tobacco comments: quit 6 years ago   Clinical Intake:  Pre-visit preparation completed: Yes  Pain : No/denies pain     BMI - recorded: 26.87 Nutritional Status: BMI 25 -29  Overweight Nutritional Risks: None Diabetes: No  How often do you need to have someone help you when you read instructions, pamphlets, or other written materials from your doctor or pharmacy?: 1 - Never  Diabetic?no  Interpreter Needed?: No  Information entered by :: B.Teyon Odette,LPN   Activities of Daily Living    06/20/2022    9:01 AM  In your present state of health, do you have any difficulty performing the following activities:  Hearing? 0  Vision? 0  Difficulty concentrating or making decisions? 0  Walking or climbing stairs? 0  Dressing or bathing? 0  Doing errands, shopping? 0  Preparing Food and eating ? N  Using the Toilet? N  In the past six months, have you accidently leaked urine? N  Do you have problems with loss of bowel control? N  Managing your Medications? N  Managing your Finances? N  Housekeeping or managing your Housekeeping? N    Patient Care Team: Tonia Ghent, MD as PCP - General (Family Medicine) Alda Berthold, DO as Consulting Physician (Neurology) Tonia Ghent, MD (Family Medicine)  Indicate any recent Medical Services you may have received from other than [facility] providers in the past year (date may be approximate).     Assessment:   This is a routine wellness examination for Stephen Hanna.  Hearing/Vision screen Hearing Screening - Comments:: Adequate hearing Vision Screening - Comments:: Adequate vision. UTD w/Dr Ellin Mayhew  Dietary issues and exercise activities discussed: Current Exercise Habits: Home exercise routine, Type of exercise: walking, Time (Minutes): 60, Frequency (Times/Week): 4, Weekly Exercise (Minutes/Week): 240, Intensity: Mild, Exercise limited by: None identified   Goals Addressed             This Visit's Progress    Patient Stated   On track    08/25/2019, I will maintain and continue medications as prescribed.      Patient Stated   On track    Would like maintain current health.       Depression Screen     06/20/2022    8:52 AM 04/03/2021    1:20 PM 08/25/2019    9:03 AM 03/02/2019    8:54 AM 02/17/2018  9:00 AM 02/11/2017    8:45 AM  PHQ 2/9 Scores  PHQ - 2 Score 0 0 0 0 0 0  PHQ- 9 Score   0       Fall Risk    06/20/2022    8:59 AM 04/03/2021    1:19 PM 04/05/2020    9:36 AM 02/11/2020   10:49 AM 01/27/2020    9:06 AM  Fall Risk   Falls in the past year? 0 0 0 0 0  Number falls in past yr: 0 0 0    Injury with Fall? 0 0 0    Risk for fall due to : No Fall Risks No Fall Risks     Follow up  Falls prevention discussed       FALL RISK PREVENTION PERTAINING TO THE HOME:  Any stairs in or around the home? Yes  If so, are there any without handrails? Yes  Home free of loose throw rugs in walkways, pet beds, electrical cords, etc? Yes  Adequate lighting in your home to reduce risk of falls? Yes   ASSISTIVE DEVICES UTILIZED TO PREVENT FALLS:  Life alert? No  Use of a cane, walker or w/c? No  Grab bars in the bathroom? Yes  Shower chair or bench in shower? No  Elevated toilet seat or a handicapped toilet? No   Cognitive Function:    08/25/2019    9:04 AM  MMSE - Mini Mental State Exam  Orientation to time 5  Orientation to Place 5  Registration 3  Attention/ Calculation 0  Recall 3  Language- repeat 1        06/20/2022    8:55 AM  6CIT Screen  What Year? 0 points  What month? 0 points  What time? 0 points  Count back from 20 0 points  Months in reverse 0 points  Repeat phrase 2 points  Total Score 2 points    Immunizations Immunization History  Administered Date(s) Administered   Fluad Quad(high Dose 65+) 03/02/2019, 04/05/2020   Influenza Split 04/11/2011   Influenza Whole 04/17/2007, 02/10/2008   Influenza,inj,Quad PF,6+ Mos 01/26/2013, 01/28/2014, 02/01/2015, 02/11/2017, 02/17/2018   Moderna Sars-Covid-2 Vaccination 11/06/2019, 12/12/2019   Pneumococcal Polysaccharide-23 03/02/2019   Td 05/07/2004   Tdap 02/01/2015   Zoster, Live 01/28/2014     TDAP status: Up to date  Flu Vaccine status: Up to date  Pneumococcal vaccine status: Up to date  Covid-19 vaccine status: Completed vaccines  Qualifies for Shingles Vaccine? Yes   Zostavax completed Yes   Shingrix Completed?: Yes  Screening Tests Health Maintenance  Topic Date Due   Zoster Vaccines- Shingrix (1 of 2) Never done   INFLUENZA VACCINE  12/05/2021   COVID-19 Vaccine (3 - 2023-24 season) 01/05/2022   Pneumonia Vaccine 85+ Years old (2 of 2 - PCV) 09/20/2022 (Originally 03/01/2020)   Lung Cancer Screening  12/02/2022   Medicare Annual Wellness (AWV)  06/21/2023   DTaP/Tdap/Td (3 - Td or Tdap) 01/31/2025   COLONOSCOPY (Pts 45-75yr Insurance coverage will need to be confirmed)  03/28/2025   Hepatitis C Screening  Completed   HPV VACCINES  Aged Out    Health Maintenance  Health Maintenance Due  Topic Date Due   Zoster Vaccines- Shingrix (1 of 2) Never done   INFLUENZA VACCINE  12/05/2021   COVID-19 Vaccine (3 - 2023-24 season) 01/05/2022    Colorectal cancer screening: Type of screening: Colonoscopy. Completed yes. Repeat every 5 years  Lung Cancer Screening: (Low Dose CT  Chest recommended if Age 70-80 years, 30 pack-year currently smoking OR have quit w/in 15years.) does not qualify.   Lung Cancer Screening Referral: no completed in past  Additional Screening:  Hepatitis C Screening: does not qualify; Completed no  Vision Screening: Recommended annual ophthalmology exams for early detection of glaucoma and other disorders of the eye. Is the patient up to date with their annual eye exam?  Yes  Who is the provider or what is the name of the office in which the patient attends annual eye exams? Dr Graciella Belton If pt is not established with a provider, would they like to be referred to a provider to establish care? No .   Dental Screening: Recommended annual dental exams for proper oral hygiene  Community Resource Referral / Chronic Care  Management: CRR required this visit?  No   CCM required this visit?  No      Plan:     I have personally reviewed and noted the following in the patient's chart:   Medical and social history Use of alcohol, tobacco or illicit drugs  Current medications and supplements including opioid prescriptions. Patient is not currently taking opioid prescriptions. Functional ability and status Nutritional status Physical activity Advanced directives List of other physicians Hospitalizations, surgeries, and ER visits in previous 12 months Vitals Screenings to include cognitive, depression, and falls Referrals and appointments  In addition, I have reviewed and discussed with patient certain preventive protocols, quality metrics, and best practice recommendations. A written personalized care plan for preventive services as well as general preventive health recommendations were provided to patient.     Roger Shelter, LPN   [id number]   Nurse Notes: pt states he is doing well and has no needs, concerns or questions.

## 2022-07-07 DIAGNOSIS — R69 Illness, unspecified: Secondary | ICD-10-CM | POA: Diagnosis not present

## 2022-07-11 ENCOUNTER — Other Ambulatory Visit: Payer: Self-pay | Admitting: Family Medicine

## 2022-07-11 DIAGNOSIS — Z125 Encounter for screening for malignant neoplasm of prostate: Secondary | ICD-10-CM

## 2022-07-11 DIAGNOSIS — E785 Hyperlipidemia, unspecified: Secondary | ICD-10-CM

## 2022-07-17 ENCOUNTER — Other Ambulatory Visit (INDEPENDENT_AMBULATORY_CARE_PROVIDER_SITE_OTHER): Payer: Medicare HMO

## 2022-07-17 DIAGNOSIS — E785 Hyperlipidemia, unspecified: Secondary | ICD-10-CM

## 2022-07-17 DIAGNOSIS — Z125 Encounter for screening for malignant neoplasm of prostate: Secondary | ICD-10-CM | POA: Diagnosis not present

## 2022-07-17 LAB — COMPREHENSIVE METABOLIC PANEL
ALT: 21 U/L (ref 0–53)
AST: 17 U/L (ref 0–37)
Albumin: 3.9 g/dL (ref 3.5–5.2)
Alkaline Phosphatase: 53 U/L (ref 39–117)
BUN: 21 mg/dL (ref 6–23)
CO2: 30 mEq/L (ref 19–32)
Calcium: 9.5 mg/dL (ref 8.4–10.5)
Chloride: 100 mEq/L (ref 96–112)
Creatinine, Ser: 1.04 mg/dL (ref 0.40–1.50)
GFR: 73.59 mL/min (ref >60.00)
Glucose, Bld: 105 mg/dL — ABNORMAL HIGH (ref 70–99)
Potassium: 3.7 mEq/L (ref 3.5–5.1)
Sodium: 140 mEq/L (ref 135–145)
Total Bilirubin: 0.4 mg/dL (ref 0.2–1.2)
Total Protein: 6.1 g/dL (ref 6.0–8.3)

## 2022-07-17 LAB — LIPID PANEL
Cholesterol: 178 mg/dL (ref 0–200)
HDL: 38.7 mg/dL — ABNORMAL LOW (ref >39.00)
LDL Cholesterol: 109 mg/dL — ABNORMAL HIGH (ref 0–99)
NonHDL: 139.74
Total CHOL/HDL Ratio: 5
Triglycerides: 152 mg/dL — ABNORMAL HIGH (ref 0.0–149.0)
VLDL: 30.4 mg/dL (ref 0.0–40.0)

## 2022-07-17 LAB — PSA, MEDICARE: PSA: 2.36 ng/ml (ref 0.10–4.00)

## 2022-07-24 ENCOUNTER — Encounter: Payer: Medicare Other | Admitting: Family Medicine

## 2022-07-26 ENCOUNTER — Ambulatory Visit (INDEPENDENT_AMBULATORY_CARE_PROVIDER_SITE_OTHER): Payer: Medicare HMO | Admitting: Family Medicine

## 2022-07-26 ENCOUNTER — Encounter: Payer: Self-pay | Admitting: Family Medicine

## 2022-07-26 VITALS — BP 118/82 | HR 65 | Temp 97.7°F | Ht 75.0 in | Wt 220.0 lb

## 2022-07-26 DIAGNOSIS — E041 Nontoxic single thyroid nodule: Secondary | ICD-10-CM

## 2022-07-26 DIAGNOSIS — R911 Solitary pulmonary nodule: Secondary | ICD-10-CM | POA: Diagnosis not present

## 2022-07-26 DIAGNOSIS — Z0000 Encounter for general adult medical examination without abnormal findings: Secondary | ICD-10-CM

## 2022-07-26 DIAGNOSIS — Z7189 Other specified counseling: Secondary | ICD-10-CM

## 2022-07-26 DIAGNOSIS — I2584 Coronary atherosclerosis due to calcified coronary lesion: Secondary | ICD-10-CM | POA: Diagnosis not present

## 2022-07-26 DIAGNOSIS — I2510 Atherosclerotic heart disease of native coronary artery without angina pectoris: Secondary | ICD-10-CM | POA: Diagnosis not present

## 2022-07-26 DIAGNOSIS — N200 Calculus of kidney: Secondary | ICD-10-CM

## 2022-07-26 NOTE — Progress Notes (Signed)
CAD prev noted, prev on atorvastatin/pravastatin.  Statin intolerant.  Per cardiology note, had calcium on CT calcium score no significant obstructive disease with negative FFR in all 3 major vessels.  No CP.  Not SOB.  Has cardiology fu pending.  Labs d/w pt.    Prev lung imaging d/w pt. He'll see pulmonary this year.  I will defer.  He agrees.  1. Subpleural solid pulmonary nodule of the right upper lobe measures 3 mm, previously 5 mm on prior PET-CT. Given continued decrease in size, favor sequela of prior infection. Recommend return to annual lung cancer screening. 2. Aortic Atherosclerosis (ICD10-I70.0) and Emphysema (ICD10-J43.9).  Thyroid nodule.  Regarding his prev eval, 1-yr follow-up ultrasound is recommended for Bethesda 2 nodules to document stability.  If the thyroid nodule is unchanged he may not need anything else done.  If it has changed at 1 year he may need repeat biopsy or other evaluation/treatment. He needs f/u thyroid u/s in 10/2022.  No dysphagia. No neck mass.    Oxycodone used if needed for renal stones.  Per outside clinic.  Turmeric helped with aches. D/w pt.    Flu d/w pt. Shingles discussed with patient PNA 2020 Tetanus 2016 covid 2021 RSV vaccine d/w pt  Colonoscopy 2016 Prostate cancer screening 2024 Advance directive-wife designated if patient were incapacitated. AAA screening neg 2020 Prev lung cancer screening protocol d/w pt.  He can update me as needed.    Otherwise he feels well.  His granddaughter is playing travel softball.  Meds, vitals, and allergies reviewed.   ROS: Per HPI unless specifically indicated in ROS section   GEN: nad, alert and oriented HEENT: ncat NECK: supple w/o LA, no neck mass noted.  CV: rrr.  PULM: ctab, no inc wob ABD: soft, +bs EXT: no edema SKIN: no acute rash  30 minutes were devoted to patient care in this encounter (this includes time spent reviewing the patient's file/history, interviewing and examining the  patient, counseling/reviewing plan with patient).

## 2022-07-26 NOTE — Patient Instructions (Signed)
Take care.  Glad to see you. I'll work on the ultrasound in June.  You should get a call about that.

## 2022-07-27 NOTE — Assessment & Plan Note (Signed)
Oxycodone used if needed for renal stones.  Per outside clinic.

## 2022-07-27 NOTE — Assessment & Plan Note (Signed)
Statin intolerant.  I will defer to cardiology.  Labs discussed with patient.  No CP.  Not SOB.  Has cardiology fu pending.

## 2022-07-27 NOTE — Assessment & Plan Note (Signed)
Prev lung imaging d/w pt. He'll see pulmonary this year.  I will defer.  He agrees.  1. Subpleural solid pulmonary nodule of the right upper lobe measures 3 mm, previously 5 mm on prior PET-CT. Given continued decrease in size, favor sequela of prior infection. Recommend return to annual lung cancer screening. 2. Aortic Atherosclerosis (ICD10-I70.0) and Emphysema (ICD10-J43.9).

## 2022-07-27 NOTE — Assessment & Plan Note (Signed)
Advance directive- wife designated if patient were incapacitated.  

## 2022-07-27 NOTE — Assessment & Plan Note (Signed)
Flu d/w pt. Shingles discussed with patient PNA 2020 Tetanus 2016 covid 2021 RSV vaccine d/w pt  Colonoscopy 2016 Prostate cancer screening 2024 Advance directive-wife designated if patient were incapacitated. AAA screening neg 2020 Prev lung cancer screening protocol d/w pt.  He can update me as needed.

## 2022-07-27 NOTE — Addendum Note (Signed)
Addended by: Tonia Ghent on: 07/27/2022 02:11 PM   Modules accepted: Level of Service

## 2022-07-27 NOTE — Assessment & Plan Note (Signed)
Thyroid nodule.  Regarding his prev eval, 1-yr follow-up ultrasound is recommended for Bethesda 2 nodules to document stability.  If the thyroid nodule is unchanged he may not need anything else done.  If it has changed at 1 year he may need repeat biopsy or other evaluation/treatment. He needs f/u thyroid u/s in 10/2022.  No dysphagia. No neck mass.

## 2022-08-26 DIAGNOSIS — R69 Illness, unspecified: Secondary | ICD-10-CM | POA: Diagnosis not present

## 2022-10-03 DIAGNOSIS — M7542 Impingement syndrome of left shoulder: Secondary | ICD-10-CM | POA: Diagnosis not present

## 2022-10-07 ENCOUNTER — Telehealth: Payer: Self-pay | Admitting: Family Medicine

## 2022-10-07 DIAGNOSIS — E041 Nontoxic single thyroid nodule: Secondary | ICD-10-CM

## 2022-10-07 NOTE — Telephone Encounter (Signed)
Patient is due for follow-up thyroid ultrasound and I put in the order.  Let me know if he does not get a call about scheduling.  Thanks.

## 2022-10-08 NOTE — Telephone Encounter (Signed)
Patient scheduled for [place] on 10/11/22.

## 2022-10-10 ENCOUNTER — Other Ambulatory Visit: Payer: Self-pay | Admitting: Acute Care

## 2022-10-10 DIAGNOSIS — Z87891 Personal history of nicotine dependence: Secondary | ICD-10-CM

## 2022-10-10 DIAGNOSIS — Z122 Encounter for screening for malignant neoplasm of respiratory organs: Secondary | ICD-10-CM

## 2022-10-11 ENCOUNTER — Ambulatory Visit
Admission: RE | Admit: 2022-10-11 | Discharge: 2022-10-11 | Disposition: A | Discharge status: Home / Self Care | Payer: Medicare HMO | Source: Ambulatory Visit | Attending: Family Medicine | Admitting: Family Medicine

## 2022-10-11 DIAGNOSIS — E041 Nontoxic single thyroid nodule: Secondary | ICD-10-CM | POA: Diagnosis not present

## 2022-10-11 DIAGNOSIS — M25512 Pain in left shoulder: Secondary | ICD-10-CM | POA: Diagnosis not present

## 2022-10-18 DIAGNOSIS — M25512 Pain in left shoulder: Secondary | ICD-10-CM | POA: Diagnosis not present

## 2022-11-13 DIAGNOSIS — E785 Hyperlipidemia, unspecified: Secondary | ICD-10-CM | POA: Diagnosis not present

## 2022-11-13 DIAGNOSIS — R5383 Other fatigue: Secondary | ICD-10-CM | POA: Diagnosis not present

## 2022-11-13 DIAGNOSIS — I2510 Atherosclerotic heart disease of native coronary artery without angina pectoris: Secondary | ICD-10-CM | POA: Diagnosis not present

## 2022-11-13 DIAGNOSIS — Z87891 Personal history of nicotine dependence: Secondary | ICD-10-CM | POA: Diagnosis not present

## 2022-11-13 DIAGNOSIS — R001 Bradycardia, unspecified: Secondary | ICD-10-CM | POA: Diagnosis not present

## 2022-11-13 DIAGNOSIS — R079 Chest pain, unspecified: Secondary | ICD-10-CM | POA: Diagnosis not present

## 2022-11-13 DIAGNOSIS — F17200 Nicotine dependence, unspecified, uncomplicated: Secondary | ICD-10-CM | POA: Diagnosis not present

## 2022-11-13 DIAGNOSIS — I2584 Coronary atherosclerosis due to calcified coronary lesion: Secondary | ICD-10-CM | POA: Diagnosis not present

## 2022-11-19 ENCOUNTER — Ambulatory Visit
Admission: RE | Admit: 2022-11-19 | Discharge: 2022-11-19 | Disposition: A | Discharge status: Home / Self Care | Payer: Medicare HMO | Source: Ambulatory Visit | Attending: Acute Care | Admitting: Acute Care

## 2022-11-19 DIAGNOSIS — Z87891 Personal history of nicotine dependence: Secondary | ICD-10-CM | POA: Diagnosis not present

## 2022-11-19 DIAGNOSIS — Z122 Encounter for screening for malignant neoplasm of respiratory organs: Secondary | ICD-10-CM | POA: Insufficient documentation

## 2022-11-22 ENCOUNTER — Other Ambulatory Visit: Payer: Self-pay

## 2022-11-22 DIAGNOSIS — Z87891 Personal history of nicotine dependence: Secondary | ICD-10-CM

## 2022-11-22 DIAGNOSIS — Z122 Encounter for screening for malignant neoplasm of respiratory organs: Secondary | ICD-10-CM

## 2022-12-04 ENCOUNTER — Ambulatory Visit: Payer: Medicare HMO

## 2022-12-14 ENCOUNTER — Ambulatory Visit: Payer: Medicare HMO | Admitting: Urology

## 2022-12-14 ENCOUNTER — Encounter: Payer: Self-pay | Admitting: Urology

## 2022-12-14 VITALS — BP 128/84 | HR 62 | Ht 75.0 in | Wt 212.0 lb

## 2022-12-14 DIAGNOSIS — R82994 Hypercalciuria: Secondary | ICD-10-CM

## 2022-12-14 DIAGNOSIS — N200 Calculus of kidney: Secondary | ICD-10-CM | POA: Diagnosis not present

## 2022-12-14 LAB — URINALYSIS, COMPLETE
Bilirubin, UA: NEGATIVE
Glucose, UA: NEGATIVE
Ketones, UA: NEGATIVE
Leukocytes,UA: NEGATIVE
Nitrite, UA: NEGATIVE
Protein,UA: NEGATIVE
RBC, UA: NEGATIVE
Specific Gravity, UA: 1.02 (ref 1.005–1.030)
Urobilinogen, Ur: 0.2 mg/dL (ref 0.2–1.0)
pH, UA: 7 (ref 5.0–7.5)

## 2022-12-14 LAB — MICROSCOPIC EXAMINATION

## 2022-12-14 NOTE — Progress Notes (Signed)
I, Stephen Hanna, acting as a scribe for Stephen Altes, MD., have documented all relevant documentation on the behalf of Stephen Altes, MD, as directed by Stephen Altes, MD while in the presence of Stephen Altes, MD.  12/14/2022 12:47 PM   Stephen Hanna [birth year] [id number]  Referring provider: Joaquim Nam, MD [address] [place],  [place] [address]  Chief Complaint  Patient presents with   Other   Urologic history: 1.  Nephrolithiasis Ureteroscopic removal of 10 mm right proximal ureteral calculus, 5 mm right lower pole calculus and bladder calculi X3 on 03/01/20 Metabolic evaluation remarkable for hypercalciuria at 382 mg /24 hours Started indapamide December 2021  HPI: Stephen Hanna is a 67 y.o. male presents for annual follow-up.   No significant problems since last year's visit.  He had one mild episode of flank pain a few months ago. Chest CT for lung cancer screening performed July 2024 showed 2 small right renal calculi and no hydronephrosis, which was stable from a lung CT of 1 year prior.   PMH: Past Medical History:  Diagnosis Date   Arthritis    LEFT SHOULDER   Back pain, chronic    after fall from ladder in 1980s with episodic flares   BPH (benign prostatic hyperplasia)    Diverticulitis 10/2010   divertics one small polyp (Dr. Russella Dar)   GERD (gastroesophageal reflux disease)    RARE   History of kidney stones    Hyperglycemia    Hyperlipidemia    Hypertension    H/O   IBS (irritable bowel syndrome)    diarrhea predominant   Personal history of kidney stones 02/07/2005   CT ABD/pelvis --Diverticulitis sig left renal stone // CT ABD w/o negative stones + multiple aortic L.N. 07/24/2004//    Surgical History: Past Surgical History:  Procedure Laterality Date   CHOLECYSTECTOMY     CYSTOSCOPY W/ RETROGRADES Right 03/01/2020   Procedure: CYSTOSCOPY WITH RETROGRADE PYELOGRAM;  Surgeon: Stephen Altes, MD;  Location:  [facility];  Service: Urology;  Laterality: Right;   CYSTOSCOPY WITH LITHOLAPAXY N/A 03/01/2020   Procedure: CYSTOSCOPY WITH LITHOLAPAXY;  Surgeon: Stephen Altes, MD;  Location: [facility];  Service: Urology;  Laterality: N/A;   CYSTOSCOPY/URETEROSCOPY/HOLMIUM LASER/STENT PLACEMENT Right 03/01/2020   Procedure: CYSTOSCOPY/URETEROSCOPY/HOLMIUM LASER/STENT PLACEMENT;  Surgeon: Stephen Altes, MD;  Location: [facility];  Service: Urology;  Laterality: Right;   LAPAROSCOPIC CHOLECYSTECTOMY W/ CHOLANGIOGRAPHY  12/27/2008   Ct abd/pelvis w/o no gallstones mild dilation entire right ureter lap choley (Dr. Lemar Livings)   VASECTOMY  1990   XI ROBOTIC ASSISTED PARAESOPHAGEAL HERNIA REPAIR N/A 09/03/2019   Procedure: XI ROBOTIC ASSISTED Diaphragmatic hernia REPAIR;  Surgeon: Leafy Ro, MD;  Location: [facility];  Service: General;  Laterality: N/A;   XI ROBOTIC ASSISTED VENTRAL HERNIA N/A 02/04/2020   Procedure: XI ROBOTIC ASSISTED VENTRAL HERNIA;  Surgeon: Leafy Ro, MD;  Location: [facility];  Service: General;  Laterality: N/A;    Home Medications:  Allergies as of 12/14/2022       Reactions   Atorvastatin    aches   Chantix [varenicline]    Abnormal dreams   Hydrocodone-acetaminophen Other (See Comments)   edgy, intolerant   Lisinopril Other (See Comments)   cough   Pravastatin    Aches        Medication List        Accurate as of December 14, 2022 12:47 PM. If you  have any questions, ask your nurse or doctor.          cyanocobalamin 1000 MCG tablet Commonly known as: VITAMIN B12 Take 1,000 mcg by mouth daily.   indapamide 2.5 MG tablet Commonly known as: LOZOL TAKE 1 TABLET BY MOUTH EVERY DAY   naproxen 250 MG tablet Commonly known as: NAPROSYN Take 250 mg by mouth daily.   oxyCODONE-acetaminophen 5-325 MG tablet Commonly known as: PERCOCET/ROXICET Take 1 tablet by mouth every 6 (six) hours as needed for severe pain.   psyllium 58.6 % packet Commonly known as:  METAMUCIL Take 1 packet by mouth daily.   Turmeric 500 MG Caps Take 1,000 mg by mouth.        Allergies:  Allergies  Allergen Reactions   Atorvastatin     aches   Chantix [Varenicline]     Abnormal dreams   Hydrocodone-Acetaminophen Other (See Comments)    edgy, intolerant   Lisinopril Other (See Comments)    cough   Pravastatin     Aches    Family History: Family History  Problem Relation Age of Onset   Heart disease Father 33       MI   Kidney disease Father    Depression Maternal Grandmother    Alcohol abuse Neg Hx    Stroke Neg Hx    Prostate cancer Neg Hx    Colon cancer Neg Hx     Social History:  reports that he quit smoking about 7 years ago. His smoking use included cigarettes. He started smoking about 37 years ago. He has a 30 pack-year smoking history. He has never used smokeless tobacco. He reports current alcohol use of about 1.0 standard drink of alcohol per week. He reports that he does not use drugs.   Physical Exam: BP 128/84   Pulse 62   Ht 6\' 3"  (1.905 m)   Wt 212 lb (96.2 kg)   BMI 26.50 kg/m   Constitutional:  Alert and oriented, No acute distress. HEENT: Stephen Hanna AT, moist mucus membranes.  Trachea midline, no masses. Cardiovascular: No clubbing, cyanosis, or edema. Respiratory: Normal respiratory effort, no increased work of breathing. GI: Abdomen is soft, nontender, nondistended, no abdominal masses Skin: No rashes, bruises or suspicious lesions. Neurologic: Grossly intact, no focal deficits, moving all 4 extremities. Psychiatric: Normal mood and affect.   Pertinent Imaging: CT was personally reviewed and interpreted.   CT EXAM: CT CHEST WITHOUT CONTRAST LOW-DOSE FOR LUNG CANCER SCREENING   TECHNIQUE: Multidetector CT imaging of the chest was performed following the standard protocol without IV contrast.   RADIATION DOSE REDUCTION: This exam was performed according to the departmental dose-optimization program which includes  automated exposure control, adjustment of the mA and/or kV according to patient size and/or use of iterative reconstruction technique.   COMPARISON:  Low-dose lung cancer screening chest CT 08/17/2021.   FINDINGS: Cardiovascular: Heart size is normal. There is no significant pericardial fluid, thickening or pericardial calcification. There is aortic atherosclerosis, as well as atherosclerosis of the great vessels of the mediastinum and the coronary arteries, including calcified atherosclerotic plaque in the left anterior descending, left circumflex and right coronary arteries.   Mediastinum/Nodes: No pathologically enlarged mediastinal or hilar lymph nodes. Please note that accurate exclusion of hilar adenopathy is limited on noncontrast CT scans. Esophagus is unremarkable in appearance. No axillary lymphadenopathy.   Lungs/Pleura: Previously noted right upper lobe nodule has regressed, currently a small nodular area of architectural distortion (axial image 59) with a volume derived  mean diameter of only 3.5 mm, indicative of a benign area of post infectious or inflammatory scarring. No other suspicious appearing pulmonary nodules or masses are noted. No acute consolidative airspace disease. No pleural effusions. Small amount of debris is noted in the right mainstem bronchus lying dependently, presumably retained mucus. Mild diffuse bronchial wall thickening with mild centrilobular and paraseptal emphysema.   Upper Abdomen: Nonobstructive calculi measuring up to 4 mm in the upper pole collecting system of the right kidney.   Musculoskeletal: There are no aggressive appearing lytic or blastic lesions noted in the visualized portions of the skeleton.   IMPRESSION: 1. Lung-RADS 2S, benign appearance or behavior. Continue annual screening with low-dose chest CT without contrast in 12 months. 2. The "S" modifier above refers to potentially clinically significant non lung cancer  related findings. Specifically, there is aortic atherosclerosis, in addition to three-vessel coronary artery disease. Please note that although the presence of coronary artery calcium documents the presence of coronary artery disease, the severity of this disease and any potential stenosis cannot be assessed on this non-gated CT examination. Assessment for potential risk factor modification, dietary therapy or pharmacologic therapy may be warranted, if clinically indicated. 3. Mild diffuse bronchial wall thickening with mild centrilobular and paraseptal emphysema; imaging findings suggestive of underlying COPD. 4. Nonobstructive calculi in the upper pole collecting system of the right kidney measuring up to 4 mm.   Aortic Atherosclerosis (ICD10-I70.0) and Emphysema (ICD10-J43.9).     Electronically Signed   By: Trudie Reed M.D.   On: 11/22/2022 07:44  Assessment & Plan:    1. Nephrolithiasis  Stable, non-obstructing right renal calculi.   2. Hypercalciuria Continue indapamide  [facility] [address], [address] [place], [place] [address] [phone]

## 2022-12-15 ENCOUNTER — Encounter: Payer: Self-pay | Admitting: Urology

## 2022-12-15 MED ORDER — OXYCODONE-ACETAMINOPHEN 5-325 MG PO TABS: 1.0000 | ORAL_TABLET | Freq: Four times a day (QID) | ORAL | 0 refills | Status: DC | PRN

## 2022-12-21 DIAGNOSIS — M75120 Complete rotator cuff tear or rupture of unspecified shoulder, not specified as traumatic: Secondary | ICD-10-CM | POA: Insufficient documentation

## 2022-12-21 DIAGNOSIS — M19019 Primary osteoarthritis, unspecified shoulder: Secondary | ICD-10-CM | POA: Insufficient documentation

## 2023-01-03 ENCOUNTER — Ambulatory Visit: Payer: Medicare HMO | Admitting: Pulmonary Disease

## 2023-01-03 ENCOUNTER — Encounter: Payer: Self-pay | Admitting: Pulmonary Disease

## 2023-01-03 VITALS — BP 124/80 | HR 71 | Temp 98.3°F | Ht 75.0 in | Wt 217.2 lb

## 2023-01-03 DIAGNOSIS — Z87891 Personal history of nicotine dependence: Secondary | ICD-10-CM | POA: Diagnosis not present

## 2023-01-03 DIAGNOSIS — J449 Chronic obstructive pulmonary disease, unspecified: Secondary | ICD-10-CM

## 2023-01-03 DIAGNOSIS — R911 Solitary pulmonary nodule: Secondary | ICD-10-CM | POA: Diagnosis not present

## 2023-01-03 NOTE — Progress Notes (Signed)
Subjective:    Patient ID: Stephen Hanna, male    DOB: 18-Jun-1955, 67 y.o.   MRN: [id number]  Patient Care Team: Joaquim Nam, MD as PCP - General (Family Medicine) Glendale Chard, DO as Consulting Physician (Neurology) Joaquim Nam, MD (Family Medicine)  Chief Complaint  Patient presents with   Follow-up    Nodule. No SOB, wheezing or cough.    HPI Patient is a 67 year old former smoker (30 PY) who presents for follow-up of an incidental lung nodule noted on low-dose cancer screening.  This is a follow-up visit.  He was last seen 20 December 2021.  The initial finding was on 17 August 2021 showing a 9.7 mm "aggressive looking" nodule subpleural on the right upper lobe.  This was followed by a PET/CT on 07 Sep 2021 which showed diminution of this nodule to 5 mm and no significant SUV activity.  The patient at that time had been treated with antibiotics for a different issue.  Patient was initially evaluated here on 19 Sep 2021.  At that time the recommendation was to do a 61-month follow-up CT.  This was obtained on 01 December 2021.  The nodule in question reduced to 3 mm in size indicating this was likely related to inflammatory/infectious process which was resolving.  He had a repeat low-dose CT scan of the chest on 19 November 2022 that showed complete resolution of this nodule.  There is residual postinflammatory scarring.  There is also mild diffuse bronchial wall thickening and mild centrilobular and paraseptal emphysema with some retained secretions noted on the trachea and major bronchi.  This raises the issue of COPD.  CT also showed coronary artery calcifications.  He is followed by cardiology (Dr. Dorothyann Peng) for this issue.  The patient remains relatively asymptomatic however he has developed morning cough productive of whitish sputum.  No mopped assist.  He has had no fevers, chills or sweats.  He does not endorse any dyspnea, orthopnea or paroxysmal nocturnal dyspnea.  No lower  extremity edema and no calf tenderness.  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  Is outdoors a bit and does significant amount of walking during the day.  He has not relapsed and smoking.  Overall he feels well and looks well.   Review of Systems A 10 point review of systems was performed and it is as noted above otherwise negative.   Patient Active Problem List   Diagnosis Date Noted   Cough 09/13/2021   Thyroid nodule 09/13/2021   Lung nodule 09/13/2021   Chest wall pain 07/19/2021   Healthcare maintenance 04/06/2020   Bladder calculi 02/18/2020   Diaphragmatic hernia 08/12/2019   BPH (benign prostatic hyperplasia) 08/12/2019   Facial paresthesia 03/05/2019   Coronary artery calcification 03/26/2018   Personal history of tobacco use, presenting hazards to health 02/24/2016   Pain in joint, ankle and foot 02/10/2016   Former smoker 02/10/2016   History of hypertension 05/16/2014   Advance care planning 01/28/2014   Pain in joint, shoulder region 01/28/2014   Pain in right knee 03/09/2013   Skin thickening 01/26/2013   Fatigue 01/04/2011   IRRITABLE BOWEL SYNDROME 06/22/2010   Recurrent nephrolithiasis 11/23/2008   BENIGN PROSTATIC HYPERTROPHY, MILD, HX OF 10/14/2008   HLD (hyperlipidemia) 10/06/2008   COLONIC POLYPS 10/08/2007   DIVERTICULOSIS OF COLON 10/08/2007   Hyperglycemia 10/08/2007   BACK PAIN, LUMBAR 01/01/2007    Social History   Tobacco Use   Smoking  status: Former    Current packs/day: 0.00    Average packs/day: 1 pack/day for 30.0 years (30.0 ttl pk-yrs)    Types: Cigarettes    Start date: 01/27/1985    Quit date: 01/28/2015    Years since quitting: 7.9   Smokeless tobacco: Never   Tobacco comments:    quit 6 years ago  Substance Use Topics   Alcohol use: Yes    Alcohol/week: 1.0 standard drink of alcohol    Types: 1 Standard drinks or equivalent per week    Comment: 1 drink a month    Allergies  Allergen Reactions    Atorvastatin     aches   Chantix [Varenicline]     Abnormal dreams   Hydrocodone-Acetaminophen Other (See Comments)    edgy, intolerant   Lisinopril Other (See Comments)    cough   Pravastatin     Aches    Current Meds  Medication Sig   indapamide (LOZOL) 2.5 MG tablet TAKE 1 TABLET BY MOUTH EVERY DAY   naproxen (NAPROSYN) 250 MG tablet Take 250 mg by mouth daily.   oxyCODONE-acetaminophen (PERCOCET/ROXICET) 5-325 MG tablet Take 1 tablet by mouth every 6 (six) hours as needed for severe pain.   psyllium (METAMUCIL) 58.6 % packet Take 1 packet by mouth daily.   Turmeric 500 MG CAPS Take 1,000 mg by mouth.   vitamin B-12 (CYANOCOBALAMIN) 1000 MCG tablet Take 1,000 mcg by mouth daily.    Immunization History  Administered Date(s) Administered   Fluad Quad(high Dose 65+) 03/02/2019, 04/05/2020   Influenza Split 04/11/2011   Influenza Whole 04/17/2007, 02/10/2008   Influenza,inj,Quad PF,6+ Mos 01/26/2013, 01/28/2014, 02/01/2015, 02/11/2017, 02/17/2018   Moderna Sars-Covid-2 Vaccination 11/06/2019, 12/12/2019   Pneumococcal Polysaccharide-23 03/02/2019   Td 05/07/2004   Tdap 02/01/2015   Zoster, Live 01/28/2014        Objective:   BP 124/80 (BP Location: Right Arm, Cuff Size: Normal)   Pulse 71   Temp 98.3 F (36.8 C)   Ht 6\' 3"  (1.905 m)   Wt 217 lb 3.2 oz (98.5 kg)   SpO2 96%   BMI 27.15 kg/m   SpO2: 96 % O2 Device: None (Room air)  GENERAL: Well-developed, well-nourished gentleman, fit appearing, fully ambulatory, no acute distress.  No conversational dyspnea. HEAD: Normocephalic, atraumatic.  EYES: Pupils equal, round, reactive to light.  No scleral icterus.  MOUTH: Oral mucosa moist.  No thrush.  Dentition intact. NECK: Supple. No thyromegaly. Trachea midline. No JVD.  No adenopathy. PULMONARY: Good air entry bilaterally.  No adventitious sounds. CARDIOVASCULAR: S1 and S2. Regular rate and rhythm.  No rubs, murmurs or gallops heard. ABDOMEN:  Benign. MUSCULOSKELETAL: No joint deformity, no clubbing, no edema.  NEUROLOGIC: No overt focal deficit, no gait disturbance, speech is fluent. SKIN: Intact,warm,dry. PSYCH: Mood and behavior normal.      Assessment & Plan:     ICD-10-CM   1. COPD suggested by initial evaluation [facility])  J44.9 Pulmonary Function Test [facility] Only   PFTs Suspect COPD noted on CT chest    2. Lung nodule seen on imaging study  R91.1    RESOLVED Inflammatory/infectious Continue yearly lung cancer screening    3. Former cigarette smoker  Z87.891    No evidence of relapse      Orders Placed This Encounter  Procedures   Pulmonary Function Test [facility] Only    Standing Status:   Future    Standing Expiration Date:   01/03/2024    Order Specific Question:  Full PFT: includes the following: basic spirometry, spirometry pre & post bronchodilator, diffusion capacity (DLCO), lung volumes    Answer:   Full PFT    Order Specific Question:   This test can only be performed at    Answer:   [facility]   We will schedule PFTs on let the patient know results.  Will see the patient in follow-up in 6 months time or as needed.    Gailen Shelter, MD Advanced Bronchoscopy P[facility]    *This note was dictated using voice recognition software/Dragon.  Despite best efforts to proofread, errors can occur which can change the meaning. Any transcriptional errors that result from this process are unintentional and may not be fully corrected at the time of dictation.

## 2023-01-03 NOTE — Patient Instructions (Addendum)
Your lungs sounded clear.  Your CT of the chest looked good.  You did have some mucus in the airways were going to check some breathing test to make sure there is no mild COPD involved in this.  Otherwise continue doing what you are doing.  Continue staying active.  We will see you in follow-up in 6 months time or as needed.

## 2023-02-05 DIAGNOSIS — Z9889 Other specified postprocedural states: Secondary | ICD-10-CM | POA: Insufficient documentation

## 2023-02-18 DIAGNOSIS — M25612 Stiffness of left shoulder, not elsewhere classified: Secondary | ICD-10-CM | POA: Insufficient documentation

## 2023-03-04 ENCOUNTER — Ambulatory Visit: Payer: Medicare HMO

## 2023-03-11 DIAGNOSIS — Z4889 Encounter for other specified surgical aftercare: Secondary | ICD-10-CM | POA: Insufficient documentation

## 2023-03-21 ENCOUNTER — Other Ambulatory Visit: Payer: Self-pay | Admitting: Urology

## 2023-05-09 DIAGNOSIS — H0288B Meibomian gland dysfunction left eye, upper and lower eyelids: Secondary | ICD-10-CM | POA: Diagnosis not present

## 2023-05-09 DIAGNOSIS — H2513 Age-related nuclear cataract, bilateral: Secondary | ICD-10-CM | POA: Diagnosis not present

## 2023-05-09 DIAGNOSIS — H04123 Dry eye syndrome of bilateral lacrimal glands: Secondary | ICD-10-CM | POA: Diagnosis not present

## 2023-05-09 DIAGNOSIS — H0288A Meibomian gland dysfunction right eye, upper and lower eyelids: Secondary | ICD-10-CM | POA: Diagnosis not present

## 2023-06-25 ENCOUNTER — Ambulatory Visit (INDEPENDENT_AMBULATORY_CARE_PROVIDER_SITE_OTHER): Payer: Medicare HMO

## 2023-06-25 VITALS — Ht 75.0 in | Wt 217.0 lb

## 2023-06-25 DIAGNOSIS — Z0000 Encounter for general adult medical examination without abnormal findings: Secondary | ICD-10-CM | POA: Diagnosis not present

## 2023-06-25 NOTE — Progress Notes (Signed)
 Subjective:   Stephen Hanna is a 68 y.o. male who presents for Medicare Annual/Subsequent preventive examination.  Visit Complete: Virtual I connected with  Stephen Hanna on 06/25/23 by a audio enabled telemedicine application and verified that I am speaking with the correct person using two identifiers.  Patient Location: Home  Provider Location: Home Office  I discussed the limitations of evaluation and management by telemedicine. The patient expressed understanding and agreed to proceed.  Vital Signs: Because this visit was a virtual/telehealth visit, some criteria may be missing or patient reported. Any vitals not documented were not able to be obtained and vitals that have been documented are patient reported.  Patient Medicare AWV questionnaire was completed by the patient on (not done); I have confirmed that all information answered by patient is correct and no changes since this date.  Cardiac Risk Factors include: advanced age (>34men, >68 women);dyslipidemia;male gender    Objective:    Today's Vitals   06/25/23 0849  Weight: 217 lb (98.4 kg)  Height: 6\' 3"  (1.905 m)  PainSc: 3    Body mass index is 27.12 kg/m.     06/25/2023    9:10 AM 06/20/2022    9:01 AM 04/03/2021    1:17 PM 01/28/2020    9:41 AM 09/03/2019    6:50 AM 08/28/2019   12:16 PM 08/25/2019    9:02 AM  Advanced Directives  Does Patient Have a Medical Advance Directive? No Yes Yes No Yes Yes Yes  Type of Advance Directive  Living will;Healthcare Power of [employer] Power of [place];Living will  Living will;Healthcare Power of [employer] Power of [place];Living will  Does patient want to make changes to medical advance directive?   Yes (MAU/Ambulatory/Procedural Areas - Information given)  No - Patient declined    Copy of Healthcare Power of Attorney in Chart?  No - copy requested   No - copy requested  No - copy requested  Would patient like information on creating a medical  advance directive?    No - Patient declined       Current Medications (verified) Outpatient Encounter Medications as of 06/25/2023  Medication Sig   indapamide (LOZOL) 2.5 MG tablet TAKE 1 TABLET BY MOUTH EVERY DAY   psyllium (METAMUCIL) 58.6 % packet Take 1 packet by mouth daily.   Turmeric 500 MG CAPS Take 1,000 mg by mouth.   vitamin B-12 (CYANOCOBALAMIN) 1000 MCG tablet Take 1,000 mcg by mouth daily.   naproxen (NAPROSYN) 250 MG tablet Take 250 mg by mouth daily.   oxyCODONE-acetaminophen (PERCOCET/ROXICET) 5-325 MG tablet Take 1 tablet by mouth every 6 (six) hours as needed for severe pain. (Patient not taking: Reported on 06/25/2023)   No facility-administered encounter medications on file as of 06/25/2023.    Allergies (verified) Atorvastatin, Chantix [varenicline], Hydrocodone-acetaminophen, Lisinopril, and Pravastatin   History: Past Medical History:  Diagnosis Date   Arthritis    LEFT SHOULDER   Back pain, chronic    after fall from ladder in 1980s with episodic flares   BPH (benign prostatic hyperplasia)    Diverticulitis 10/2010   divertics one small polyp (Dr. Russella Dar)   GERD (gastroesophageal reflux disease)    RARE   History of kidney stones    Hyperglycemia    Hyperlipidemia    Hypertension    H/O   IBS (irritable bowel syndrome)    diarrhea predominant   Personal history of kidney stones 02/07/2005   CT ABD/pelvis --Diverticulitis sig left renal  stone // CT ABD w/o negative stones + multiple aortic L.N. 07/24/2004//   Past Surgical History:  Procedure Laterality Date   CHOLECYSTECTOMY     CYSTOSCOPY W/ RETROGRADES Right 03/01/2020   Procedure: CYSTOSCOPY WITH RETROGRADE PYELOGRAM;  Surgeon: Riki Altes, MD;  Location: [facility];  Service: Urology;  Laterality: Right;   CYSTOSCOPY WITH LITHOLAPAXY N/A 03/01/2020   Procedure: CYSTOSCOPY WITH LITHOLAPAXY;  Surgeon: Riki Altes, MD;  Location: [facility];  Service: Urology;  Laterality: N/A;    CYSTOSCOPY/URETEROSCOPY/HOLMIUM LASER/STENT PLACEMENT Right 03/01/2020   Procedure: CYSTOSCOPY/URETEROSCOPY/HOLMIUM LASER/STENT PLACEMENT;  Surgeon: Riki Altes, MD;  Location: [facility];  Service: Urology;  Laterality: Right;   LAPAROSCOPIC CHOLECYSTECTOMY W/ CHOLANGIOGRAPHY  12/27/2008   Ct abd/pelvis w/o no gallstones mild dilation entire right ureter lap choley (Dr. Lemar Livings)   VASECTOMY  1990   XI ROBOTIC ASSISTED PARAESOPHAGEAL HERNIA REPAIR N/A 09/03/2019   Procedure: XI ROBOTIC ASSISTED Diaphragmatic hernia REPAIR;  Surgeon: Leafy Ro, MD;  Location: [facility];  Service: General;  Laterality: N/A;   XI ROBOTIC ASSISTED VENTRAL HERNIA N/A 02/04/2020   Procedure: XI ROBOTIC ASSISTED VENTRAL HERNIA;  Surgeon: Leafy Ro, MD;  Location: [facility];  Service: General;  Laterality: N/A;   Family History  Problem Relation Age of Onset   Heart disease Father 38       MI   Kidney disease Father    Depression Maternal Grandmother    Alcohol abuse Neg Hx    Stroke Neg Hx    Prostate cancer Neg Hx    Colon cancer Neg Hx    Social History   Socioeconomic History   Marital status: Married    Spouse name: Not on file   Number of children: Not on file   Years of education: Not on file   Highest education level: Not on file  Occupational History   Not on file  Tobacco Use   Smoking status: Former    Current packs/day: 0.00    Average packs/day: 1 pack/day for 30.0 years (30.0 ttl pk-yrs)    Types: Cigarettes    Start date: 01/27/1985    Quit date: 01/28/2015    Years since quitting: 8.4   Smokeless tobacco: Never   Tobacco comments:    quit 6 years ago  Vaping Use   Vaping status: Never Used  Substance and Sexual Activity   Alcohol use: Yes    Alcohol/week: 1.0 standard drink of alcohol    Types: 1 Standard drinks or equivalent per week    Comment: 1 drink a month   Drug use: No   Sexual activity: Yes  Other Topics Concern   Not on file  Social History Narrative    Retired from [employer], Curator   Former Surveyor, minerals, also working at golf course   Left handed   Lives in a single story home with wife   Social Drivers of Corporate investment banker Strain: Low Risk  (06/25/2023)   Overall Financial Resource Strain (CARDIA)    Difficulty of Paying Living Expenses: Not hard at all  Food Insecurity: No Food Insecurity (06/25/2023)   Hunger Vital Sign    Worried About Running Out of Food in the Last Year: Never true    Ran Out of Food in the Last Year: Never true  Transportation Needs: No Transportation Needs (06/25/2023)   PRAPARE - Administrator, Civil Service (Medical): No    Lack of Transportation (Non-Medical): No  Physical Activity:  Sufficiently Active (06/25/2023)   Exercise Vital Sign    Days of Exercise per Week: 3 days    Minutes of Exercise per Session: 60 min  Stress: No Stress Concern Present (06/25/2023)   [employer] of Occupational Health - Occupational Stress Questionnaire    Feeling of Stress : Not at all  Social Connections: Moderately Integrated (06/25/2023)   Social Connection and Isolation Panel [NHANES]    Frequency of Communication with Friends and Family: More than three times a week    Frequency of Social Gatherings with Friends and Family: More than three times a week    Attends Religious Services: Never    Database administrator or Organizations: Yes    Attends Engineer, structural: More than 4 times per year    Marital Status: Married    Tobacco Counseling Counseling given: Not Answered Tobacco comments: quit 6 years ago  Clinical Intake:  Pre-visit preparation completed: No  Pain : 0-10 Pain Score: 3  Pain Type: Other (Comment) (post surgical pain) Pain Location: Shoulder Pain Orientation: Left Pain Descriptors / Indicators: Aching Pain Onset: More than a month ago Pain Frequency: Intermittent Pain Relieving Factors: Aleive, PT, ice, heat from shower  Pain Relieving Factors:  Aleive, PT, ice, heat from shower  BMI - recorded: 27.12 Nutritional Status: BMI 25 -29 Overweight Nutritional Risks: None Diabetes: No  How often do you need to have someone help you when you read instructions, pamphlets, or other written materials from your doctor or pharmacy?: 1 - Never  Interpreter Needed?: No  Comments: lives with wife Information entered by :: B.Winnie Umali,LPN   Activities of Daily Living    06/25/2023    8:57 AM  In your present state of health, do you have any difficulty performing the following activities:  Hearing? 0  Vision? 0  Difficulty concentrating or making decisions? 0  Walking or climbing stairs? 0  Dressing or bathing? 0  Doing errands, shopping? 0  Preparing Food and eating ? N  Using the Toilet? N  In the past six months, have you accidently leaked urine? N  Do you have problems with loss of bowel control? N  Managing your Medications? N  Managing your Finances? N  Housekeeping or managing your Housekeeping? N    Patient Care Team: Joaquim Nam, MD as PCP - General (Family Medicine) Glendale Chard, DO as Consulting Physician (Neurology) Joaquim Nam, MD (Family Medicine)  Indicate any recent Medical Services you may have received from other than [facility] providers in the past year (date may be approximate).     Assessment:   This is a routine wellness examination for Ameya.  Hearing/Vision screen Hearing Screening - Comments:: Pt says his hearing is good Vision Screening - Comments:: Pt says his vision is good;readers only Dr Clydene Pugh   Goals Addressed             This Visit's Progress    COMPLETED: Patient Stated       08/25/2019, I will maintain and continue medications as prescribed.      Patient Stated   Not on track    06/25/23-Would like maintain current health and get back to playing and working in his yard       Depression Screen    06/25/2023    8:55 AM 07/26/2022    8:22 AM 06/20/2022    8:52 AM  04/03/2021    1:20 PM 08/25/2019    9:03 AM 03/02/2019    8:54  AM 02/17/2018    9:00 AM  PHQ 2/9 Scores  PHQ - 2 Score 0 0 0 0 0 0 0  PHQ- 9 Score  0   0      Fall Risk    06/25/2023    8:52 AM 07/26/2022    8:16 AM 06/20/2022    8:59 AM 04/03/2021    1:19 PM 04/05/2020    9:36 AM  Fall Risk   Falls in the past year? 0 0 0 0 0  Number falls in past yr: 0 0 0 0 0  Injury with Fall? 0 0 0 0 0  Risk for fall due to : No Fall Risks No Fall Risks No Fall Risks No Fall Risks   Follow up Education provided;Falls prevention discussed Falls evaluation completed  Falls prevention discussed     MEDICARE RISK AT HOME: Medicare Risk at Home Any stairs in or around the home?: Yes If so, are there any without handrails?: Yes Home free of loose throw rugs in walkways, pet beds, electrical cords, etc?: Yes Adequate lighting in your home to reduce risk of falls?: Yes Life alert?: No Use of a cane, walker or w/c?: No Grab bars in the bathroom?: Yes Shower chair or bench in shower?: No Elevated toilet seat or a handicapped toilet?: No  TIMED UP AND GO:  Was the test performed?  No    Cognitive Function:    08/25/2019    9:04 AM  MMSE - Mini Mental State Exam  Orientation to time 5  Orientation to Place 5  Registration 3  Attention/ Calculation 0  Recall 3  Language- repeat 1        06/25/2023    8:59 AM 06/20/2022    8:55 AM  6CIT Screen  What Year? 0 points 0 points  What month? 0 points 0 points  What time? 0 points 0 points  Count back from 20 2 points 0 points  Months in reverse 2 points 0 points  Repeat phrase 2 points 2 points  Total Score 6 points 2 points    Immunizations Immunization History  Administered Date(s) Administered   Fluad Quad(high Dose 65+) 03/02/2019, 04/05/2020   Influenza Split 04/11/2011   Influenza Whole 04/17/2007, 02/10/2008   Influenza,inj,Quad PF,6+ Mos 01/26/2013, 01/28/2014, 02/01/2015, 02/11/2017, 02/17/2018   Moderna Sars-Covid-2  Vaccination 11/06/2019, 12/12/2019   Pneumococcal Polysaccharide-23 03/02/2019   Td 05/07/2004   Tdap 02/01/2015   Zoster, Live 01/28/2014    TDAP status: Up to date  Flu Vaccine status: Declined, Education has been provided regarding the importance of this vaccine but patient still declined. Advised may receive this vaccine at local pharmacy or Health Dept. Aware to provide a copy of the vaccination record if obtained from local pharmacy or Health Dept. Verbalized acceptance and understanding.  Pneumococcal vaccine status: Up to date  Covid-19 vaccine status: Completed vaccines  Qualifies for Shingles Vaccine? Yes   Zostavax completed No   Shingrix Completed?: No.    Education has been provided regarding the importance of this vaccine. Patient has been advised to call insurance company to determine out of pocket expense if they have not yet received this vaccine. Advised may also receive vaccine at local pharmacy or Health Dept. Verbalized acceptance and understanding.  Screening Tests Health Maintenance  Topic Date Due   Zoster Vaccines- Shingrix (1 of 2) 09/03/2003   Pneumonia Vaccine 68+ Years old (2 of 2 - PCV) 03/01/2020   INFLUENZA VACCINE  12/06/2022   COVID-19  Vaccine (3 - 2024-25 season) 01/06/2023   Lung Cancer Screening  11/19/2023   Medicare Annual Wellness (AWV)  06/24/2024   DTaP/Tdap/Td (3 - Td or Tdap) 01/31/2025   Colonoscopy  03/28/2025   Hepatitis C Screening  Completed   HPV VACCINES  Aged Out    Health Maintenance  Health Maintenance Due  Topic Date Due   Zoster Vaccines- Shingrix (1 of 2) 09/03/2003   Pneumonia Vaccine 64+ Years old (2 of 2 - PCV) 03/01/2020   INFLUENZA VACCINE  12/06/2022   COVID-19 Vaccine (3 - 2024-25 season) 01/06/2023    Colorectal cancer screening: Type of screening: Colonoscopy. Completed 03/29/2015. Repeat every 10 years  Lung Cancer Screening: (Low Dose CT Chest recommended if Age 62-80 years, 20 pack-year currently  smoking OR have quit w/in 15years.) does not qualify.   Lung Cancer Screening Referral: no  Additional Screening:  Hepatitis C Screening: does not qualify; Completed 02/01/2015  Vision Screening: Recommended annual ophthalmology exams for early detection of glaucoma and other disorders of the eye. Is the patient up to date with their annual eye exam?  Yes  Who is the provider or what is the name of the office in which the patient attends annual eye exams? Dr Clydene Pugh If pt is not established with a provider, would they like to be referred to a provider to establish care? No .   Dental Screening: Recommended annual dental exams for proper oral hygiene  Diabetic Foot Exam: n/a  Community Resource Referral / Chronic Care Management: CRR required this visit?  No   CCM required this visit?  Appt scheduled with PCP PE and Lab appts    Plan:     I have personally reviewed and noted the following in the patient's chart:   Medical and social history Use of alcohol, tobacco or illicit drugs  Current medications and supplements including opioid prescriptions. Patient is not currently taking opioid prescriptions. Functional ability and status Nutritional status Physical activity Advanced directives List of other physicians Hospitalizations, surgeries, and ER visits in previous 12 months Vitals Screenings to include cognitive, depression, and falls Referrals and appointments  In addition, I have reviewed and discussed with patient certain preventive protocols, quality metrics, and best practice recommendations. A written personalized care plan for preventive services as well as general preventive health recommendations were provided to patient.     Sue Lush, LPN   [id number]/[id number]   After Visit Summary: (Declined) Due to this being a telephonic visit, with patients personalized plan was offered to patient but patient Declined AVS at this time   Nurse Notes: Pt states he is doing  well and has no concerns or questions. He does ask to opt out of AWV (he only wants visits to his md) and wants no communications or reminders from [employer].

## 2023-06-25 NOTE — Patient Instructions (Signed)
 Stephen Hanna , Thank you for taking time to come for your Medicare Wellness Visit. I appreciate your ongoing commitment to your health goals. Please review the following plan we discussed and let me know if I can assist you in the future.   Referrals/Orders/Follow-Ups/Clinician Recommendations: none  This is a list of the screening recommended for you and due dates:  Health Maintenance  Topic Date Due   Zoster (Shingles) Vaccine (1 of 2) 09/03/2003   Pneumonia Vaccine (2 of 2 - PCV) 03/01/2020   Flu Shot  12/06/2022   COVID-19 Vaccine (3 - 2024-25 season) 01/06/2023   Screening for Lung Cancer  11/19/2023   Medicare Annual Wellness Visit  06/24/2024   DTaP/Tdap/Td vaccine (3 - Td or Tdap) 01/31/2025   Colon Cancer Screening  03/28/2025   Hepatitis C Screening  Completed   HPV Vaccine  Aged Out    Advanced directives: (Declined) Advance directive discussed with you today. Even though you declined this today, please call our office should you change your mind, and we can give you the proper paperwork for you to fill out.  Next Medicare Annual Wellness Visit scheduled for next year: No Pt declined: wants to OPT out of AWV w/nurse.

## 2023-06-26 ENCOUNTER — Other Ambulatory Visit: Payer: Self-pay | Admitting: Family Medicine

## 2023-06-26 DIAGNOSIS — E041 Nontoxic single thyroid nodule: Secondary | ICD-10-CM

## 2023-06-26 DIAGNOSIS — E785 Hyperlipidemia, unspecified: Secondary | ICD-10-CM

## 2023-06-26 DIAGNOSIS — Z125 Encounter for screening for malignant neoplasm of prostate: Secondary | ICD-10-CM

## 2023-07-23 ENCOUNTER — Other Ambulatory Visit (INDEPENDENT_AMBULATORY_CARE_PROVIDER_SITE_OTHER): Payer: Medicare HMO

## 2023-07-23 DIAGNOSIS — E041 Nontoxic single thyroid nodule: Secondary | ICD-10-CM | POA: Diagnosis not present

## 2023-07-23 DIAGNOSIS — E785 Hyperlipidemia, unspecified: Secondary | ICD-10-CM | POA: Diagnosis not present

## 2023-07-23 DIAGNOSIS — Z125 Encounter for screening for malignant neoplasm of prostate: Secondary | ICD-10-CM | POA: Diagnosis not present

## 2023-07-23 LAB — LIPID PANEL
Cholesterol: 206 mg/dL — ABNORMAL HIGH (ref 0–200)
HDL: 38.8 mg/dL — ABNORMAL LOW (ref >39.00)
LDL Cholesterol: 129 mg/dL — ABNORMAL HIGH (ref 0–99)
NonHDL: 167.42
Total CHOL/HDL Ratio: 5
Triglycerides: 190 mg/dL — ABNORMAL HIGH (ref 0.0–149.0)
VLDL: 38 mg/dL (ref 0.0–40.0)

## 2023-07-23 LAB — COMPREHENSIVE METABOLIC PANEL
ALT: 35 U/L (ref 0–53)
AST: 20 U/L (ref 0–37)
Albumin: 4.5 g/dL (ref 3.5–5.2)
Alkaline Phosphatase: 53 U/L (ref 39–117)
BUN: 18 mg/dL (ref 6–23)
CO2: 31 meq/L (ref 19–32)
Calcium: 9.8 mg/dL (ref 8.4–10.5)
Chloride: 103 meq/L (ref 96–112)
Creatinine, Ser: 0.97 mg/dL (ref 0.40–1.50)
GFR: 79.44 mL/min (ref >60.00)
Glucose, Bld: 108 mg/dL — ABNORMAL HIGH (ref 70–99)
Potassium: 4.1 meq/L (ref 3.5–5.1)
Sodium: 143 meq/L (ref 135–145)
Total Bilirubin: 0.5 mg/dL (ref 0.2–1.2)
Total Protein: 6.6 g/dL (ref 6.0–8.3)

## 2023-07-23 LAB — TSH: TSH: 2.94 u[IU]/mL (ref 0.35–5.50)

## 2023-07-23 LAB — PSA, MEDICARE: PSA: 2.75 ng/mL (ref 0.10–4.00)

## 2023-07-30 ENCOUNTER — Ambulatory Visit (INDEPENDENT_AMBULATORY_CARE_PROVIDER_SITE_OTHER): Payer: Medicare HMO | Admitting: Family Medicine

## 2023-07-30 ENCOUNTER — Encounter: Payer: Self-pay | Admitting: Family Medicine

## 2023-07-30 VITALS — BP 124/68 | HR 62 | Temp 98.2°F | Ht 73.5 in | Wt 222.0 lb

## 2023-07-30 DIAGNOSIS — I2510 Atherosclerotic heart disease of native coronary artery without angina pectoris: Secondary | ICD-10-CM

## 2023-07-30 DIAGNOSIS — Z0000 Encounter for general adult medical examination without abnormal findings: Secondary | ICD-10-CM

## 2023-07-30 DIAGNOSIS — E041 Nontoxic single thyroid nodule: Secondary | ICD-10-CM

## 2023-07-30 DIAGNOSIS — Z7189 Other specified counseling: Secondary | ICD-10-CM

## 2023-07-30 DIAGNOSIS — Z23 Encounter for immunization: Secondary | ICD-10-CM

## 2023-07-30 DIAGNOSIS — N200 Calculus of kidney: Secondary | ICD-10-CM

## 2023-07-30 DIAGNOSIS — M545 Low back pain, unspecified: Secondary | ICD-10-CM

## 2023-07-30 DIAGNOSIS — M75120 Complete rotator cuff tear or rupture of unspecified shoulder, not specified as traumatic: Secondary | ICD-10-CM

## 2023-07-30 NOTE — Progress Notes (Unsigned)
 Flu d/w pt. Shingles discussed with patient PNA 2025.   Tetanus 2016 covid 2021 RSV vaccine d/w pt  Colonoscopy 2016 Prostate cancer screening 2025 Advance directive-wife designated if patient were incapacitated. AAA screening neg 2020 Prev lung cancer screening d/w pt.   He is going to see pulmonary and I'll defer.    Taking naproxen for his back pain at baseline.  He can manage with that.  All of his grandkids are playing sports, d/w pt.    He had L shoulder surgery for rotator cuff surgery.  He has done a lot of PT. he is making progress but not fully back to baseline.  H/o CAD. Statin intolerant.  Had seen cardiology, Dr. Juliann Pares. No CP.  Not SOB.    H/o diaphragmatic hernia.  No known issues.    H/o renal stones. On indapamide.  Has urology f/u pending.  Prev Ct with nonobstructive calculi measuring up to 4 mm in the upper pole collecting system of the right kidney. No recent stone passage.    H/o thyroid nodule.  D/w pt about consider repeat ultrasound in 2 years to make sure it has not changed in another 2-year interval, ie in ~10/2024.  TSH wnl.  Had seen pulmonary with plan for f/u this  year.    Meds, vitals, and allergies reviewed.   ROS: Per HPI unless specifically indicated in ROS section   GEN: nad, alert and oriented HEENT: ncat NECK: supple w/o LA CV: rrr.  PULM: ctab, no inc wob ABD: soft, +bs EXT: no edema SKIN: no acute rash Left shoulder with discomfort on range of motion.  He does not have full arm extension yet.  No arm drop.  35 minutes were devoted to patient care in this encounter (this includes time spent reviewing the patient's file/history, interviewing and examining the patient, counseling/reviewing plan with patient).

## 2023-07-30 NOTE — Patient Instructions (Addendum)
 Ask Dr. Juliann Pares about nonstatin options.   I would get a flu shot each fall.   PNA shot today.  Take care.  Glad to see you.

## 2023-07-31 NOTE — Addendum Note (Signed)
 Addended by: Joaquim Nam on: 07/31/2023 08:28 PM   Modules accepted: Level of Service

## 2023-07-31 NOTE — Assessment & Plan Note (Signed)
 H/o thyroid nodule.  D/w pt about consider repeat ultrasound in 2 years to make sure it has not changed in another 2-year interval, ie in ~10/2024.  TSH wnl.

## 2023-07-31 NOTE — Assessment & Plan Note (Signed)
 Advance directive- wife designated if patient were incapacitated.

## 2023-07-31 NOTE — Assessment & Plan Note (Signed)
 History of.  Continue indapamide.

## 2023-07-31 NOTE — Assessment & Plan Note (Signed)
 Flu d/w pt. Shingles discussed with patient PNA 2025.   Tetanus 2016 covid 2021 RSV vaccine d/w pt  Colonoscopy 2016 Prostate cancer screening 2025 Advance directive-wife designated if patient were incapacitated. AAA screening neg 2020 Prev lung cancer screening d/w pt.   He is going to see pulmonary and I'll defer.

## 2023-07-31 NOTE — Assessment & Plan Note (Signed)
Continue naproxen as needed 

## 2023-07-31 NOTE — Assessment & Plan Note (Signed)
 Discussed having him check with cardiology about nonstatin medications.  Not having chest pain.  Okay for outpatient follow-up.

## 2023-07-31 NOTE — Assessment & Plan Note (Signed)
 His range of motion is not completely back to baseline.  He has done a lot with PT and he is going to continue with that.

## 2023-10-15 ENCOUNTER — Encounter: Payer: Self-pay | Admitting: Family Medicine

## 2023-10-15 ENCOUNTER — Ambulatory Visit (INDEPENDENT_AMBULATORY_CARE_PROVIDER_SITE_OTHER): Admitting: Family Medicine

## 2023-10-15 VITALS — BP 124/82 | HR 82 | Temp 98.7°F | Ht 73.5 in | Wt 218.4 lb

## 2023-10-15 DIAGNOSIS — J0100 Acute maxillary sinusitis, unspecified: Secondary | ICD-10-CM

## 2023-10-15 MED ORDER — AMOXICILLIN-POT CLAVULANATE 875-125 MG PO TABS: 1.0000 | ORAL_TABLET | Freq: Two times a day (BID) | ORAL | 0 refills | Status: DC

## 2023-10-15 NOTE — Progress Notes (Unsigned)
 Sick since last week.  No vomiting.  Sinus pressure. Greens sputum and drainage.  No ear pain but prev with L ear pain.  ST better now.  Prev had voice changes.  Some prev wheeze, better now.  No rash.    He is still putting up with his shoulder pain.  Taking naproxen  at baseline.  Nsaid cautions d/w pt.  He has been working part time/light duty in the meantime.    Meds, vitals, and allergies reviewed.   ROS: Per HPI unless specifically indicated in ROS section   GEN: nad, alert and oriented HEENT: mucous membranes moist, tm w/o erythema, nasal exam w/o erythema, clear discharge noted,  OP with cobblestoning NECK: supple w/o LA CV: rrr.   PULM: ctab, no inc wob EXT: no edema SKIN: well perfused.

## 2023-10-15 NOTE — Patient Instructions (Addendum)
Start augmentin.  Rest and fluids.  Update me as needed.  Take care.  Glad to see you.

## 2023-10-16 NOTE — Assessment & Plan Note (Signed)
 Start augmentin . Rest and fluids.  Update me as needed.  Okay for outpatient f/u.

## 2023-11-05 ENCOUNTER — Other Ambulatory Visit: Payer: Self-pay | Admitting: *Deleted

## 2023-11-05 DIAGNOSIS — N200 Calculus of kidney: Secondary | ICD-10-CM

## 2023-11-14 DIAGNOSIS — R5383 Other fatigue: Secondary | ICD-10-CM | POA: Diagnosis not present

## 2023-11-14 DIAGNOSIS — Z8679 Personal history of other diseases of the circulatory system: Secondary | ICD-10-CM | POA: Diagnosis not present

## 2023-11-14 DIAGNOSIS — Z87891 Personal history of nicotine dependence: Secondary | ICD-10-CM | POA: Diagnosis not present

## 2023-11-14 DIAGNOSIS — E785 Hyperlipidemia, unspecified: Secondary | ICD-10-CM | POA: Diagnosis not present

## 2023-11-14 DIAGNOSIS — R001 Bradycardia, unspecified: Secondary | ICD-10-CM | POA: Diagnosis not present

## 2023-11-14 DIAGNOSIS — I2510 Atherosclerotic heart disease of native coronary artery without angina pectoris: Secondary | ICD-10-CM | POA: Diagnosis not present

## 2023-11-21 ENCOUNTER — Ambulatory Visit
Admission: RE | Admit: 2023-11-21 | Discharge: 2023-11-21 | Disposition: A | Discharge status: Home / Self Care | Source: Ambulatory Visit | Attending: Acute Care | Admitting: Acute Care

## 2023-11-21 DIAGNOSIS — Z87891 Personal history of nicotine dependence: Secondary | ICD-10-CM | POA: Diagnosis not present

## 2023-11-21 DIAGNOSIS — Z122 Encounter for screening for malignant neoplasm of respiratory organs: Secondary | ICD-10-CM | POA: Diagnosis not present

## 2023-12-02 ENCOUNTER — Other Ambulatory Visit: Payer: Self-pay

## 2023-12-02 DIAGNOSIS — Z122 Encounter for screening for malignant neoplasm of respiratory organs: Secondary | ICD-10-CM

## 2023-12-02 DIAGNOSIS — Z87891 Personal history of nicotine dependence: Secondary | ICD-10-CM

## 2023-12-04 ENCOUNTER — Ambulatory Visit
Admission: RE | Admit: 2023-12-04 | Discharge: 2023-12-04 | Disposition: A | Discharge status: Home / Self Care | Source: Ambulatory Visit | Attending: Urology | Admitting: Urology

## 2023-12-04 DIAGNOSIS — R109 Unspecified abdominal pain: Secondary | ICD-10-CM | POA: Diagnosis not present

## 2023-12-04 DIAGNOSIS — N200 Calculus of kidney: Secondary | ICD-10-CM

## 2023-12-13 ENCOUNTER — Encounter: Payer: Self-pay | Admitting: Urology

## 2023-12-13 ENCOUNTER — Other Ambulatory Visit: Payer: Self-pay | Admitting: Urology

## 2023-12-13 ENCOUNTER — Ambulatory Visit: Payer: Self-pay | Admitting: Urology

## 2023-12-13 VITALS — BP 131/84 | HR 60 | Ht 73.5 in | Wt 206.0 lb

## 2023-12-13 DIAGNOSIS — R82994 Hypercalciuria: Secondary | ICD-10-CM

## 2023-12-13 DIAGNOSIS — N200 Calculus of kidney: Secondary | ICD-10-CM | POA: Diagnosis not present

## 2023-12-13 DIAGNOSIS — N401 Enlarged prostate with lower urinary tract symptoms: Secondary | ICD-10-CM | POA: Diagnosis not present

## 2023-12-13 DIAGNOSIS — R350 Frequency of micturition: Secondary | ICD-10-CM

## 2023-12-13 MED ORDER — OXYCODONE-ACETAMINOPHEN 5-325 MG PO TABS: 1.0000 | ORAL_TABLET | Freq: Four times a day (QID) | ORAL | 0 refills | Status: AC | PRN

## 2023-12-13 MED ORDER — INDAPAMIDE 2.5 MG PO TABS: 2.5000 mg | ORAL_TABLET | Freq: Every day | ORAL | 3 refills | Status: AC

## 2023-12-13 NOTE — Progress Notes (Signed)
 12/13/2023 11:11 AM   Arley ONEIDA Boon Oct 22, 1955 [id number]  Referring provider: Cleatus Arlyss RAMAN, MD [address] [place],  [place] [address]  Chief Complaint  Patient presents with   Nephrolithiasis    Urologic history:  1.  Nephrolithiasis Ureteroscopic removal of 10 mm right proximal ureteral calculus, 5 mm right lower pole calculus and bladder calculi X3 on 03/01/20 Metabolic evaluation remarkable for hypercalciuria at 382 mg /24 hours Started indapamide  December 2021   HPI: DARREN CALDRON is a 68 y.o. male who presents for annual follow-up.  Send last year's visit has noted some increased frequency and occasional sensation of incomplete emptying Symptoms are not bothersome enough that he desires to start medication PSA 07/23/2023 stable 2.75   PMH: Past Medical History:  Diagnosis Date   Arthritis    LEFT SHOULDER   Back pain, chronic    after fall from ladder in 1980s with episodic flares   BPH (benign prostatic hyperplasia)    Diverticulitis 10/2010   divertics one small polyp (Dr. Aneita)   GERD (gastroesophageal reflux disease)    RARE   History of kidney stones    Hyperglycemia    Hyperlipidemia    Hypertension    H/O   IBS (irritable bowel syndrome)    diarrhea predominant   Personal history of kidney stones 02/07/2005   CT ABD/pelvis --Diverticulitis sig left renal stone // CT ABD w/o negative stones + multiple aortic L.N. 07/24/2004//    Surgical History: Past Surgical History:  Procedure Laterality Date   CHOLECYSTECTOMY     CYSTOSCOPY W/ RETROGRADES Right 03/01/2020   Procedure: CYSTOSCOPY WITH RETROGRADE PYELOGRAM;  Surgeon: Twylla Glendia BROCKS, MD;  Location: [facility];  Service: Urology;  Laterality: Right;   CYSTOSCOPY WITH LITHOLAPAXY N/A 03/01/2020   Procedure: CYSTOSCOPY WITH LITHOLAPAXY;  Surgeon: Twylla Glendia BROCKS, MD;  Location: [facility];  Service: Urology;  Laterality: N/A;   CYSTOSCOPY/URETEROSCOPY/HOLMIUM  LASER/STENT PLACEMENT Right 03/01/2020   Procedure: CYSTOSCOPY/URETEROSCOPY/HOLMIUM LASER/STENT PLACEMENT;  Surgeon: Twylla Glendia BROCKS, MD;  Location: [facility];  Service: Urology;  Laterality: Right;   LAPAROSCOPIC CHOLECYSTECTOMY W/ CHOLANGIOGRAPHY  12/27/2008   Ct abd/pelvis w/o no gallstones mild dilation entire right ureter lap choley (Dr. Dessa)   VASECTOMY  1990   XI ROBOTIC ASSISTED PARAESOPHAGEAL HERNIA REPAIR N/A 09/03/2019   Procedure: XI ROBOTIC ASSISTED Diaphragmatic hernia REPAIR;  Surgeon: Jordis Laneta FALCON, MD;  Location: [facility];  Service: General;  Laterality: N/A;   XI ROBOTIC ASSISTED VENTRAL HERNIA N/A 02/04/2020   Procedure: XI ROBOTIC ASSISTED VENTRAL HERNIA;  Surgeon: Jordis Laneta FALCON, MD;  Location: [facility];  Service: General;  Laterality: N/A;    Home Medications:  Allergies as of 12/13/2023       Reactions   Atorvastatin     aches   Chantix [varenicline]    Abnormal dreams   Hydrocodone-acetaminophen  Other (See Comments)   edgy, intolerant   Lisinopril  Other (See Comments)   cough   Pravastatin     Aches        Medication List        Accurate as of December 13, 2023 11:11 AM. If you have any questions, ask your nurse or doctor.          amoxicillin -clavulanate 875-125 MG tablet Commonly known as: AUGMENTIN  Take 1 tablet by mouth 2 (two) times daily.   cyanocobalamin 1000 MCG tablet Commonly known as: VITAMIN B12 Take 1,000 mcg by mouth daily.   indapamide  2.5 MG tablet Commonly known  as: LOZOL  TAKE 1 TABLET BY MOUTH EVERY DAY   naproxen  250 MG tablet Commonly known as: NAPROSYN  Take 250 mg by mouth daily.   psyllium 58.6 % packet Commonly known as: METAMUCIL Take 1 packet by mouth daily.   Turmeric 500 MG Caps Take 1,000 mg by mouth.        Allergies:  Allergies  Allergen Reactions   Atorvastatin      aches   Chantix [Varenicline]     Abnormal dreams   Hydrocodone-Acetaminophen  Other (See Comments)    edgy, intolerant    Lisinopril  Other (See Comments)    cough   Pravastatin      Aches    Family History: Family History  Problem Relation Age of Onset   Heart disease Father 70       MI   Kidney disease Father    Depression Maternal Grandmother    Alcohol abuse Neg Hx    Stroke Neg Hx    Prostate cancer Neg Hx    Colon cancer Neg Hx     Social History:  reports that he quit smoking about 8 years ago. His smoking use included cigarettes. He started smoking about 38 years ago. He has a 30 pack-year smoking history. He has quit using smokeless tobacco.  His smokeless tobacco use included chew. He reports current alcohol use of about 1.0 standard drink of alcohol per week. He reports that he does not use drugs.   Physical Exam: BP 131/84   Pulse 60   Ht 6' 1.5 (1.867 m)   Wt 206 lb (93.4 kg)   SpO2 98%   BMI 26.81 kg/m   Constitutional:  Alert and oriented, No acute distress. HEENT: [place] AT Respiratory: Normal respiratory effort, no increased work of breathing. Psychiatric: Normal mood and affect.    Pertinent Imaging: KUB performed earlier today was personally reviewed and interpreted.  No calcifications overlying the renal outlines or expected course of the ureters that are suspicious for urinary tract stones  Lung cancer screening CT was reviewed and small calculus right upper pole.  Only the upper one half of the left kidney is identified and no calculi are seen   Assessment & Plan:    1.  Nephrolithiasis Stable Follow-up 1 year KUB every other year  2.  Hypercalciuria Indapamide  refilled  3.  BPH with urinary frequency Symptoms not bothersome enough that he desires medical management    Glendia JAYSON Barba, MD  [facility] [address], [address] [place], [place] [address] [phone]

## 2024-02-27 DIAGNOSIS — E785 Hyperlipidemia, unspecified: Secondary | ICD-10-CM | POA: Diagnosis not present

## 2024-02-27 DIAGNOSIS — Z8679 Personal history of other diseases of the circulatory system: Secondary | ICD-10-CM | POA: Diagnosis not present

## 2024-02-27 DIAGNOSIS — Z87891 Personal history of nicotine dependence: Secondary | ICD-10-CM | POA: Diagnosis not present

## 2024-02-27 DIAGNOSIS — R001 Bradycardia, unspecified: Secondary | ICD-10-CM | POA: Diagnosis not present

## 2024-02-27 DIAGNOSIS — R5383 Other fatigue: Secondary | ICD-10-CM | POA: Diagnosis not present

## 2024-02-27 DIAGNOSIS — I2510 Atherosclerotic heart disease of native coronary artery without angina pectoris: Secondary | ICD-10-CM | POA: Diagnosis not present

## 2024-04-06 ENCOUNTER — Ambulatory Visit: Payer: Self-pay

## 2024-04-06 ENCOUNTER — Other Ambulatory Visit: Payer: Self-pay | Admitting: Family Medicine

## 2024-04-06 MED ORDER — AMOXICILLIN-POT CLAVULANATE 875-125 MG PO TABS: 1.0000 | ORAL_TABLET | Freq: Two times a day (BID) | ORAL | 0 refills | Status: AC

## 2024-04-06 NOTE — Telephone Encounter (Signed)
Informed patient Rx was sent into pharmacy

## 2024-04-06 NOTE — Telephone Encounter (Addendum)
 Sent rx for augmentin  to [id number] - [place], [place] - [address]  Please have him follow up if not improving. Thanks.

## 2024-04-06 NOTE — Telephone Encounter (Signed)
 I called pt to offer him an appt at 3pm with Dr Bennett today and pt declined. Said he only wants to deal with Dr Cleatus.

## 2024-04-06 NOTE — Telephone Encounter (Signed)
 FYI Only or Action Required?: Action required by provider: request for appointment.  Patient was last seen in primary care on 10/15/2023 by Stephen Arlyss RAMAN, MD.  Called Nurse Triage reporting Facial Pain.  Symptoms began several weeks ago.  Interventions attempted: Nothing.  Symptoms are: unchanged.Sinus pressure, green mucus x 2 weeks. Declined to see another provider. Requests to be worked in with Dr. Cleatus, or another antibiotic be called in, He's done that for me before. Please advise pt. Uses [facility] at [address].  Triage Disposition: See PCP When Office is Open (Within 3 Days)  Patient/caregiver understands and will follow disposition?: Yes      Copied from CRM #[id number]. Topic: Clinical - Red Word Triage >> Apr 06, 2024  1:36 PM Eva FALCON wrote: Red Word that prompted transfer to Nurse Triage: hoarseness, sinus issue, coughing up green mucus. Answer Assessment - Initial Assessment Questions 1. LOCATION: Where does it hurt?      Mild headache 2. ONSET: When did the sinus pain start?  (e.g., hours, days)      2 weeks 3. SEVERITY: How bad is the pain?   (Scale 0-10; or none, mild, moderate or severe)     mild 4. RECURRENT SYMPTOM: Have you ever had sinus problems before? If Yes, ask: When was the last time? and What happened that time?      yes 5. NASAL CONGESTION: Is the nose blocked? If Yes, ask: Can you open it or must you breathe through your mouth?     green 6. NASAL DISCHARGE: Do you have discharge from your nose? If so ask, What color?     green 7. FEVER: Do you have a fever? If Yes, ask: What is it, how was it measured, and when did it start?      no 8. OTHER SYMPTOMS: Do you have any other symptoms? (e.g., sore throat, cough, earache, difficulty breathing)     no 9. PREGNANCY: Is there any chance you are pregnant? When was your last menstrual period?     N/a  Protocols used: Sinus Pain or Congestion-A-AH  Reason for Disposition   [1] Sinus congestion (pressure, fullness) AND [2] present > 10 days  Answer Assessment - Initial Assessment Questions 1. LOCATION: Where does it hurt?      Mild headache 2. ONSET: When did the sinus pain start?  (e.g., hours, days)      2 weeks 3. SEVERITY: How bad is the pain?   (Scale 0-10; or none, mild, moderate or severe)     mild 4. RECURRENT SYMPTOM: Have you ever had sinus problems before? If Yes, ask: When was the last time? and What happened that time?      yes 5. NASAL CONGESTION: Is the nose blocked? If Yes, ask: Can you open it or must you breathe through your mouth?     green 6. NASAL DISCHARGE: Do you have discharge from your nose? If so ask, What color?     green 7. FEVER: Do you have a fever? If Yes, ask: What is it, how was it measured, and when did it start?      no 8. OTHER SYMPTOMS: Do you have any other symptoms? (e.g., sore throat, cough, earache, difficulty breathing)     no 9. PREGNANCY: Is there any chance you are pregnant? When was your last menstrual period?     N/a  Protocols used: Sinus Pain or Congestion-A-AH

## 2024-04-18 ENCOUNTER — Other Ambulatory Visit: Payer: Self-pay | Admitting: Urology

## 2024-07-23 ENCOUNTER — Other Ambulatory Visit

## 2024-07-30 ENCOUNTER — Encounter: Admitting: Family Medicine

## 2024-12-11 ENCOUNTER — Ambulatory Visit: Admitting: Urology
